# Patient Record
Sex: Female | Born: 1935 | Race: White | Hispanic: No | Marital: Married | Smoking: Never smoker
Health system: Southern US, Community
[De-identification: ages and names within clinical notes are randomized; demographics above are authoritative.]

## PROBLEM LIST (undated history)

## (undated) DIAGNOSIS — R32 Unspecified urinary incontinence: Secondary | ICD-10-CM

## (undated) DIAGNOSIS — R112 Nausea with vomiting, unspecified: Secondary | ICD-10-CM

## (undated) DIAGNOSIS — I10 Essential (primary) hypertension: Secondary | ICD-10-CM

## (undated) DIAGNOSIS — F419 Anxiety disorder, unspecified: Secondary | ICD-10-CM

## (undated) DIAGNOSIS — E039 Hypothyroidism, unspecified: Secondary | ICD-10-CM

## (undated) DIAGNOSIS — K274 Chronic or unspecified peptic ulcer, site unspecified, with hemorrhage: Secondary | ICD-10-CM

## (undated) DIAGNOSIS — Z9289 Personal history of other medical treatment: Secondary | ICD-10-CM

## (undated) DIAGNOSIS — G709 Myoneural disorder, unspecified: Secondary | ICD-10-CM

## (undated) DIAGNOSIS — M1990 Unspecified osteoarthritis, unspecified site: Secondary | ICD-10-CM

## (undated) DIAGNOSIS — N189 Chronic kidney disease, unspecified: Secondary | ICD-10-CM

## (undated) DIAGNOSIS — F329 Major depressive disorder, single episode, unspecified: Secondary | ICD-10-CM

## (undated) DIAGNOSIS — Z9889 Other specified postprocedural states: Secondary | ICD-10-CM

## (undated) DIAGNOSIS — F32A Depression, unspecified: Secondary | ICD-10-CM

## (undated) HISTORY — PX: NASAL POLYP EXCISION: SHX2068

## (undated) HISTORY — PX: JOINT REPLACEMENT: SHX530

## (undated) HISTORY — PX: SHOULDER SURGERY: SHX246

## (undated) HISTORY — PX: TONSILLECTOMY AND ADENOIDECTOMY: SUR1326

---

## 1998-04-15 ENCOUNTER — Ambulatory Visit (HOSPITAL_COMMUNITY): Admission: RE | Admit: 1998-04-15 | Discharge: 1998-04-15 | Payer: Self-pay | Admitting: *Deleted

## 1999-05-18 ENCOUNTER — Encounter: Payer: Self-pay | Admitting: *Deleted

## 1999-05-18 ENCOUNTER — Ambulatory Visit (HOSPITAL_COMMUNITY): Admission: RE | Admit: 1999-05-18 | Discharge: 1999-05-18 | Payer: Self-pay | Admitting: *Deleted

## 2000-05-21 ENCOUNTER — Encounter: Payer: Self-pay | Admitting: Family Medicine

## 2000-05-21 ENCOUNTER — Ambulatory Visit (HOSPITAL_COMMUNITY): Admission: RE | Admit: 2000-05-21 | Discharge: 2000-05-21 | Payer: Self-pay | Admitting: Family Medicine

## 2001-05-28 ENCOUNTER — Ambulatory Visit (HOSPITAL_COMMUNITY): Admission: RE | Admit: 2001-05-28 | Discharge: 2001-05-28 | Payer: Self-pay | Admitting: Family Medicine

## 2001-05-28 ENCOUNTER — Encounter: Payer: Self-pay | Admitting: Family Medicine

## 2001-09-30 ENCOUNTER — Other Ambulatory Visit: Admission: RE | Admit: 2001-09-30 | Discharge: 2001-09-30 | Payer: Self-pay | Admitting: Family Medicine

## 2003-01-19 LAB — HM COLONOSCOPY

## 2005-08-08 ENCOUNTER — Ambulatory Visit: Payer: Self-pay | Admitting: Family Medicine

## 2006-08-16 ENCOUNTER — Ambulatory Visit: Payer: Self-pay | Admitting: Family Medicine

## 2007-08-22 ENCOUNTER — Ambulatory Visit: Payer: Self-pay | Admitting: Family Medicine

## 2007-10-24 DIAGNOSIS — Z9289 Personal history of other medical treatment: Secondary | ICD-10-CM

## 2007-10-24 HISTORY — DX: Personal history of other medical treatment: Z92.89

## 2008-08-26 ENCOUNTER — Ambulatory Visit: Payer: Self-pay | Admitting: Family Medicine

## 2008-09-27 ENCOUNTER — Emergency Department: Payer: Self-pay | Admitting: Emergency Medicine

## 2008-10-12 ENCOUNTER — Ambulatory Visit: Payer: Self-pay | Admitting: Family Medicine

## 2008-10-13 ENCOUNTER — Inpatient Hospital Stay: Payer: Self-pay | Admitting: *Deleted

## 2008-10-13 DIAGNOSIS — K274 Chronic or unspecified peptic ulcer, site unspecified, with hemorrhage: Secondary | ICD-10-CM

## 2008-10-13 HISTORY — DX: Chronic or unspecified peptic ulcer, site unspecified, with hemorrhage: K27.4

## 2009-07-22 LAB — HM PAP SMEAR: HM Pap smear: NEGATIVE

## 2009-09-02 ENCOUNTER — Ambulatory Visit: Payer: Self-pay | Admitting: Family Medicine

## 2010-09-27 ENCOUNTER — Ambulatory Visit: Payer: Self-pay | Admitting: Family Medicine

## 2011-09-24 ENCOUNTER — Emergency Department: Payer: Self-pay | Admitting: Emergency Medicine

## 2011-10-31 ENCOUNTER — Ambulatory Visit: Payer: Self-pay | Admitting: Family Medicine

## 2012-07-03 ENCOUNTER — Encounter (HOSPITAL_COMMUNITY)
Admission: RE | Admit: 2012-07-03 | Discharge: 2012-07-03 | Disposition: A | Discharge status: Home / Self Care | Payer: Medicare Other | Source: Ambulatory Visit | Attending: Orthopedic Surgery | Admitting: Orthopedic Surgery

## 2012-07-03 ENCOUNTER — Encounter (HOSPITAL_COMMUNITY): Payer: Self-pay

## 2012-07-03 DIAGNOSIS — K449 Diaphragmatic hernia without obstruction or gangrene: Secondary | ICD-10-CM | POA: Insufficient documentation

## 2012-07-03 DIAGNOSIS — Z01818 Encounter for other preprocedural examination: Secondary | ICD-10-CM | POA: Insufficient documentation

## 2012-07-03 DIAGNOSIS — Z01812 Encounter for preprocedural laboratory examination: Secondary | ICD-10-CM | POA: Insufficient documentation

## 2012-07-03 DIAGNOSIS — Z01810 Encounter for preprocedural cardiovascular examination: Secondary | ICD-10-CM | POA: Insufficient documentation

## 2012-07-03 HISTORY — DX: Hypothyroidism, unspecified: E03.9

## 2012-07-03 HISTORY — DX: Myoneural disorder, unspecified: G70.9

## 2012-07-03 HISTORY — DX: Essential (primary) hypertension: I10

## 2012-07-03 HISTORY — DX: Nausea with vomiting, unspecified: R11.2

## 2012-07-03 HISTORY — DX: Other specified postprocedural states: Z98.890

## 2012-07-03 HISTORY — DX: Chronic or unspecified peptic ulcer, site unspecified, with hemorrhage: K27.4

## 2012-07-03 LAB — CBC
Hemoglobin: 15.5 g/dL — ABNORMAL HIGH (ref 12.0–15.0)
MCH: 30.3 pg (ref 26.0–34.0)
RBC: 5.12 MIL/uL — ABNORMAL HIGH (ref 3.87–5.11)
WBC: 11.1 10*3/uL — ABNORMAL HIGH (ref 4.0–10.5)

## 2012-07-03 LAB — BASIC METABOLIC PANEL
GFR calc Af Amer: 63 mL/min — ABNORMAL LOW (ref >90)
GFR calc non Af Amer: 55 mL/min — ABNORMAL LOW (ref >90)
Glucose, Bld: 88 mg/dL (ref 70–99)
Potassium: 4.9 mEq/L (ref 3.5–5.1)
Sodium: 139 mEq/L (ref 135–145)

## 2012-07-03 NOTE — Pre-Procedure Instructions (Signed)
20 [name]  07/03/2012   Your procedure is scheduled on:  Thursday July 11, 2012 at 0730 am  Report to [facility] at 0530 AM.  Call this number if you have problems the morning of surgery: [phone]   Remember:   Do not eat food or drink:After Midnight.Wednesday      Take these medicines the morning of surgery with A SIP OF WATER: Metoprolol   Do not wear jewelry, make-up or nail polish.  Do not wear lotions, powders, or perfumes. You may wear deodorant.  Do not shave 48 hours prior to surgery. Men may shave face and neck.  Do not bring valuables to the hospital.  Contacts, dentures or bridgework may not be worn into surgery.  Leave suitcase in the car. After surgery it may be brought to your room.  For patients admitted to the hospital, checkout time is 11:00 AM the day of discharge.   Patients discharged the day of surgery will not be allowed to drive home.    Special Instructions: Incentive Spirometry - Practice and bring it with you on the day of surgery. and CHG Shower Use Special Wash: 1/2 bottle night before surgery and 1/2 bottle morning of surgery.   Please read over the following fact sheets that you were given: Pain Booklet, Coughing and Deep Breathing, MRSA Information and Surgical Site Infection Prevention

## 2012-07-03 NOTE — Progress Notes (Signed)
Ralene Bathe, PA at Dr Rennis Chris office made aware that pt's daughter committed suicide last night ,she will not be coming by MD's office today as planned. French Ana PA stated would follow-up this PM with pt.

## 2012-07-03 NOTE — Progress Notes (Signed)
[name] Ana Shuford PA made aware of lump under pt's left arm. Stated to have pt  come to Dr. Rennis Chris office after [facility] visit.

## 2012-07-04 ENCOUNTER — Encounter (HOSPITAL_COMMUNITY): Payer: Self-pay | Admitting: Vascular Surgery

## 2012-07-11 ENCOUNTER — Encounter (HOSPITAL_COMMUNITY): Admission: RE | Payer: Self-pay | Source: Ambulatory Visit

## 2012-07-11 ENCOUNTER — Inpatient Hospital Stay (HOSPITAL_COMMUNITY): Admission: RE | Admit: 2012-07-11 | Payer: Medicare Other | Source: Ambulatory Visit | Admitting: Orthopedic Surgery

## 2012-07-11 SURGERY — ARTHROPLASTY, SHOULDER, TOTAL, REVERSE
Anesthesia: General | Site: Shoulder | Laterality: Left

## 2012-07-25 ENCOUNTER — Encounter (HOSPITAL_COMMUNITY): Payer: Self-pay | Admitting: Pharmacy Technician

## 2012-07-30 ENCOUNTER — Encounter (HOSPITAL_COMMUNITY): Payer: Self-pay

## 2012-07-30 ENCOUNTER — Encounter (HOSPITAL_COMMUNITY)
Admission: RE | Admit: 2012-07-30 | Discharge: 2012-07-30 | Disposition: A | Discharge status: Home / Self Care | Payer: Medicare Other | Source: Ambulatory Visit | Attending: Orthopedic Surgery | Admitting: Orthopedic Surgery

## 2012-07-30 HISTORY — DX: Unspecified osteoarthritis, unspecified site: M19.90

## 2012-07-30 NOTE — Progress Notes (Addendum)
Patients surgery rescheduled to 08/15/12 due to Dr. Rennis Chris not being available.  History updated 07/30/12, labs to be done day of surgery. Forwarded chart to anesthesia to review CXR from 07/03/12.

## 2012-07-30 NOTE — Consult Note (Signed)
Anesthesia Chart Review:  Patient is a 78 year old female posted for left reverse shoulder arthroplasty on 08/15/12 by Dr. Rennis Chris.   She was initially scheduled for this procedure, but was rescheduled because her daughter committed suicide.    History includes non-smoker, PUD '09, HTN, DM2 "borderline" not yet requiring medication, PNA, GERD, OA, headaches, hypothyroidism not yet requiring medication.  PCP is Dr. Lorie Phenix.  CXR on 07/03/12 showed: No acute infiltrate or pulmonary edema. Multiple calcified hilar and mediastinal lymph nodes are noted. Degenerative changes bilateral shoulders. Small to moderate sized hiatal hernia. Mild degenerative changes mid thoracic spine.   EKG on 07/03/12 showed NSR.  Her surgery was rescheduled from 08/08/12 to 08/15/12, but labs could not be done at her PAT appointment and still be within 14 days.  We will need to see if she can come in for a lab only visit unless Dr. Rennis Chris is okay with her getting labs on the day of surgery.  I'll ask our schedulers to clarify with Dr. Dub Mikes office tomorrow, since they are now closed.  Shonna Chock, PA-C 07/30/12 1742

## 2012-07-30 NOTE — Pre-Procedure Instructions (Signed)
20 [name]  07/30/2012   Your procedure is scheduled on:  Thursday August 08, 2012  Report to [facility] at 8:30 AM.  Call this number if you have problems the morning of surgery: [phone]   Remember:   Do not eat food or drink After Midnight.      Take these medicines the morning of surgery with A SIP OF WATER: metoprolol   Do not wear jewelry, make-up or nail polish.  Do not wear lotions, powders, or perfumes.   Do not shave 48 hours prior to surgery. Men may shave face and neck.  Do not bring valuables to the hospital.  Contacts, dentures or bridgework may not be worn into surgery.  Leave suitcase in the car. After surgery it may be brought to your room.  For patients admitted to the hospital, checkout time is 11:00 AM the day of discharge.   Patients discharged the day of surgery will not be allowed to drive home.  Name and phone number of your driver: family / friend  Special Instructions: Shower using CHG 2 nights before surgery and the night before surgery.  If you shower the day of surgery use CHG.  Use special wash - you have one bottle of CHG for all showers.  You should use approximately 1/3 of the bottle for each shower.   Please read over the following fact sheets that you were given: Pain Booklet, Coughing and Deep Breathing, Blood Transfusion Information, MRSA Information and Surgical Site Infection Prevention

## 2012-08-14 MED ORDER — DEXTROSE 5 % IV SOLN
3.0000 g | INTRAVENOUS | Status: DC
  Filled 2012-08-14: qty 3000

## 2012-08-14 MED ORDER — CEFAZOLIN SODIUM-DEXTROSE 2-3 GM-% IV SOLR
2.0000 g | INTRAVENOUS | Status: DC
  Filled 2012-08-14 (×2): qty 50

## 2012-08-15 ENCOUNTER — Inpatient Hospital Stay (HOSPITAL_COMMUNITY): Payer: Medicare Other | Admitting: Vascular Surgery

## 2012-08-15 ENCOUNTER — Encounter (HOSPITAL_COMMUNITY): Payer: Self-pay | Admitting: Vascular Surgery

## 2012-08-15 ENCOUNTER — Encounter (HOSPITAL_COMMUNITY): Payer: Self-pay | Admitting: *Deleted

## 2012-08-15 ENCOUNTER — Inpatient Hospital Stay (HOSPITAL_COMMUNITY)
Admission: RE | Admit: 2012-08-15 | Discharge: 2012-08-16 | DRG: 483 | Disposition: A | Discharge status: Home / Self Care | Payer: Medicare Other | Source: Ambulatory Visit | Attending: Orthopedic Surgery | Admitting: Orthopedic Surgery

## 2012-08-15 ENCOUNTER — Inpatient Hospital Stay (HOSPITAL_COMMUNITY): Payer: Medicare Other

## 2012-08-15 ENCOUNTER — Encounter (HOSPITAL_COMMUNITY)
Admission: RE | Disposition: A | Discharge status: Home / Self Care | Payer: Self-pay | Source: Ambulatory Visit | Attending: Orthopedic Surgery

## 2012-08-15 DIAGNOSIS — M67919 Unspecified disorder of synovium and tendon, unspecified shoulder: Secondary | ICD-10-CM | POA: Diagnosis present

## 2012-08-15 DIAGNOSIS — S42309S Unspecified fracture of shaft of humerus, unspecified arm, sequela: Secondary | ICD-10-CM

## 2012-08-15 DIAGNOSIS — M719 Bursopathy, unspecified: Secondary | ICD-10-CM | POA: Diagnosis present

## 2012-08-15 DIAGNOSIS — I10 Essential (primary) hypertension: Secondary | ICD-10-CM | POA: Diagnosis present

## 2012-08-15 DIAGNOSIS — M19019 Primary osteoarthritis, unspecified shoulder: Principal | ICD-10-CM | POA: Diagnosis present

## 2012-08-15 DIAGNOSIS — E119 Type 2 diabetes mellitus without complications: Secondary | ICD-10-CM | POA: Diagnosis present

## 2012-08-15 DIAGNOSIS — Z23 Encounter for immunization: Secondary | ICD-10-CM

## 2012-08-15 DIAGNOSIS — K219 Gastro-esophageal reflux disease without esophagitis: Secondary | ICD-10-CM | POA: Diagnosis present

## 2012-08-15 DIAGNOSIS — IMO0002 Reserved for concepts with insufficient information to code with codable children: Secondary | ICD-10-CM | POA: Diagnosis present

## 2012-08-15 DIAGNOSIS — X58XXXS Exposure to other specified factors, sequela: Secondary | ICD-10-CM

## 2012-08-15 DIAGNOSIS — E039 Hypothyroidism, unspecified: Secondary | ICD-10-CM | POA: Diagnosis present

## 2012-08-15 DIAGNOSIS — Z79899 Other long term (current) drug therapy: Secondary | ICD-10-CM

## 2012-08-15 HISTORY — PX: REVERSE SHOULDER ARTHROPLASTY: SHX5054

## 2012-08-15 HISTORY — DX: Personal history of other medical treatment: Z92.89

## 2012-08-15 LAB — CBC
MCH: 30.3 pg (ref 26.0–34.0)
Platelets: 263 10*3/uL (ref 150–400)
RBC: 5.22 MIL/uL — ABNORMAL HIGH (ref 3.87–5.11)
RDW: 13.3 % (ref 11.5–15.5)
WBC: 8.9 10*3/uL (ref 4.0–10.5)

## 2012-08-15 LAB — BASIC METABOLIC PANEL
Calcium: 9.7 mg/dL (ref 8.4–10.5)
GFR calc non Af Amer: 80 mL/min — ABNORMAL LOW (ref >90)
Sodium: 137 mEq/L (ref 135–145)

## 2012-08-15 LAB — TYPE AND SCREEN: ABO/RH(D): A POS

## 2012-08-15 SURGERY — ARTHROPLASTY, SHOULDER, TOTAL, REVERSE
Anesthesia: Regional | Site: Shoulder | Laterality: Left | Wound class: Clean

## 2012-08-15 MED ORDER — LIDOCAINE HCL (CARDIAC) 20 MG/ML IV SOLN
INTRAVENOUS | Status: DC | PRN
  Administered 2012-08-15: 70 mg via INTRAVENOUS

## 2012-08-15 MED ORDER — ALUM & MAG HYDROXIDE-SIMETH 200-200-20 MG/5ML PO SUSP: 30.0000 mL | ORAL | Status: DC | PRN

## 2012-08-15 MED ORDER — MUPIROCIN 2 % EX OINT
TOPICAL_OINTMENT | CUTANEOUS | Status: AC
  Filled 2012-08-15: qty 22

## 2012-08-15 MED ORDER — ACETAMINOPHEN 650 MG RE SUPP: 650.0000 mg | Freq: Four times a day (QID) | RECTAL | Status: DC | PRN

## 2012-08-15 MED ORDER — LACTATED RINGERS IV SOLN
INTRAVENOUS | Status: DC
  Administered 2012-08-15: 17:00:00 via INTRAVENOUS

## 2012-08-15 MED ORDER — FENTANYL CITRATE 0.05 MG/ML IJ SOLN
50.0000 ug | INTRAMUSCULAR | Status: AC | PRN
  Administered 2012-08-15 (×2): 50 ug via INTRAVENOUS

## 2012-08-15 MED ORDER — ONDANSETRON HCL 4 MG/2ML IJ SOLN: 4.0000 mg | Freq: Four times a day (QID) | INTRAMUSCULAR | Status: DC | PRN

## 2012-08-15 MED ORDER — PHENOL 1.4 % MT LIQD: 1.0000 | OROMUCOSAL | Status: DC | PRN

## 2012-08-15 MED ORDER — HYDROCHLOROTHIAZIDE 25 MG PO TABS
25.0000 mg | ORAL_TABLET | Freq: Every day | ORAL | Status: DC
  Administered 2012-08-15 – 2012-08-16 (×2): 25 mg via ORAL
  Filled 2012-08-15 (×2): qty 1

## 2012-08-15 MED ORDER — HYDROMORPHONE HCL PF 1 MG/ML IJ SOLN: 0.5000 mg | INTRAMUSCULAR | Status: DC | PRN

## 2012-08-15 MED ORDER — MIDAZOLAM HCL 2 MG/2ML IJ SOLN
INTRAMUSCULAR | Status: AC
  Filled 2012-08-15: qty 2

## 2012-08-15 MED ORDER — DOCUSATE SODIUM 100 MG PO CAPS
100.0000 mg | ORAL_CAPSULE | Freq: Two times a day (BID) | ORAL | Status: DC
  Administered 2012-08-15 – 2012-08-16 (×2): 100 mg via ORAL
  Filled 2012-08-15 (×3): qty 1

## 2012-08-15 MED ORDER — SCOPOLAMINE 1 MG/3DAYS TD PT72
MEDICATED_PATCH | TRANSDERMAL | Status: AC
  Filled 2012-08-15: qty 1

## 2012-08-15 MED ORDER — METOCLOPRAMIDE HCL 5 MG/ML IJ SOLN: 5.0000 mg | Freq: Three times a day (TID) | INTRAMUSCULAR | Status: DC | PRN

## 2012-08-15 MED ORDER — 0.9 % SODIUM CHLORIDE (POUR BTL) OPTIME
TOPICAL | Status: DC | PRN
  Administered 2012-08-15: 1000 mL

## 2012-08-15 MED ORDER — EPHEDRINE SULFATE 50 MG/ML IJ SOLN
INTRAMUSCULAR | Status: DC | PRN
  Administered 2012-08-15: 5 mg via INTRAVENOUS
  Administered 2012-08-15: 10 mg via INTRAVENOUS

## 2012-08-15 MED ORDER — ZOLPIDEM TARTRATE 5 MG PO TABS
5.0000 mg | ORAL_TABLET | Freq: Every evening | ORAL | Status: DC | PRN
  Administered 2012-08-15: 5 mg via ORAL
  Filled 2012-08-15: qty 1

## 2012-08-15 MED ORDER — TRAMADOL HCL 50 MG PO TABS: 50.0000 mg | ORAL_TABLET | Freq: Four times a day (QID) | ORAL | Status: DC | PRN

## 2012-08-15 MED ORDER — MUPIROCIN 2 % EX OINT
TOPICAL_OINTMENT | Freq: Two times a day (BID) | CUTANEOUS | Status: DC
  Filled 2012-08-15: qty 22

## 2012-08-15 MED ORDER — MIDAZOLAM HCL 2 MG/2ML IJ SOLN
1.0000 mg | INTRAMUSCULAR | Status: AC | PRN
  Administered 2012-08-15 (×2): 1 mg via INTRAVENOUS

## 2012-08-15 MED ORDER — PROPOFOL 10 MG/ML IV BOLUS
INTRAVENOUS | Status: DC | PRN
  Administered 2012-08-15: 140 mg via INTRAVENOUS

## 2012-08-15 MED ORDER — HYDROMORPHONE HCL PF 1 MG/ML IJ SOLN: 0.2500 mg | INTRAMUSCULAR | Status: DC | PRN

## 2012-08-15 MED ORDER — FENTANYL CITRATE 0.05 MG/ML IJ SOLN
INTRAMUSCULAR | Status: DC | PRN
  Administered 2012-08-15: 100 ug via INTRAVENOUS

## 2012-08-15 MED ORDER — DIPHENHYDRAMINE HCL 12.5 MG/5ML PO ELIX: 12.5000 mg | ORAL_SOLUTION | ORAL | Status: DC | PRN

## 2012-08-15 MED ORDER — INFLUENZA VIRUS VACC SPLIT PF IM SUSP
0.5000 mL | INTRAMUSCULAR | Status: AC
  Administered 2012-08-16: 0.5 mL via INTRAMUSCULAR
  Filled 2012-08-15: qty 0.5

## 2012-08-15 MED ORDER — TEMAZEPAM 15 MG PO CAPS: 15.0000 mg | ORAL_CAPSULE | Freq: Every evening | ORAL | Status: DC | PRN

## 2012-08-15 MED ORDER — METOCLOPRAMIDE HCL 10 MG PO TABS: 5.0000 mg | ORAL_TABLET | Freq: Three times a day (TID) | ORAL | Status: DC | PRN

## 2012-08-15 MED ORDER — DEXAMETHASONE SODIUM PHOSPHATE 4 MG/ML IJ SOLN
INTRAMUSCULAR | Status: DC | PRN
  Administered 2012-08-15: 4 mg via INTRAVENOUS

## 2012-08-15 MED ORDER — ROCURONIUM BROMIDE 100 MG/10ML IV SOLN
INTRAVENOUS | Status: DC | PRN
  Administered 2012-08-15: 50 mg via INTRAVENOUS

## 2012-08-15 MED ORDER — SODIUM CHLORIDE 0.9 % IV SOLN
10.0000 mg | INTRAVENOUS | Status: DC | PRN
  Administered 2012-08-15: 20 ug/min via INTRAVENOUS

## 2012-08-15 MED ORDER — FENTANYL CITRATE 0.05 MG/ML IJ SOLN
INTRAMUSCULAR | Status: AC
  Filled 2012-08-15: qty 2

## 2012-08-15 MED ORDER — LACTATED RINGERS IV SOLN
INTRAVENOUS | Status: DC | PRN
  Administered 2012-08-15 (×2): via INTRAVENOUS

## 2012-08-15 MED ORDER — ONDANSETRON HCL 4 MG/2ML IJ SOLN
INTRAMUSCULAR | Status: DC | PRN
  Administered 2012-08-15: 4 mg via INTRAVENOUS

## 2012-08-15 MED ORDER — SCOPOLAMINE 1 MG/3DAYS TD PT72
1.0000 | MEDICATED_PATCH | TRANSDERMAL | Status: DC
  Administered 2012-08-15: 1.5 mg via TRANSDERMAL

## 2012-08-15 MED ORDER — ONDANSETRON HCL 4 MG/2ML IJ SOLN
INTRAMUSCULAR | Status: AC
  Filled 2012-08-15: qty 2

## 2012-08-15 MED ORDER — LACTATED RINGERS IV SOLN
INTRAVENOUS | Status: DC
  Administered 2012-08-15: 10:00:00 via INTRAVENOUS

## 2012-08-15 MED ORDER — ACETAMINOPHEN 325 MG PO TABS: 650.0000 mg | ORAL_TABLET | Freq: Four times a day (QID) | ORAL | Status: DC | PRN

## 2012-08-15 MED ORDER — METOPROLOL TARTRATE 25 MG PO TABS
25.0000 mg | ORAL_TABLET | Freq: Two times a day (BID) | ORAL | Status: DC
  Administered 2012-08-15 – 2012-08-16 (×2): 25 mg via ORAL
  Filled 2012-08-15 (×3): qty 1

## 2012-08-15 MED ORDER — BUPIVACAINE-EPINEPHRINE PF 0.5-1:200000 % IJ SOLN
INTRAMUSCULAR | Status: DC | PRN
  Administered 2012-08-15: 30 mL

## 2012-08-15 MED ORDER — ONDANSETRON HCL 4 MG/2ML IJ SOLN
4.0000 mg | Freq: Once | INTRAMUSCULAR | Status: AC
  Administered 2012-08-15: 4 mg via INTRAVENOUS

## 2012-08-15 MED ORDER — CEFAZOLIN SODIUM 1-5 GM-% IV SOLN
1.0000 g | Freq: Four times a day (QID) | INTRAVENOUS | Status: AC
  Administered 2012-08-15 – 2012-08-16 (×3): 1 g via INTRAVENOUS
  Filled 2012-08-15 (×3): qty 50

## 2012-08-15 MED ORDER — KETOROLAC TROMETHAMINE 15 MG/ML IJ SOLN
15.0000 mg | Freq: Four times a day (QID) | INTRAMUSCULAR | Status: DC
  Administered 2012-08-15 – 2012-08-16 (×3): 15 mg via INTRAVENOUS
  Filled 2012-08-15 (×8): qty 1

## 2012-08-15 MED ORDER — CHLORHEXIDINE GLUCONATE 4 % EX LIQD: 60.0000 mL | Freq: Once | CUTANEOUS | Status: DC

## 2012-08-15 MED ORDER — HYDROMORPHONE HCL 2 MG PO TABS
2.0000 mg | ORAL_TABLET | ORAL | Status: DC | PRN
  Administered 2012-08-15 – 2012-08-16 (×3): 2 mg via ORAL
  Filled 2012-08-15 (×3): qty 1

## 2012-08-15 MED ORDER — MENTHOL 3 MG MT LOZG: 1.0000 | LOZENGE | OROMUCOSAL | Status: DC | PRN

## 2012-08-15 MED ORDER — ONDANSETRON HCL 4 MG PO TABS: 4.0000 mg | ORAL_TABLET | Freq: Four times a day (QID) | ORAL | Status: DC | PRN

## 2012-08-15 SURGICAL SUPPLY — 70 items
BLADE SAW SGTL 83.5X18.5 (BLADE) ×2 IMPLANT
BRUSH FEMORAL CANAL (MISCELLANEOUS) IMPLANT
CABLE CR CO 1.8MM 36L (Orthopedic Implant) ×4 IMPLANT
CEMENT BONE DEPUY (Cement) ×4 IMPLANT
CEMENT RESTRICTOR DEPUY SZ 1 (Cement) IMPLANT
CEMENT RESTRICTOR DEPUY SZ 3 (Cement) ×2 IMPLANT
CLOTH BEACON ORANGE TIMEOUT ST (SAFETY) ×2 IMPLANT
CLSR STERI-STRIP ANTIMIC 1/2X4 (GAUZE/BANDAGES/DRESSINGS) ×2 IMPLANT
COVER SURGICAL LIGHT HANDLE (MISCELLANEOUS) ×2 IMPLANT
DRAPE INCISE IOBAN 66X45 STRL (DRAPES) ×2 IMPLANT
DRAPE SURG 17X11 SM STRL (DRAPES) ×2 IMPLANT
DRAPE U-SHAPE 47X51 STRL (DRAPES) ×2 IMPLANT
DRILL BIT 7/64X5 (BIT) ×2 IMPLANT
DRSG AQUACEL AG ADV 3.5X10 (GAUZE/BANDAGES/DRESSINGS) ×2 IMPLANT
DRSG MEPILEX BORDER 4X8 (GAUZE/BANDAGES/DRESSINGS) ×2 IMPLANT
DURAPREP 26ML APPLICATOR (WOUND CARE) ×2 IMPLANT
ELECT BLADE 4.0 EZ CLEAN MEGAD (MISCELLANEOUS) ×2
ELECT CAUTERY BLADE 6.4 (BLADE) ×2 IMPLANT
ELECT REM PT RETURN 9FT ADLT (ELECTROSURGICAL) ×2
ELECTRODE BLDE 4.0 EZ CLN MEGD (MISCELLANEOUS) ×1 IMPLANT
ELECTRODE REM PT RTRN 9FT ADLT (ELECTROSURGICAL) ×1 IMPLANT
FACESHIELD LNG OPTICON STERILE (SAFETY) ×6 IMPLANT
GLOVE BIO SURGEON STRL SZ7.5 (GLOVE) ×2 IMPLANT
GLOVE BIO SURGEON STRL SZ8 (GLOVE) ×2 IMPLANT
GLOVE BIO SURGEON STRL SZ8.5 (GLOVE) ×2 IMPLANT
GLOVE BIOGEL PI IND STRL 8.5 (GLOVE) ×1 IMPLANT
GLOVE BIOGEL PI INDICATOR 8.5 (GLOVE) ×1
GLOVE EUDERMIC 7 POWDERFREE (GLOVE) ×2 IMPLANT
GLOVE SS BIOGEL STRL SZ 7.5 (GLOVE) ×1 IMPLANT
GLOVE SUPERSENSE BIOGEL SZ 7.5 (GLOVE) ×1
GOWN PREVENTION PLUS XLARGE (GOWN DISPOSABLE) ×2 IMPLANT
GOWN PREVENTION PLUS XXLARGE (GOWN DISPOSABLE) ×4 IMPLANT
GOWN STRL NON-REIN LRG LVL3 (GOWN DISPOSABLE) IMPLANT
GOWN STRL REIN XL XLG (GOWN DISPOSABLE) ×4 IMPLANT
HANDPIECE INTERPULSE COAX TIP (DISPOSABLE)
KIT BASIN OR (CUSTOM PROCEDURE TRAY) ×2 IMPLANT
KIT ROOM TURNOVER OR (KITS) ×2 IMPLANT
MANIFOLD NEPTUNE II (INSTRUMENTS) ×2 IMPLANT
NDL SUT 6 .5 CRC .975X.05 MAYO (NEEDLE) IMPLANT
NEEDLE HYPO 25GX1X1/2 BEV (NEEDLE) IMPLANT
NEEDLE MAYO TAPER (NEEDLE)
NS IRRIG 1000ML POUR BTL (IV SOLUTION) ×2 IMPLANT
PACK SHOULDER (CUSTOM PROCEDURE TRAY) ×2 IMPLANT
PAD ARMBOARD 7.5X6 YLW CONV (MISCELLANEOUS) ×4 IMPLANT
PASSER SUT SWANSON 36MM LOOP (INSTRUMENTS) IMPLANT
PRESSURIZER FEMORAL UNIV (MISCELLANEOUS) IMPLANT
SET HNDPC FAN SPRY TIP SCT (DISPOSABLE) IMPLANT
SLING ARM FOAM STRAP MED (SOFTGOODS) ×2 IMPLANT
SLING ARM IMMOBILIZER LRG (SOFTGOODS) ×2 IMPLANT
SPONGE LAP 18X18 X RAY DECT (DISPOSABLE) ×4 IMPLANT
SPONGE LAP 4X18 X RAY DECT (DISPOSABLE) IMPLANT
STRIP CLOSURE SKIN 1/2X4 (GAUZE/BANDAGES/DRESSINGS) ×2 IMPLANT
SUCTION FRAZIER TIP 10 FR DISP (SUCTIONS) ×2 IMPLANT
SUT BONE WAX W31G (SUTURE) IMPLANT
SUT FIBERWIRE #2 38 T-5 BLUE (SUTURE)
SUT MNCRL AB 3-0 PS2 18 (SUTURE) ×4 IMPLANT
SUT VIC AB 1 CT1 27 (SUTURE) ×3
SUT VIC AB 1 CT1 27XBRD ANBCTR (SUTURE) ×3 IMPLANT
SUT VIC AB 2-0 CT1 27 (SUTURE) ×2
SUT VIC AB 2-0 CT1 TAPERPNT 27 (SUTURE) ×2 IMPLANT
SUT VIC AB 2-0 SH 27 (SUTURE)
SUT VIC AB 2-0 SH 27X BRD (SUTURE) IMPLANT
SUTURE FIBERWR #2 38 T-5 BLUE (SUTURE) IMPLANT
SYR 30ML SLIP (SYRINGE) ×2 IMPLANT
SYR CONTROL 10ML LL (SYRINGE) IMPLANT
TOWEL OR 17X24 6PK STRL BLUE (TOWEL DISPOSABLE) ×2 IMPLANT
TOWEL OR 17X26 10 PK STRL BLUE (TOWEL DISPOSABLE) ×2 IMPLANT
TOWER CARTRIDGE SMART MIX (DISPOSABLE) IMPLANT
TRAY FOLEY CATH 14FR (SET/KITS/TRAYS/PACK) IMPLANT
WATER STERILE IRR 1000ML POUR (IV SOLUTION) ×2 IMPLANT

## 2012-08-15 NOTE — Anesthesia Preprocedure Evaluation (Addendum)
Anesthesia Evaluation  Patient identified by MRN, date of birth, ID band Patient awake    Reviewed: Allergy & Precautions, H&P , NPO status , Patient's Chart, lab work & pertinent test results  History of Anesthesia Complications (+) PONV  Airway Mallampati: II  Neck ROM: full    Dental   Pulmonary          Cardiovascular hypertension,     Neuro/Psych  Headaches,  Neuromuscular disease    GI/Hepatic PUD, GERD-  ,  Endo/Other  diabetes, Type 2Hypothyroidism   Renal/GU      Musculoskeletal   Abdominal   Peds  Hematology   Anesthesia Other Findings   Reproductive/Obstetrics                           Anesthesia Physical Anesthesia Plan  ASA: III  Anesthesia Plan: General and Regional   Post-op Pain Management: MAC Combined w/ Regional for Post-op pain   Induction: Intravenous  Airway Management Planned: Oral ETT  Additional Equipment:   Intra-op Plan:   Post-operative Plan: Extubation in OR  Informed Consent: I have reviewed the patients History and Physical, chart, labs and discussed the procedure including the risks, benefits and alternatives for the proposed anesthesia with the patient or authorized representative who has indicated his/her understanding and acceptance.     Plan Discussed with: CRNA and Surgeon  Anesthesia Plan Comments:        Anesthesia Quick Evaluation

## 2012-08-15 NOTE — Progress Notes (Signed)
UR COMPLETED  

## 2012-08-15 NOTE — Anesthesia Postprocedure Evaluation (Signed)
Anesthesia Post Note  Patient: [name]  Procedure(s) Performed: Procedure(s) (LRB): REVERSE SHOULDER ARTHROPLASTY (Left)  Anesthesia type: General  Patient location: PACU  Post pain: Pain level controlled and Adequate analgesia  Post assessment: Post-op Vital signs reviewed, Patient's Cardiovascular Status Stable, Respiratory Function Stable, Patent Airway and Pain level controlled  Last Vitals:  Filed Vitals:   08/15/12 1415  BP: 168/80  Pulse: 81  Temp: 37 C  Resp: 21    Post vital signs: Reviewed and stable  Level of consciousness: awake, alert  and oriented  Complications: No apparent anesthesia complications

## 2012-08-15 NOTE — Op Note (Signed)
08/15/2012  1:25 PM  PATIENT:   [name]  78 y.o. female  PRE-OPERATIVE DIAGNOSIS:  Osteoarthritis left shoulder   POST-OPERATIVE DIAGNOSIS:  Same with associated chronic rotator cuff tear and malunited proximal humerus fracture  PROCEDURE:  Left shoulder reverse arthroplasty #10 cemented stem, +6 poly, 38 eccentric glenosphere  SURGEON:  Rocio Roam, Vania Rea M.D.  ASSISTANTS: Shuford pac   ANESTHESIA:   GET + ISB  EBL: 250  SPECIMEN:  none  Drains: none   PATIENT DISPOSITION:  PACU - hemodynamically stable.    PLAN OF CARE: Admit to inpatient   Dictation# [id number]

## 2012-08-15 NOTE — Transfer of Care (Signed)
Immediate Anesthesia Transfer of Care Note  Patient: [name]  Procedure(s) Performed: Procedure(s) (LRB) with comments: REVERSE SHOULDER ARTHROPLASTY (Left)  Patient Location: PACU  Anesthesia Type: General  Level of Consciousness: awake, alert  and oriented  Airway & Oxygen Therapy: Patient Spontanous Breathing and Patient connected to nasal cannula oxygen  Post-op Assessment: Report given to PACU RN, Post -op Vital signs reviewed and stable and Patient moving all extremities  Post vital signs: Reviewed and stable  Complications: No apparent anesthesia complications

## 2012-08-15 NOTE — H&P (Signed)
[name] [phone]    Chief Complaint: OA left Shoulder  HPI: The patient is a 78 y.o. female with end stage left shoulder arthritis and rotator cuff tear arthropathy  Past Medical History  Diagnosis Date  . PONV (postoperative nausea and vomiting)     severe  . Peptic ulcer disease with hemorrhage 10/13/2008    admitted to hospital .had 4 blood transfusions  . Hypertension   . Hypothyroidism     not on meds at present. MD watching  . Neuromuscular disorder     left knee  . Pneumonia     hx of  . Diabetes mellitus without complication     "borderline, not on medication"  . GERD (gastroesophageal reflux disease)     hx of  . Headache     hx of  . Arthritis   . Chronic neck pain     Past Surgical History  Procedure Date  . Bone spur 2004    bone spur removed from left shoulder  . Tonsillectomy     History reviewed. No pertinent family history.  Social History:  reports that she has never smoked. She does not have any smokeless tobacco history on file. She reports that she drinks alcohol. She reports that she does not use illicit drugs.  Allergies:  Allergies  Allergen Reactions  . Morphine And Related Other (See Comments)    Makes me crazy  . Scallops (Shellfish Allergy) Nausea And Vomiting    Able to eat shrimp and other shellfish  . Vicodin (Hydrocodone-Acetaminophen) Nausea Only    Medications Prior to Admission  Medication Sig Dispense Refill  . alendronate (FOSAMAX) 70 MG tablet Take 70 mg by mouth every 7 (seven) days. Wednesdays.  Take with a full glass of water on an empty stomach.      . Calcium Carbonate-Vitamin D (CALCIUM + D PO) Take 1 tablet by mouth daily.      . cholecalciferol (VITAMIN D) 1000 UNITS tablet Take 1,000 Units by mouth daily.      . Cyanocobalamin (VITAMIN B12 PO) Take 1 tablet by mouth daily.      Marland Kitchen FOLIC ACID PO Take by mouth.      . hydrochlorothiazide (HYDRODIURIL) 25 MG tablet Take 25 mg by mouth daily.      Marland Kitchen LYSINE PO Take 1 tablet  by mouth every other day.       . meloxicam (MOBIC) 15 MG tablet Take 15 mg by mouth daily.      . metoprolol tartrate (LOPRESSOR) 25 MG tablet Take 25 mg by mouth 2 (two) times daily.      . Multiple Vitamins-Minerals (MULTIVITAMIN WITH MINERALS) tablet Take 1 tablet by mouth daily.      Marland Kitchen NIACIN PO Take 1 tablet by mouth daily.      . vitamin C (ASCORBIC ACID) 500 MG tablet Take 500 mg by mouth daily.         Physical Exam: left shoulder with painful and restricted motion as documented at 05/13/12 office visit  Vitals  Temp:  [98.1 F (36.7 C)] 98.1 F (36.7 C) (10/24 0856) Pulse Rate:  [77] 77  (10/24 0856) Resp:  [16] 16  (10/24 0856) BP: (175)/(91) 175/91 mmHg (10/24 0856) SpO2:  [95 %] 95 % (10/24 0856)  Assessment/Plan  Impression: OA left Shoulder   Plan of Action: Procedure(s): REVERSE SHOULDER ARTHROPLASTY  Adon Gehlhausen M 08/15/2012, 9:44 AM

## 2012-08-15 NOTE — Anesthesia Procedure Notes (Addendum)
Anesthesia Regional Block:  Interscalene brachial plexus block  Pre-Anesthetic Checklist: ,, timeout performed, Correct Patient, Correct Site, Correct Laterality, Correct Procedure, Correct Position, site marked, Risks and benefits discussed,  Surgical consent,  Pre-op evaluation,  At surgeon's request and post-op pain management  Laterality: Left  Prep: chloraprep       Needles:  Injection technique: Single-shot  Needle Type: Echogenic Stimulator Needle     Needle Length: 5cm 5 cm Needle Gauge: 22 and 22 G    Additional Needles:  Procedures: ultrasound guided and nerve stimulator Interscalene brachial plexus block  Nerve Stimulator or Paresthesia:  Response: biceps flexion, 0.45 mA,   Additional Responses:   Narrative:  Start time: 08/15/2012 10:02 AM End time: 08/15/2012 11:14 AM Injection made incrementally with aspirations every 5 mL.  Performed by: Personally  Anesthesiologist: Dr Chaney Malling  Additional Notes: Functioning IV was confirmed and monitors were applied.  A 50mm 22ga Arrow echogenic stimulator needle was used. Sterile prep and drape,hand hygiene and sterile gloves were used.  Negative aspiration and negative test dose prior to incremental administration of local anesthetic. The patient tolerated the procedure well.  Ultrasound guidance: relevent anatomy identified, needle position confirmed, local anesthetic spread visualized around nerve(s), vascular puncture avoided.  Image printed for medical record.   Interscalene brachial plexus block Procedure Name: Intubation Date/Time: 08/15/2012 10:52 AM Performed by: Rossie Muskrat L Pre-anesthesia Checklist: Patient identified, Timeout performed, Emergency Drugs available, Suction available and Patient being monitored Patient Re-evaluated:Patient Re-evaluated prior to inductionOxygen Delivery Method: Circle system utilized Preoxygenation: Pre-oxygenation with 100% oxygen Intubation Type: IV  induction Ventilation: Mask ventilation without difficulty Laryngoscope Size: Miller and 2 Grade View: Grade I Tube type: Oral Tube size: 8.0 mm Number of attempts: 1 Airway Equipment and Method: Stylet Placement Confirmation: ETT inserted through vocal cords under direct vision,  breath sounds checked- equal and bilateral and positive ETCO2 Secured at: 20 cm Tube secured with: Tape Dental Injury: Teeth and Oropharynx as per pre-operative assessment

## 2012-08-15 NOTE — Progress Notes (Signed)
Initial PCR date 07/03/12. Patient reports due to rescheduling issues patient did not start Mupirocin until October 8 and finished October 13. Per Seabron Spates in infection prevention do not redo PCR.

## 2012-08-15 NOTE — Preoperative (Signed)
Beta Blockers   Reason not to administer Beta Blockers:B blocker taken @0700  08/15/12

## 2012-08-16 MED ORDER — HYDROMORPHONE HCL 2 MG PO TABS: 2.0000 mg | ORAL_TABLET | ORAL | Status: DC | PRN

## 2012-08-16 MED ORDER — TRAMADOL HCL 50 MG PO TABS: 50.0000 mg | ORAL_TABLET | Freq: Four times a day (QID) | ORAL | Status: DC | PRN

## 2012-08-16 MED ORDER — DIAZEPAM 5 MG PO TABS: 5.0000 mg | ORAL_TABLET | Freq: Four times a day (QID) | ORAL | Status: DC | PRN

## 2012-08-16 NOTE — Evaluation (Signed)
Occupational Therapy Evaluation Patient Details Name: [name] MRN: [id number] DOB: 02-26-1934 Today's Date: 08/16/2012 Time: 7829-5621 OT Time Calculation (min): 44 min  OT Assessment / Plan / Recommendation Clinical Impression  Pt. 78 yo female s/p reverse TSA. Pt is independent with shoulder exercises and pendulums. Requires min (A) to don brace but able to verbalize to caregiver how to (A). Pt husband will be available to (A) once home.     OT Assessment  Patient does not need any further OT services    Follow Up Recommendations  No OT follow up    Barriers to Discharge      Equipment Recommendations  None recommended by OT    Recommendations for Other Services    Frequency       Precautions / Restrictions Restrictions Weight Bearing Restrictions: Yes   Pertinent Vitals/Pain No pain     ADL  Eating/Feeding: Performed;Independent Where Assessed - Eating/Feeding: Chair Grooming: Performed;Teeth care;Supervision/safety Where Assessed - Grooming: Unsupported standing Upper Body Bathing: Performed;Minimal assistance Where Assessed - Upper Body Bathing: Unsupported sitting Lower Body Dressing: Performed;Minimal assistance Where Assessed - Lower Body Dressing: Unsupported sit to stand Toilet Transfer: Simulated;Supervision/safety Toilet Transfer Method: Sit to Barista: Raised toilet seat without arms Equipment Used: Gait belt Transfers/Ambulation Related to ADLs: Supervision for safety, states she has unsteady knees ADL Comments: Educated on sling wear and how to don/doff. Requires min (A) to don brace, but able to verbalize to caregiver how to (A). Pt is independent to complete shoulder exercises and pendulums. Educated on how to dress UB and shower under LUE. Completed grooming tasks at sink level with supervision. Husband is retired and available everyday to (A)     OT Diagnosis:    OT Problem List:   OT Treatment Interventions:     OT  Goals    Visit Information  Last OT Received On: 08/16/12 Assistance Needed: +1    Subjective Data  Subjective: I am ready to be able to use this arm again  Patient Stated Goal: To go home today    Prior Functioning     Home Living Lives With: Spouse Available Help at Discharge: Family Type of Home: House Home Access: Stairs to enter Secretary/administrator of Steps: 4 Entrance Stairs-Rails: Can reach both Home Layout: One level Bathroom Shower/Tub: Health visitor: Handicapped height Prior Function Level of Independence: Independent Able to Take Stairs?: Yes Driving: Yes Vocation: Agricultural consultant work Musician: No difficulties Dominant Hand: Right         Vision/Perception     Cognition  Overall Cognitive Status: Appears within functional limits for tasks assessed/performed Arousal/Alertness: Awake/alert Orientation Level: Appears intact for tasks assessed Behavior During Session: [facility] for tasks performed    Extremity/Trunk Assessment Trunk Assessment Trunk Assessment: Normal     Mobility Bed Mobility Bed Mobility: Supine to Sit;Sitting - Scoot to Edge of Bed Supine to Sit: 5: Supervision Sitting - Scoot to Edge of Bed: 5: Supervision Details for Bed Mobility Assistance: vc's for sequencing. Pt. states she plans to sleep in recliner for first few days at home.  Transfers Transfers: Sit to Stand;Stand to Sit Sit to Stand: 5: Supervision;From bed;With upper extremity assist Stand to Sit: 5: Supervision;To chair/3-in-1;With armrests;With upper extremity assist     Shoulder Instructions Donning/doffing shirt without moving shoulder: Patient able to independently direct caregiver;Supervision/safety Method for sponge bathing under operated UE: Minimal assistance;Patient able to independently direct caregiver Donning/doffing sling/immobilizer: Minimal assistance;Patient able to independently direct caregiver Correct  positioning of  sling/immobilizer: Independent Pendulum exercises (written home exercise program): Independent ROM for elbow, wrist and digits of operated UE: Independent Sling wearing schedule (on at all times/off for ADL's): Independent Proper positioning of operated UE when showering: Independent Positioning of UE while sleeping: Independent   Exercise Shoulder Exercises Pendulum Exercise: AAROM;Left;10 reps;Standing Shoulder Flexion: AAROM;Left;10 reps;Supine Shoulder ABduction: AAROM;Left;10 reps;Supine Shoulder External Rotation: AAROM;Left;10 reps;Supine Elbow Flexion: AROM;Left;Seated Elbow Extension: AROM;Left;Seated Wrist Flexion: AROM;Left;Seated Wrist Extension: AROM;Left;Seated Digit Composite Flexion: AROM;Left;Seated Composite Extension: AROM;Left;Seated Neck Flexion: AROM Neck Extension: AROM Neck Lateral Flexion - Right: AROM Neck Lateral Flexion - Left: AROM   Balance     End of Session OT - End of Session Equipment Utilized During Treatment: Gait belt Activity Tolerance: Patient tolerated treatment well Patient left: in chair;with call bell/phone within reach Nurse Communication: Mobility status  GO     Cleora Fleet 08/16/2012, 9:07 AM

## 2012-08-16 NOTE — Progress Notes (Signed)
Occupational Therapy Discharge Patient Details Name: [name] MRN: [id number] DOB: Mar 16, 1934 Today's Date: 08/16/2012 Time: 4098-1191 OT Time Calculation (min): 44 min  Patient discharged from OT services secondary to Pt is independent with shoulder exercises and pendulums. Educated on sling wear and how to don/doff. Requires min (A) to don sling but able to verbalize to caregiver how to (A). Pt husband will be available to (A) at home. No further acute OT needs .  Please see latest therapy progress note for current level of functioning and progress toward goals.    Progress and discharge plan discussed with patient and/or caregiver: Patient/Caregiver agrees with plan  GO     Cleora Fleet 08/16/2012, 9:07 AM

## 2012-08-16 NOTE — Evaluation (Signed)
I agree with the following treatment note after reviewing documentation.   Johnston, Janoah Menna Brynn   OTR/L Pager: [phone] Office: [phone] .   

## 2012-08-16 NOTE — Progress Notes (Signed)
I agree with the following treatment note after reviewing documentation.   Johnston, Emilina Smarr Brynn   OTR/L Pager: [phone] Office: [phone] .   

## 2012-08-16 NOTE — Op Note (Signed)
NAMEAHLANA, [name] NO.:  [id number]  MEDICAL RECORD NO.:  [id number]  LOCATION:  5N02C                        FACILITY:  [facility]  PHYSICIAN:  Vania Rea. Haelee Bolen, M.D.  DATE OF BIRTH:  Aug 01, 1934  DATE OF PROCEDURE:  08/15/2012 DATE OF DISCHARGE:                              OPERATIVE REPORT   PREOPERATIVE DIAGNOSIS:  End-stage left shoulder osteoarthrosis with underlying chronic rotator cuff tear and a malunited proximal humerus fracture.  POSTOPERATIVE DIAGNOSIS:  End-stage left shoulder osteoarthrosis with underlying chronic rotator cuff tear and a malunited proximal humerus fracture.  PROCEDURE:  Left shoulder reverse arthroplasty utilizing a cemented size 10 DePuy stem, +6 polyethylene insert, and a 38 eccentric glenosphere.  SURGEON:  Vania Rea. Hyun Reali, M.D.  Threasa HeadsFrench Ana A. Shuford, PA-C  ANESTHESIA:  General endotracheal.  ESTIMATED BLOOD LOSS:  150 mL.  DRAINS:  None.  HISTORY:  [name] is a 78 year old female who has had long-standing difficulties with left shoulder pain as well as restricted mobility. She had a previous open rotator cuff repair and also sustained a proximal humerus fracture with persistent malunion.  I suspect also one onto an element of avascular necrosis and now presents with a severely painful and markedly stiff shoulder with radiographs showing severe deformity of the glenohumeral joint collapsing the humeral head, peripheral osteophyte formation.  Due to her ongoing pain and functional limitations, she is brought to the operating room at this time for planned left shoulder reverse arthroplasty.  Preoperatively, counseled [name] on treatment options as well as risks versus benefits thereof.  Possible surgical complications were reviewed including the potential for bleeding, infection, neurovascular injury, persistent pain, loss of motion, anesthetic complication, failure of the implant, possible need for  additional surgery.  She understands and accepts and agrees with our planned procedure.  PROCEDURE IN DETAIL:  After undergoing routine preop evaluation, the patient received prophylactic antibiotics, and an interscalene block was established in the holding area by the Anesthesia Department.  Placed supine on the operating table, underwent smooth induction of a general endotracheal anesthesia.  Placed in beach-chair position and appropriately padded and protected.  The right shoulder girdle region was then sterilely prepped and draped in standard fashion.  Time-out was called.  An anterior deltopectoral approach was made through an incision approximately 15 cm in length.  Skin flaps were elevated. Electrocautery used to obtain hemostasis.  Deltopectoral interval was identified and developed, proximal to distal cephalic vein was retracted laterally with the deltoid.  The biceps tendon was tenotomized.  The conjoined tendon was mobilized and retracted medially.  Adhesions throughout the subacromial/subdeltoid bursal region were all divided and excised.  We then divided the remnants of the subscapularis away from the lesser tuberosity and found a very large osteophytes anteriorly and inferiorly, which completely included the axillary space.  We very carefully dissected this osteophyte free protecting the context of the axillary pouch, and then removed the osteophyte inferiorly with an osteotome.  With this completed, we then removed the remnant of the rotator cuff posteriorly to allow complete delivery of the humeral head to the wound, and again there was severe deformity of the head with markedly flattened articular surface  and eburnated bone.  With the proximal humerus exposed then gained access to the humeral medullary canal, and due to the previous malunion of the proximal humerus fracture, there was a slight varus deformity, and our access to the canal was very close to the lateral  aspect of the greater tuberosity. We then reamed up to size 12 and then with the extramedullary guide, made our initial humeral head resection at 0 degrees retroversion.  At this point, we then placed a metal cap across the cut surface of the proximal humerus and then performed our glenoid exposure with combination of pitchfork, Fukuda, and snake tongue retractors.  We performed circumferential labral resection and capsular release to gain complete exposure of the glenoid.  This was again markedly deformed again with eburnated bone.  A guide pin was then placed in the center of the glenoid, and this was then reamed to a smooth subchondral bony bed. A central drill hole was made , and then our base plate was impacted into position.  We then placed the inferior, superior, and posterior locking screws obtaining good bony purchase.  A 38 eccentric glenosphere was then placed and tightened to our satisfaction.  We then returned our attention to the proximal humerus and completed the preparation with the 12 stem and at this point, we found that the 12 stem actually violated the cortex of the proximal lateral humerus and given this fracture line, we went ahead and placed a cerclage wire at the metaphyseal diaphyseal junction, the proximal humerus to protect the humerus and prevent propagation of this fracture line distally and once this cerclage wire was placed, we then completed the preparation of the proximal humerus. We did indeed need to resect additional bone to get the humeral implant down to the appropriate level.  This was all completed and finally, we elected to use the size 10 humeral stem, and with the trial reductions, we found appropriate soft tissue balance.  At this point, we then placed a distal cement plug into the humerus, irrigated the canal, prepared cement, then introduced cement into the canal in retrograde fashion and decompressed this and then placed our final size 10 stem  at approximately 0 degrees of retroversion and removed all extra cement meticulously.  Once the cement had hardened, we then performed final trial reductions and the +6 poly cup had the best soft tissue balance. We performed final tightening up of cerclage wire and terminally clamped the wire and removed the excess wire and then performed our final reduction with the +6 poly and again overall, the soft tissue balance was much to our satisfaction with excellent shoulder motion and stability.  The wound was then irrigated.  Hemostasis was obtained.  We did find that the cephalic vein appeared to be markedly attenuated and was not in continuity proximally and it was electrocauterized.  The deltopectoral interval was then reapproximated with 0 Vicryl figure-of- eight sutures. 2-0 Vicryl with the subcu layer, and intracuticular 3-0 Monocryl for the skin followed by Steri-Strips.  Dry dressings were then applied, and the left arm was placed in a sling.  Kennith Center, PA-C was used as an Geophysicist/field seismologist throughout this case, essential for help with positioning of the extremity, retraction, exposure of bone manipulation, implant manipulation, wound closure, and intraoperative decision making.  At this point, the patient was awakened, extubated, and taken to the recovery room in stable condition.     Vania Rea. Analina Filla, M.D.     KMS/MEDQ  D:  08/15/2012  T:  08/16/2012  Job:  [id number]

## 2012-08-16 NOTE — Discharge Summary (Signed)
PATIENT ID:      [name]  MRN:     [id number] DOB/AGE:    02/20/1934 / 78 y.o.     DISCHARGE SUMMARY  ADMISSION DATE:    08/15/2012 DISCHARGE DATE: 08/16/2012   ADMISSION DIAGNOSIS: Osteoarthritis left shoulder and RTC tear Past Medical History  Diagnosis Date  . PONV (postoperative nausea and vomiting)     severe  . Peptic ulcer disease with hemorrhage 10/13/2008    admitted to hospital .had 4 blood transfusions  . Hypertension   . Hypothyroidism     not on meds at present. MD watching  . Neuromuscular disorder     left knee  . GERD (gastroesophageal reflux disease)     hx of  . Chronic neck pain   . History of blood transfusion 2009  . Pneumonia ~ 1989  . Diabetes mellitus without complication     "borderline, not on medication"  . H/O hiatal hernia   . Headache     hx of "when going thru menopause" (08/15/2012)  . Arthritis     "shoulders, knees, right thumb" (08/15/2012)    DISCHARGE DIAGNOSIS:   Active Problems:  * No active hospital problems. *    PROCEDURE: Procedure(s): Left shoulder REVERSE SHOULDER ARTHROPLASTY on 08/15/2012  CONSULTS:   none  HISTORY:  See H&P in chart.  HOSPITAL COURSE:  [name] is a 78 y.o. admitted on 08/15/2012 with a chief complaint of left shoulder pain, and found to have a diagnosis of Osteoarthritis and a RTC tear arthropathy left shoulder .  They were brought to the operating room on 08/15/2012 and underwent Procedure(s): REVERSE SHOULDER ARTHROPLASTY.    They were given perioperative antibiotics: Anti-infectives     Start     Dose/Rate Route Frequency Ordered Stop   08/15/12 1600   ceFAZolin (ANCEF) IVPB 1 g/50 mL premix        1 g 100 mL/hr over 30 Minutes Intravenous Every 6 hours 08/15/12 1451 08/16/12 0525   08/14/12 1450   ceFAZolin (ANCEF) 3 g in dextrose 5 % 50 mL IVPB  Status:  Discontinued        3 g 160 mL/hr over 30 Minutes Intravenous 60 min pre-op 08/14/12 1450 08/15/12 0845   08/14/12 1449    ceFAZolin (ANCEF) IVPB 2 g/50 mL premix  Status:  Discontinued        2 g 100 mL/hr over 30 Minutes Intravenous 60 min pre-op 08/14/12 1449 08/15/12 1438        .  Patient underwent the above named procedure and tolerated it well. The following day they were hemodynamically stable and pain was controlled on oral analgesics. They were neurovascularly intact to the operative extremity. OT was ordered and worked with patient per protocol. They were medically and orthopaedically stable for discharge on POD 1.     DIAGNOSTIC STUDIES:    RECENT RADIOGRAPHIC STUDIES :  Dg Shoulder Left Port  08/15/2012  *RADIOLOGY REPORT*  Clinical Data: Left shoulder replacement.  PORTABLE LEFT SHOULDER - 2+ VIEW  Comparison: None.  Findings: The patient is status post reverse left shoulder arthroplasty.  The device appears located.  No fracture is identified.  There is some left basilar atelectasis.  Calcified mediastinal lymph nodes are noted.  IMPRESSION: Reverse left shoulder arthroplasty without evidence of complication.   Original Report Authenticated By: Bernadene Bell. D'ALESSIO, M.D.     RECENT VITAL SIGNS:  Patient Vitals for the past 24 hrs:  BP Temp Temp src Pulse  Resp SpO2 Height Weight  08/16/12 0600 134/57 mmHg 98.2 F (36.8 C) - 84  16  95 % - -  08/16/12 0200 96/68 mmHg 97.6 F (36.4 C) - 86  18  93 % - -  08/15/12 2200 128/54 mmHg 97.6 F (36.4 C) - 96  18  95 % - -  08/15/12 1450 145/77 mmHg 98.6 F (37 C) Oral 85  18  96 % 5\' 4"  (1.626 m) 72.122 kg (159 lb)  08/15/12 1430 168/80 mmHg - - 82  20  97 % - -  08/15/12 1415 168/80 mmHg 98.6 F (37 C) - 81  21  98 % - -  08/15/12 1400 125/92 mmHg - - 82  22  97 % - -  08/15/12 1345 157/89 mmHg - - 82  28  97 % - -  08/15/12 1330 156/78 mmHg 98.7 F (37.1 C) - 83  18  97 % - -  08/15/12 1023 - - - 87  14  94 % - -  08/15/12 0955 - - - 85  18  97 % - -  08/15/12 0856 175/91 mmHg 98.1 F (36.7 C) Oral 77  16  95 % - -  .  RECENT EKG  RESULTS:    Orders placed during the hospital encounter of 07/03/12  . EKG 12-LEAD  . EKG 12-LEAD    DISCHARGE INSTRUCTIONS:    DISCHARGE MEDICATIONS:     Medication List     As of 08/16/2012  8:32 AM    TAKE these medications         alendronate 70 MG tablet   Commonly known as: FOSAMAX   Take 70 mg by mouth every 7 (seven) days. Wednesdays.  Take with a full glass of water on an empty stomach.      CALCIUM + D PO   Take 1 tablet by mouth daily.      cholecalciferol 1000 UNITS tablet   Commonly known as: VITAMIN D   Take 1,000 Units by mouth daily.      diazepam 5 MG tablet   Commonly known as: VALIUM   Take 1 tablet (5 mg total) by mouth every 6 (six) hours as needed for sleep (or muscle spasm).      FOLIC ACID PO   Take by mouth.      hydrochlorothiazide 25 MG tablet   Commonly known as: HYDRODIURIL   Take 25 mg by mouth daily.      HYDROmorphone 2 MG tablet   Commonly known as: DILAUDID   Take 1 tablet (2 mg total) by mouth every 3 (three) hours as needed for pain (moderate to severe pain).      LYSINE PO   Take 1 tablet by mouth every other day.      meloxicam 15 MG tablet   Commonly known as: MOBIC   Take 15 mg by mouth daily.      metoprolol tartrate 25 MG tablet   Commonly known as: LOPRESSOR   Take 25 mg by mouth 2 (two) times daily.      multivitamin with minerals tablet   Take 1 tablet by mouth daily.      NIACIN PO   Take 1 tablet by mouth daily.      traMADol 50 MG tablet   Commonly known as: ULTRAM   Take 1 tablet (50 mg total) by mouth every 6 (six) hours as needed for pain (mild).      VITAMIN B12 PO  Take 1 tablet by mouth daily.      vitamin C 500 MG tablet   Commonly known as: ASCORBIC ACID   Take 500 mg by mouth daily.        FOLLOW UP VISIT:       Follow-up Information    Follow up with SUPPLE,KEVIN M, MD. (call to be seen in 10-14 days)    Contact information:   [facility] [address],  [address] [place] [address] [phone]           DISPOSITION: Home with FU as outpatient DISCHARGE CONDITION:  {Good  Rj Pedrosa for Dr. Francena Hanly 08/16/2012, 8:32 AM

## 2012-08-19 ENCOUNTER — Encounter (HOSPITAL_COMMUNITY): Payer: Self-pay | Admitting: Orthopedic Surgery

## 2012-08-26 ENCOUNTER — Other Ambulatory Visit: Payer: Self-pay | Admitting: Orthopedic Surgery

## 2012-08-26 DIAGNOSIS — M542 Cervicalgia: Secondary | ICD-10-CM

## 2012-08-27 ENCOUNTER — Ambulatory Visit
Admission: RE | Admit: 2012-08-27 | Discharge: 2012-08-27 | Disposition: A | Discharge status: Home / Self Care | Payer: Medicare Other | Source: Ambulatory Visit | Attending: Orthopedic Surgery | Admitting: Orthopedic Surgery

## 2012-08-27 ENCOUNTER — Other Ambulatory Visit: Payer: Medicare Other

## 2012-08-27 ENCOUNTER — Other Ambulatory Visit: Payer: Self-pay | Admitting: Orthopedic Surgery

## 2012-08-27 DIAGNOSIS — M542 Cervicalgia: Secondary | ICD-10-CM

## 2012-08-27 MED ORDER — TRIAMCINOLONE ACETONIDE 40 MG/ML IJ SUSP (RADIOLOGY)
60.0000 mg | Freq: Once | INTRAMUSCULAR | Status: AC
  Administered 2012-08-27: 60 mg via EPIDURAL

## 2012-08-27 MED ORDER — IOHEXOL 300 MG/ML  SOLN
1.0000 mL | Freq: Once | INTRAMUSCULAR | Status: AC | PRN
  Administered 2012-08-27: 1 mL via INTRATHECAL

## 2012-09-04 ENCOUNTER — Encounter: Payer: Self-pay | Admitting: Orthopedic Surgery

## 2012-09-22 ENCOUNTER — Encounter: Payer: Self-pay | Admitting: Orthopedic Surgery

## 2012-10-23 ENCOUNTER — Encounter: Payer: Self-pay | Admitting: Orthopedic Surgery

## 2012-11-05 ENCOUNTER — Ambulatory Visit: Payer: Self-pay | Admitting: Family Medicine

## 2013-10-20 ENCOUNTER — Other Ambulatory Visit: Payer: Self-pay | Admitting: Orthopedic Surgery

## 2013-10-24 ENCOUNTER — Encounter (HOSPITAL_COMMUNITY): Payer: Self-pay | Admitting: Pharmacy Technician

## 2013-10-27 ENCOUNTER — Ambulatory Visit (HOSPITAL_COMMUNITY)
Admission: RE | Admit: 2013-10-27 | Discharge: 2013-10-27 | Disposition: A | Discharge status: Home / Self Care | Payer: Medicare Other | Source: Ambulatory Visit | Attending: Orthopedic Surgery | Admitting: Orthopedic Surgery

## 2013-10-27 ENCOUNTER — Encounter (HOSPITAL_COMMUNITY)
Admission: RE | Admit: 2013-10-27 | Discharge: 2013-10-27 | Disposition: A | Discharge status: Home / Self Care | Payer: Medicare Other | Source: Ambulatory Visit | Attending: Orthopedic Surgery | Admitting: Orthopedic Surgery

## 2013-10-27 ENCOUNTER — Encounter (HOSPITAL_COMMUNITY): Payer: Self-pay

## 2013-10-27 ENCOUNTER — Other Ambulatory Visit (HOSPITAL_COMMUNITY): Payer: Self-pay | Admitting: Orthopedic Surgery

## 2013-10-27 DIAGNOSIS — I889 Nonspecific lymphadenitis, unspecified: Secondary | ICD-10-CM | POA: Insufficient documentation

## 2013-10-27 DIAGNOSIS — Z01818 Encounter for other preprocedural examination: Secondary | ICD-10-CM | POA: Insufficient documentation

## 2013-10-27 DIAGNOSIS — K449 Diaphragmatic hernia without obstruction or gangrene: Secondary | ICD-10-CM | POA: Insufficient documentation

## 2013-10-27 DIAGNOSIS — J8410 Pulmonary fibrosis, unspecified: Secondary | ICD-10-CM | POA: Insufficient documentation

## 2013-10-27 DIAGNOSIS — M1710 Unilateral primary osteoarthritis, unspecified knee: Secondary | ICD-10-CM | POA: Insufficient documentation

## 2013-10-27 DIAGNOSIS — Z01812 Encounter for preprocedural laboratory examination: Secondary | ICD-10-CM | POA: Insufficient documentation

## 2013-10-27 LAB — CBC
HCT: 45.8 % (ref 36.0–46.0)
Hemoglobin: 15.2 g/dL — ABNORMAL HIGH (ref 12.0–15.0)
MCH: 29.9 pg (ref 26.0–34.0)
MCHC: 33.2 g/dL (ref 30.0–36.0)
MCV: 90.2 fL (ref 78.0–100.0)
PLATELETS: 303 10*3/uL (ref 150–400)
RBC: 5.08 MIL/uL (ref 3.87–5.11)
RDW: 13.6 % (ref 11.5–15.5)
WBC: 7.6 10*3/uL (ref 4.0–10.5)

## 2013-10-27 LAB — COMPREHENSIVE METABOLIC PANEL
ALBUMIN: 3.7 g/dL (ref 3.5–5.2)
ALK PHOS: 100 U/L (ref 39–117)
ALT: 17 U/L (ref 0–35)
AST: 20 U/L (ref 0–37)
BILIRUBIN TOTAL: 0.4 mg/dL (ref 0.3–1.2)
BUN: 20 mg/dL (ref 6–23)
CHLORIDE: 97 meq/L (ref 96–112)
CO2: 29 meq/L (ref 19–32)
CREATININE: 0.97 mg/dL (ref 0.50–1.10)
Calcium: 9.5 mg/dL (ref 8.4–10.5)
GFR calc Af Amer: 64 mL/min — ABNORMAL LOW (ref >90)
GFR, EST NON AFRICAN AMERICAN: 55 mL/min — AB (ref >90)
Glucose, Bld: 121 mg/dL — ABNORMAL HIGH (ref 70–99)
POTASSIUM: 4.6 meq/L (ref 3.7–5.3)
SODIUM: 137 meq/L (ref 137–147)
Total Protein: 7 g/dL (ref 6.0–8.3)

## 2013-10-27 LAB — ABO/RH: ABO/RH(D): A POS

## 2013-10-27 LAB — PROTIME-INR
INR: 1.01 (ref 0.00–1.49)
Prothrombin Time: 13.1 seconds (ref 11.6–15.2)

## 2013-10-27 LAB — SURGICAL PCR SCREEN
MRSA, PCR: NEGATIVE
STAPHYLOCOCCUS AUREUS: POSITIVE — AB

## 2013-10-27 LAB — APTT: aPTT: 34 seconds (ref 24–37)

## 2013-10-27 NOTE — Progress Notes (Signed)
Medical clearanec note and ekg dr Elease Hashimotomaloney 09-25-2013 on chart

## 2013-10-27 NOTE — Progress Notes (Signed)
Spoke with pt by phone, pt aware urine specimen needed day of surgery 10-31-13.

## 2013-10-27 NOTE — Patient Instructions (Addendum)
[name]  10/27/2013   Your procedure is scheduled on: Friday January 9th  [facility]45 [facility]  Call this number if you have problems the morning of surgery [phone]   Remember:   Do not eat food  :After Midnight.   clear liquids midnight until 915 am day of surgery, then nothing by mouth.   Take these medicines the morning of surgery with A SIP OF WATER: metoprolol tartrate                                SEE [facility] PREPARING FOR SURGERY SHEET             You may not have any metal on your body including hair pins and piercings  Do not wear jewelry, make-up.  Do not wear lotions, powders, or perfumes. You may wear deodorant.   Men may shave face and neck.  Do not bring valuables to the hospital. [facility] IS NOT RESPONSIBLE FOR VALUEABLES.  Contacts, dentures or bridgework may not be worn into surgery.  Leave suitcase in the car. After surgery it may be brought to your room.  For patients admitted to the hospital, checkout time is 11:00 AM the day of discharge.   Patients discharged the day of surgery will not be allowed to drive home.  Name and phone number of your driver:  Special Instructions: N/A   Please read over the following fact sheets that you were given: mrsa information, blood sheet, incentive spirometer sheet, clear liquid sheet, [name] preparing for surgery sheet  Call Cain SieveSharon Akiera Allbaugh RN pre op nurse if needed [phone]- [phone]    FAILURE TO FOLLOW THESE INSTRUCTIONS MAY RESULT IN THE CANCELLATION OF YOUR SURGERY.  PATIENT SIGNATURE___________________________________________  NURSE SIGNATURE_____________________________________________

## 2013-10-28 ENCOUNTER — Other Ambulatory Visit: Payer: Self-pay | Admitting: Surgical

## 2013-10-28 NOTE — H&P (Signed)
TOTAL KNEE ADMISSION H&P  Patient is being admitted for right total knee arthroplasty.  Subjective:  Chief Complaint:right knee pain.  HPI: [name], 80 y.o. female, has a history of pain and functional disability in the right knee due to arthritis and has failed non-surgical conservative treatments for greater than 12 weeks to includeNSAID's and/or analgesics, corticosteriod injections, viscosupplementation injections and activity modification.  Onset of symptoms was gradual, starting 2 years ago with gradually worsening course since that time. The patient noted no past surgery on the right knee(s).  Patient currently rates pain in the right knee(s) at 6 out of 10 with activity. Patient has night pain, worsening of pain with activity and weight bearing, pain that interferes with activities of daily living, pain with passive range of motion, crepitus and joint swelling.  Patient has evidence of periarticular osteophytes and joint space narrowing by imaging studies. There is no active infection.    Past Medical History  Diagnosis Date  . PONV (postoperative nausea and vomiting)     severe  . Peptic ulcer disease with hemorrhage 10/13/2008    admitted to hospital .had 4 blood transfusions  . Hypertension   . Hypothyroidism     not on meds at present. MD watching  . Neuromuscular disorder     left knee  . GERD (gastroesophageal reflux disease)     hx of  . Chronic neck pain   . History of blood transfusion 2009  . Diabetes mellitus without complication     "borderline, not on medication"  . H/O hiatal hernia   . Headache(784.0)     hx of "when going thru menopause" (08/15/2012)  . Arthritis     "shoulders, knees, right thumb" (08/15/2012)  . Blood clot in vein 15 yrs ago    pt not sure which leg  . Pneumonia ~ 1989    Past Surgical History  Procedure Laterality Date  . Reverse total shoulder arthroplasty  08/15/2012    left  . Tonsillectomy and adenoidectomy  ~ 1945  .  Nasal polyp excision  ~ 1946  . Shoulder surgery  ~ 2003    "bone spur removed; left side"  . Reverse shoulder arthroplasty  08/15/2012    Procedure: REVERSE SHOULDER ARTHROPLASTY;  Surgeon: Senaida Lange, MD;  Location: [facility] OR;  Service: Orthopedics;  Laterality: Left;     Current outpatient prescriptions: alendronate (FOSAMAX) 70 MG tablet, Take 70 mg by mouth every 7 (seven) days. Wednesdays.  Take with a full glass of water on an empty stomach., Disp: , Rfl: ;   hydrochlorothiazide (HYDRODIURIL) 25 MG tablet, Take 25 mg by mouth daily with breakfast. , Disp: , Rfl: ;   metoprolol tartrate (LOPRESSOR) 25 MG tablet, Take 25 mg by mouth 2 (two) times daily., Disp: , Rfl:  Calcium Carbonate-Vitamin D (CALCIUM + D PO), Take 1 tablet by mouth daily., Disp: , Rfl: ;   cholecalciferol (VITAMIN D) 1000 UNITS tablet, Take 1,000 Units by mouth daily., Disp: , Rfl: ;   Cyanocobalamin (VITAMIN B12 PO), Take 1 tablet by mouth daily., Disp: , Rfl: ;   Multiple Vitamins-Minerals (MULTIVITAMIN WITH MINERALS) tablet, Take 1 tablet by mouth daily., Disp: , Rfl: ;   NIACIN PO, Take 1 tablet by mouth daily., Disp: , Rfl:  vitamin C (ASCORBIC ACID) 500 MG tablet, Take 500 mg by mouth daily., Disp: , Rfl:   Allergies  Allergen Reactions  . Morphine And Related Other (See Comments)    Makes me crazy  .  Scallops [Shellfish Allergy] Nausea And Vomiting    Able to eat shrimp and other shellfish  . Vicodin [Hydrocodone-Acetaminophen] Nausea Only    History  Substance Use Topics  . Smoking status: Never Smoker   . Smokeless tobacco: Never Used  . Alcohol Use: Yes     Comment: wine rarely    Family History Father- alcholism  Review of Systems  Constitutional: Negative.   HENT: Negative.   Eyes: Negative.   Respiratory: Negative.   Cardiovascular: Negative.   Gastrointestinal: Negative.   Genitourinary: Positive for frequency. Negative for dysuria, urgency, hematuria and flank pain.   Musculoskeletal: Positive for back pain, joint pain and myalgias. Negative for falls and neck pain.       Bilateral knee pain  Skin: Negative.   Neurological: Negative.   Endo/Heme/Allergies: Negative.   Psychiatric/Behavioral: Negative.     Objective:  Physical Exam  Constitutional: She is oriented to person, place, and time. She appears well-developed and well-nourished. No distress.  HENT:  Head: Normocephalic and atraumatic.  Right Ear: External ear normal.  Left Ear: External ear normal.  Nose: Nose normal.  Mouth/Throat: Oropharynx is clear and moist.  Eyes: Conjunctivae and EOM are normal.  Neck: Normal range of motion. Neck supple.  Cardiovascular: Normal rate, regular rhythm, normal heart sounds and intact distal pulses.   No murmur heard. Respiratory: Effort normal and breath sounds normal.  GI: Soft. Bowel sounds are normal.  Musculoskeletal:       Right hip: Normal.       Left hip: Normal.       Right knee: She exhibits decreased range of motion and swelling. She exhibits no effusion and no erythema. Tenderness found. Medial joint line and lateral joint line tenderness noted.       Left knee: She exhibits decreased range of motion and swelling. She exhibits no effusion and no erythema. Tenderness found. Medial joint line and lateral joint line tenderness noted.       Right lower leg: She exhibits no tenderness and no swelling.       Left lower leg: She exhibits no tenderness and no swelling.  Both knees showed no effusion. Right knee with slight varus. Range is 5 to 125, marked crepitus on range of motion. Tenderness medial greater than lateral. NO instability. Left knee, no effusion. Range is 5 to 125, moderate crepitus on range of motion, slight tenderness medial greater than lateral with no instability  Neurological: She is alert and oriented to person, place, and time. She has normal strength and normal reflexes. No sensory deficit.  Skin: No rash noted. She is  not diaphoretic. No erythema.  Psychiatric: She has a normal mood and affect. Her behavior is normal.    Vitals Weight: 160 lb Height: 64 in Body Surface Area: 1.81 m Body Mass Index: 27.46 kg/m Pulse: 72 (Regular) BP: 128/84 (Sitting, Left Arm, Standard)  Imaging Review Plain radiographs demonstrate severe degenerative joint disease of the right knee(s). The overall alignment ismild valgus. The bone quality appears to be adequate for age and reported activity level.  Assessment/Plan:  End stage arthritis, right knee   The patient history, physical examination, clinical judgment of the provider and imaging studies are consistent with end stage degenerative joint disease of the right knee(s) and total knee arthroplasty is deemed medically necessary. The treatment options including medical management, injection therapy arthroscopy and arthroplasty were discussed at length. The risks and benefits of total knee arthroplasty were presented and reviewed. The risks due  to aseptic loosening, infection, stiffness, patella tracking problems, thromboembolic complications and other imponderables were discussed. The patient acknowledged the explanation, agreed to proceed with the plan and consent was signed. Patient is being admitted for inpatient treatment for surgery, pain control, PT, OT, prophylactic antibiotics, VTE prophylaxis, progressive ambulation and ADL's and discharge planning. The patient is planning to be discharged home with home health services     MartinAmber Rhone [place], [place] JerseyPA-C

## 2013-10-29 NOTE — Progress Notes (Signed)
muporocin ointment called in to [facility] [address] from dr ConocoPhillipsaluisio, fax placed on chart

## 2013-10-31 ENCOUNTER — Encounter (HOSPITAL_COMMUNITY): Payer: Self-pay | Admitting: *Deleted

## 2013-10-31 ENCOUNTER — Inpatient Hospital Stay (HOSPITAL_COMMUNITY)
Admission: RE | Admit: 2013-10-31 | Discharge: 2013-11-03 | DRG: 470 | Disposition: A | Discharge status: Home Health | Payer: Medicare Other | Source: Ambulatory Visit | Attending: Orthopedic Surgery | Admitting: Orthopedic Surgery

## 2013-10-31 ENCOUNTER — Encounter (HOSPITAL_COMMUNITY)
Admission: RE | Disposition: A | Discharge status: Home Health | Payer: Self-pay | Source: Ambulatory Visit | Attending: Orthopedic Surgery

## 2013-10-31 ENCOUNTER — Encounter (HOSPITAL_COMMUNITY): Payer: Medicare Other | Admitting: Anesthesiology

## 2013-10-31 ENCOUNTER — Inpatient Hospital Stay (HOSPITAL_COMMUNITY): Payer: Medicare Other | Admitting: Anesthesiology

## 2013-10-31 DIAGNOSIS — M542 Cervicalgia: Secondary | ICD-10-CM | POA: Diagnosis present

## 2013-10-31 DIAGNOSIS — K219 Gastro-esophageal reflux disease without esophagitis: Secondary | ICD-10-CM | POA: Diagnosis present

## 2013-10-31 DIAGNOSIS — M179 Osteoarthritis of knee, unspecified: Secondary | ICD-10-CM

## 2013-10-31 DIAGNOSIS — I10 Essential (primary) hypertension: Secondary | ICD-10-CM | POA: Diagnosis present

## 2013-10-31 DIAGNOSIS — D62 Acute posthemorrhagic anemia: Secondary | ICD-10-CM

## 2013-10-31 DIAGNOSIS — E119 Type 2 diabetes mellitus without complications: Secondary | ICD-10-CM | POA: Diagnosis present

## 2013-10-31 DIAGNOSIS — Z6827 Body mass index (BMI) 27.0-27.9, adult: Secondary | ICD-10-CM

## 2013-10-31 DIAGNOSIS — Z96651 Presence of right artificial knee joint: Secondary | ICD-10-CM

## 2013-10-31 DIAGNOSIS — M1710 Unilateral primary osteoarthritis, unspecified knee: Principal | ICD-10-CM | POA: Diagnosis present

## 2013-10-31 DIAGNOSIS — E871 Hypo-osmolality and hyponatremia: Secondary | ICD-10-CM

## 2013-10-31 DIAGNOSIS — K449 Diaphragmatic hernia without obstruction or gangrene: Secondary | ICD-10-CM | POA: Diagnosis present

## 2013-10-31 DIAGNOSIS — E039 Hypothyroidism, unspecified: Secondary | ICD-10-CM | POA: Diagnosis present

## 2013-10-31 HISTORY — PX: TOTAL KNEE ARTHROPLASTY: SHX125

## 2013-10-31 HISTORY — PX: INJECTION KNEE: SHX2446

## 2013-10-31 LAB — URINALYSIS, ROUTINE W REFLEX MICROSCOPIC
Bilirubin Urine: NEGATIVE
Glucose, UA: NEGATIVE mg/dL
Hgb urine dipstick: NEGATIVE
Ketones, ur: NEGATIVE mg/dL
LEUKOCYTES UA: NEGATIVE
Nitrite: POSITIVE — AB
PROTEIN: NEGATIVE mg/dL
Specific Gravity, Urine: 1.01 (ref 1.005–1.030)
UROBILINOGEN UA: 0.2 mg/dL (ref 0.0–1.0)
pH: 6.5 (ref 5.0–8.0)

## 2013-10-31 LAB — TYPE AND SCREEN
ABO/RH(D): A POS
Antibody Screen: NEGATIVE

## 2013-10-31 LAB — URINE MICROSCOPIC-ADD ON

## 2013-10-31 LAB — GLUCOSE, CAPILLARY: Glucose-Capillary: 91 mg/dL (ref 70–99)

## 2013-10-31 SURGERY — ARTHROPLASTY, KNEE, TOTAL
Anesthesia: Spinal | Site: Knee | Laterality: Right

## 2013-10-31 MED ORDER — FENTANYL CITRATE 0.05 MG/ML IJ SOLN
INTRAMUSCULAR | Status: DC | PRN
  Administered 2013-10-31 (×2): 50 ug via INTRAVENOUS

## 2013-10-31 MED ORDER — CEFAZOLIN SODIUM-DEXTROSE 2-3 GM-% IV SOLR
INTRAVENOUS | Status: AC
  Filled 2013-10-31: qty 50

## 2013-10-31 MED ORDER — ONDANSETRON HCL 4 MG/2ML IJ SOLN
INTRAMUSCULAR | Status: DC | PRN
  Administered 2013-10-31: 4 mg via INTRAVENOUS

## 2013-10-31 MED ORDER — ONDANSETRON HCL 4 MG/2ML IJ SOLN
INTRAMUSCULAR | Status: AC
  Filled 2013-10-31: qty 2

## 2013-10-31 MED ORDER — DEXAMETHASONE SODIUM PHOSPHATE 10 MG/ML IJ SOLN
10.0000 mg | Freq: Every day | INTRAMUSCULAR | Status: AC
  Filled 2013-10-31: qty 1

## 2013-10-31 MED ORDER — METHYLPREDNISOLONE SODIUM SUCC 125 MG IJ SOLR
INTRAMUSCULAR | Status: AC
  Filled 2013-10-31: qty 2

## 2013-10-31 MED ORDER — HYDROCHLOROTHIAZIDE 25 MG PO TABS
25.0000 mg | ORAL_TABLET | Freq: Every day | ORAL | Status: DC
  Administered 2013-11-01 – 2013-11-03 (×3): 25 mg via ORAL
  Filled 2013-10-31 (×4): qty 1

## 2013-10-31 MED ORDER — LACTATED RINGERS IV SOLN
INTRAVENOUS | Status: DC
  Administered 2013-10-31: 16:00:00 via INTRAVENOUS
  Administered 2013-10-31: 1000 mL via INTRAVENOUS

## 2013-10-31 MED ORDER — METHOCARBAMOL 500 MG PO TABS
500.0000 mg | ORAL_TABLET | Freq: Four times a day (QID) | ORAL | Status: DC | PRN
Start: 2013-10-31 — End: 2013-11-03
  Administered 2013-11-01 – 2013-11-03 (×8): 500 mg via ORAL
  Filled 2013-10-31 (×8): qty 1

## 2013-10-31 MED ORDER — RIVAROXABAN 10 MG PO TABS
10.0000 mg | ORAL_TABLET | Freq: Every day | ORAL | Status: DC
  Administered 2013-11-01 – 2013-11-03 (×3): 10 mg via ORAL
  Filled 2013-10-31 (×4): qty 1

## 2013-10-31 MED ORDER — DIPHENHYDRAMINE HCL 12.5 MG/5ML PO ELIX
12.5000 mg | ORAL_SOLUTION | ORAL | Status: DC | PRN
  Administered 2013-11-01 – 2013-11-02 (×2): 25 mg via ORAL
  Filled 2013-10-31 (×2): qty 10

## 2013-10-31 MED ORDER — CEFAZOLIN SODIUM 1-5 GM-% IV SOLN
1.0000 g | Freq: Four times a day (QID) | INTRAVENOUS | Status: AC
  Administered 2013-10-31 – 2013-11-01 (×2): 1 g via INTRAVENOUS
  Filled 2013-10-31 (×2): qty 50

## 2013-10-31 MED ORDER — MIDAZOLAM HCL 2 MG/2ML IJ SOLN
INTRAMUSCULAR | Status: AC
  Filled 2013-10-31: qty 2

## 2013-10-31 MED ORDER — PROPOFOL 10 MG/ML IV BOLUS
INTRAVENOUS | Status: AC
  Filled 2013-10-31: qty 20

## 2013-10-31 MED ORDER — FLEET ENEMA 7-19 GM/118ML RE ENEM
1.0000 | ENEMA | Freq: Once | RECTAL | Status: AC | PRN
Start: 2013-10-31 — End: 2013-10-31

## 2013-10-31 MED ORDER — FENTANYL CITRATE 0.05 MG/ML IJ SOLN
INTRAMUSCULAR | Status: AC
  Filled 2013-10-31: qty 2

## 2013-10-31 MED ORDER — BUPIVACAINE HCL (PF) 0.25 % IJ SOLN
INTRAMUSCULAR | Status: AC
  Filled 2013-10-31: qty 30

## 2013-10-31 MED ORDER — ACETAMINOPHEN 500 MG PO TABS
1000.0000 mg | ORAL_TABLET | Freq: Four times a day (QID) | ORAL | Status: AC
  Administered 2013-10-31 – 2013-11-01 (×4): 1000 mg via ORAL
  Filled 2013-10-31 (×6): qty 2

## 2013-10-31 MED ORDER — SODIUM CHLORIDE 0.9 % IJ SOLN
INTRAMUSCULAR | Status: AC
  Filled 2013-10-31: qty 50

## 2013-10-31 MED ORDER — DEXAMETHASONE SODIUM PHOSPHATE 10 MG/ML IJ SOLN
INTRAMUSCULAR | Status: AC
  Filled 2013-10-31: qty 1

## 2013-10-31 MED ORDER — METHYLPREDNISOLONE ACETATE 40 MG/ML IJ SUSP
INTRAMUSCULAR | Status: AC
  Filled 2013-10-31: qty 1

## 2013-10-31 MED ORDER — ACETAMINOPHEN 650 MG RE SUPP: 650.0000 mg | Freq: Four times a day (QID) | RECTAL | Status: DC | PRN

## 2013-10-31 MED ORDER — METHYLPREDNISOLONE ACETATE 40 MG/ML IJ SUSP
INTRAMUSCULAR | Status: DC | PRN
  Administered 2013-10-31: 40 mg via INTRA_ARTICULAR

## 2013-10-31 MED ORDER — HYDROMORPHONE HCL PF 1 MG/ML IJ SOLN
0.5000 mg | INTRAMUSCULAR | Status: DC | PRN
  Administered 2013-10-31: 21:00:00 0.5 mg via INTRAVENOUS
  Filled 2013-10-31: qty 1

## 2013-10-31 MED ORDER — CEFAZOLIN SODIUM-DEXTROSE 2-3 GM-% IV SOLR
2.0000 g | INTRAVENOUS | Status: AC
  Administered 2013-10-31: 2 g via INTRAVENOUS

## 2013-10-31 MED ORDER — POTASSIUM CHLORIDE IN NACL 20-0.9 MEQ/L-% IV SOLN
INTRAVENOUS | Status: DC
  Administered 2013-10-31: 20:00:00 via INTRAVENOUS
  Filled 2013-10-31 (×3): qty 1000

## 2013-10-31 MED ORDER — PROPOFOL INFUSION 10 MG/ML OPTIME
INTRAVENOUS | Status: DC | PRN
  Administered 2013-10-31: 100 ug/kg/min via INTRAVENOUS

## 2013-10-31 MED ORDER — DEXAMETHASONE SODIUM PHOSPHATE 10 MG/ML IJ SOLN
10.0000 mg | Freq: Once | INTRAMUSCULAR | Status: AC
  Administered 2013-10-31: 10 mg via INTRAVENOUS

## 2013-10-31 MED ORDER — METHOCARBAMOL 100 MG/ML IJ SOLN
500.0000 mg | Freq: Four times a day (QID) | INTRAMUSCULAR | Status: DC | PRN
  Administered 2013-10-31: 500 mg via INTRAVENOUS
  Filled 2013-10-31: qty 5

## 2013-10-31 MED ORDER — 0.9 % SODIUM CHLORIDE (POUR BTL) OPTIME
TOPICAL | Status: DC | PRN
  Administered 2013-10-31: 1000 mL

## 2013-10-31 MED ORDER — BISACODYL 10 MG RE SUPP: 10.0000 mg | Freq: Every day | RECTAL | Status: DC | PRN

## 2013-10-31 MED ORDER — TRAMADOL HCL 50 MG PO TABS
50.0000 mg | ORAL_TABLET | Freq: Four times a day (QID) | ORAL | Status: DC | PRN
  Administered 2013-11-01: 09:00:00 100 mg via ORAL
  Administered 2013-11-01: 01:00:00 50 mg via ORAL
  Administered 2013-11-02: 100 mg via ORAL
  Administered 2013-11-02: 04:00:00 50 mg via ORAL
  Administered 2013-11-03: 100 mg via ORAL
  Filled 2013-10-31: qty 2
  Filled 2013-10-31: qty 1
  Filled 2013-10-31 (×2): qty 2
  Filled 2013-10-31: qty 1

## 2013-10-31 MED ORDER — METOCLOPRAMIDE HCL 10 MG PO TABS
5.0000 mg | ORAL_TABLET | Freq: Three times a day (TID) | ORAL | Status: DC | PRN
  Administered 2013-11-01: 10 mg via ORAL
  Filled 2013-10-31: qty 1

## 2013-10-31 MED ORDER — DOCUSATE SODIUM 100 MG PO CAPS
100.0000 mg | ORAL_CAPSULE | Freq: Two times a day (BID) | ORAL | Status: DC
  Administered 2013-10-31 – 2013-11-03 (×6): 100 mg via ORAL

## 2013-10-31 MED ORDER — MIDAZOLAM HCL 5 MG/5ML IJ SOLN
INTRAMUSCULAR | Status: DC | PRN
  Administered 2013-10-31 (×2): 1 mg via INTRAVENOUS

## 2013-10-31 MED ORDER — PHENOL 1.4 % MT LIQD
1.0000 | OROMUCOSAL | Status: DC | PRN
  Filled 2013-10-31: qty 177

## 2013-10-31 MED ORDER — ACETAMINOPHEN 325 MG PO TABS: 650.0000 mg | ORAL_TABLET | Freq: Four times a day (QID) | ORAL | Status: DC | PRN

## 2013-10-31 MED ORDER — BUPIVACAINE LIPOSOME 1.3 % IJ SUSP
20.0000 mL | Freq: Once | INTRAMUSCULAR | Status: DC
  Filled 2013-10-31: qty 20

## 2013-10-31 MED ORDER — EPHEDRINE SULFATE 50 MG/ML IJ SOLN
INTRAMUSCULAR | Status: DC | PRN
  Administered 2013-10-31: 10 mg via INTRAVENOUS
  Administered 2013-10-31: 5 mg via INTRAVENOUS
  Administered 2013-10-31: 10 mg via INTRAVENOUS

## 2013-10-31 MED ORDER — ONDANSETRON HCL 4 MG PO TABS: 4.0000 mg | ORAL_TABLET | Freq: Four times a day (QID) | ORAL | Status: DC | PRN

## 2013-10-31 MED ORDER — ONDANSETRON HCL 4 MG/2ML IJ SOLN: 4.0000 mg | Freq: Four times a day (QID) | INTRAMUSCULAR | Status: DC | PRN

## 2013-10-31 MED ORDER — ACETAMINOPHEN 500 MG PO TABS
1000.0000 mg | ORAL_TABLET | Freq: Once | ORAL | Status: AC
  Administered 2013-10-31: 1000 mg via ORAL
  Filled 2013-10-31: qty 2

## 2013-10-31 MED ORDER — TRANEXAMIC ACID 100 MG/ML IV SOLN
1000.0000 mg | INTRAVENOUS | Status: AC
  Administered 2013-10-31: 1000 mg via INTRAVENOUS
  Filled 2013-10-31: qty 10

## 2013-10-31 MED ORDER — BUPIVACAINE IN DEXTROSE 0.75-8.25 % IT SOLN
INTRATHECAL | Status: DC | PRN
  Administered 2013-10-31: 2 mL via INTRATHECAL

## 2013-10-31 MED ORDER — SODIUM CHLORIDE 0.9 % IJ SOLN
INTRAMUSCULAR | Status: DC | PRN
  Administered 2013-10-31: 30 mL via INTRAVENOUS

## 2013-10-31 MED ORDER — MENTHOL 3 MG MT LOZG
1.0000 | LOZENGE | OROMUCOSAL | Status: DC | PRN
  Filled 2013-10-31: qty 9

## 2013-10-31 MED ORDER — SODIUM CHLORIDE 0.9 % IR SOLN
Status: DC | PRN
  Administered 2013-10-31: 1000 mL

## 2013-10-31 MED ORDER — DEXAMETHASONE 6 MG PO TABS
10.0000 mg | ORAL_TABLET | Freq: Every day | ORAL | Status: AC
  Administered 2013-11-01: 10:00:00 10 mg via ORAL
  Filled 2013-10-31: qty 1

## 2013-10-31 MED ORDER — BUPIVACAINE LIPOSOME 1.3 % IJ SUSP
INTRAMUSCULAR | Status: DC | PRN
Start: 2013-10-31 — End: 2013-10-31
  Administered 2013-10-31: 20 mL

## 2013-10-31 MED ORDER — BUPIVACAINE HCL 0.25 % IJ SOLN
INTRAMUSCULAR | Status: DC | PRN
  Administered 2013-10-31: 20 mL

## 2013-10-31 MED ORDER — METOPROLOL TARTRATE 25 MG PO TABS
25.0000 mg | ORAL_TABLET | Freq: Two times a day (BID) | ORAL | Status: DC
  Administered 2013-10-31 – 2013-11-03 (×6): 25 mg via ORAL
  Filled 2013-10-31 (×7): qty 1

## 2013-10-31 MED ORDER — METOCLOPRAMIDE HCL 5 MG/ML IJ SOLN: 5.0000 mg | Freq: Three times a day (TID) | INTRAMUSCULAR | Status: DC | PRN

## 2013-10-31 MED ORDER — SODIUM CHLORIDE 0.9 % IV SOLN: INTRAVENOUS | Status: DC

## 2013-10-31 MED ORDER — HYDROMORPHONE HCL 2 MG PO TABS
2.0000 mg | ORAL_TABLET | ORAL | Status: DC | PRN
  Administered 2013-11-01 (×2): 2 mg via ORAL
  Administered 2013-11-02 (×2): 4 mg via ORAL
  Administered 2013-11-02: 03:00:00 2 mg via ORAL
  Administered 2013-11-02 – 2013-11-03 (×3): 4 mg via ORAL
  Filled 2013-10-31: qty 1
  Filled 2013-10-31: qty 2
  Filled 2013-10-31: qty 1
  Filled 2013-10-31 (×2): qty 2
  Filled 2013-10-31: qty 1
  Filled 2013-10-31 (×3): qty 2

## 2013-10-31 MED ORDER — POLYETHYLENE GLYCOL 3350 17 G PO PACK
17.0000 g | PACK | Freq: Every day | ORAL | Status: DC | PRN
Start: 2013-10-31 — End: 2013-11-03
  Administered 2013-11-01 – 2013-11-02 (×2): 17 g via ORAL

## 2013-10-31 SURGICAL SUPPLY — 58 items
BAG ZIPLOCK 12X15 (MISCELLANEOUS) ×4 IMPLANT
BANDAGE ELASTIC 6 VELCRO ST LF (GAUZE/BANDAGES/DRESSINGS) ×4 IMPLANT
BANDAGE ESMARK 6X9 LF (GAUZE/BANDAGES/DRESSINGS) ×2 IMPLANT
BLADE SAG 18X100X1.27 (BLADE) ×4 IMPLANT
BLADE SAW SGTL 11.0X1.19X90.0M (BLADE) ×4 IMPLANT
BNDG ESMARK 6X9 LF (GAUZE/BANDAGES/DRESSINGS) ×4
BOWL SMART MIX CTS (DISPOSABLE) ×4 IMPLANT
CAPT RP KNEE ×4 IMPLANT
CEMENT HV SMART SET (Cement) ×8 IMPLANT
CLOSURE WOUND 1/2 X4 (GAUZE/BANDAGES/DRESSINGS) ×1
CUFF TOURN SGL QUICK 34 (TOURNIQUET CUFF) ×2
CUFF TRNQT CYL 34X4X40X1 (TOURNIQUET CUFF) ×2 IMPLANT
DECANTER SPIKE VIAL GLASS SM (MISCELLANEOUS) ×4 IMPLANT
DRAPE EXTREMITY T 121X128X90 (DRAPE) ×4 IMPLANT
DRAPE POUCH INSTRU U-SHP 10X18 (DRAPES) ×4 IMPLANT
DRAPE U-SHAPE 47X51 STRL (DRAPES) ×4 IMPLANT
DRSG ADAPTIC 3X8 NADH LF (GAUZE/BANDAGES/DRESSINGS) ×4 IMPLANT
DURAPREP 26ML APPLICATOR (WOUND CARE) ×4 IMPLANT
ELECT REM PT RETURN 9FT ADLT (ELECTROSURGICAL) ×4
ELECTRODE REM PT RTRN 9FT ADLT (ELECTROSURGICAL) ×2 IMPLANT
EVACUATOR 1/8 PVC DRAIN (DRAIN) ×4 IMPLANT
FACESHIELD LNG OPTICON STERILE (SAFETY) ×16 IMPLANT
GLOVE BIO SURGEON STRL SZ7.5 (GLOVE) IMPLANT
GLOVE BIO SURGEON STRL SZ8 (GLOVE) ×4 IMPLANT
GLOVE BIOGEL PI IND STRL 7.5 (GLOVE) ×2 IMPLANT
GLOVE BIOGEL PI IND STRL 8 (GLOVE) ×4 IMPLANT
GLOVE BIOGEL PI INDICATOR 7.5 (GLOVE) ×2
GLOVE BIOGEL PI INDICATOR 8 (GLOVE) ×4
GLOVE SURG SS PI 6.5 STRL IVOR (GLOVE) IMPLANT
GLOVE SURG SS PI 7.5 STRL IVOR (GLOVE) ×12 IMPLANT
GOWN STRL REIN 3XL XLG LVL4 (GOWN DISPOSABLE) ×4 IMPLANT
GOWN STRL REUS W/TWL LRG LVL3 (GOWN DISPOSABLE) ×4 IMPLANT
GOWN STRL REUS W/TWL XL LVL3 (GOWN DISPOSABLE) ×8 IMPLANT
HANDPIECE INTERPULSE COAX TIP (DISPOSABLE) ×2
IMMOBILIZER KNEE 20 (SOFTGOODS) ×4 IMPLANT
KIT BASIN OR (CUSTOM PROCEDURE TRAY) ×4 IMPLANT
MANIFOLD NEPTUNE II (INSTRUMENTS) ×4 IMPLANT
NDL SAFETY ECLIPSE 18X1.5 (NEEDLE) ×4 IMPLANT
NEEDLE HYPO 18GX1.5 SHARP (NEEDLE) ×4
NS IRRIG 1000ML POUR BTL (IV SOLUTION) ×4 IMPLANT
PACK TOTAL JOINT (CUSTOM PROCEDURE TRAY) ×4 IMPLANT
PAD ABD 8X10 STRL (GAUZE/BANDAGES/DRESSINGS) ×4 IMPLANT
PADDING CAST COTTON 6X4 STRL (CAST SUPPLIES) ×8 IMPLANT
POSITIONER SURGICAL ARM (MISCELLANEOUS) ×4 IMPLANT
SET HNDPC FAN SPRY TIP SCT (DISPOSABLE) ×2 IMPLANT
SPONGE GAUZE 4X4 12PLY (GAUZE/BANDAGES/DRESSINGS) ×4 IMPLANT
STRIP CLOSURE SKIN 1/2X4 (GAUZE/BANDAGES/DRESSINGS) ×3 IMPLANT
SUCTION FRAZIER 12FR DISP (SUCTIONS) ×4 IMPLANT
SUT MNCRL AB 4-0 PS2 18 (SUTURE) ×4 IMPLANT
SUT VIC AB 2-0 CT1 27 (SUTURE) ×6
SUT VIC AB 2-0 CT1 TAPERPNT 27 (SUTURE) ×6 IMPLANT
SUT VLOC 180 0 24IN GS25 (SUTURE) ×4 IMPLANT
SYR 20CC LL (SYRINGE) ×4 IMPLANT
SYR 50ML LL SCALE MARK (SYRINGE) ×4 IMPLANT
TOWEL OR 17X26 10 PK STRL BLUE (TOWEL DISPOSABLE) ×8 IMPLANT
TRAY FOLEY CATH 14FRSI W/METER (CATHETERS) ×4 IMPLANT
WATER STERILE IRR 1500ML POUR (IV SOLUTION) ×8 IMPLANT
WRAP KNEE MAXI GEL POST OP (GAUZE/BANDAGES/DRESSINGS) ×4 IMPLANT

## 2013-10-31 NOTE — Anesthesia Procedure Notes (Signed)
Spinal  Patient location during procedure: OR Start time: 10/31/2013 3:23 PM End time: 10/31/2013 3:28 PM Staffing CRNA/Resident: Lajuana Carry E Performed by: resident/CRNA  Preanesthetic Checklist Completed: patient identified, site marked, surgical consent, pre-op evaluation, timeout performed, IV checked, risks and benefits discussed and monitors and equipment checked Spinal Block Patient position: sitting Prep: Betadine and site prepped and draped Patient monitoring: heart rate, continuous pulse ox and blood pressure Approach: midline Location: L3-4 Injection technique: single-shot Needle Needle type: Spinocan  Needle gauge: 22 G Needle length: 9 cm Assessment Sensory level: T10 Additional Notes Kit expiration checked,Time out, sitting position, sterile prep and drape. Lido local skin wheal at L3-4, clear CSF, no heme on first attempt. Pt. tol well, return to supine.

## 2013-10-31 NOTE — Op Note (Addendum)
Pre-operative diagnosis- Osteoarthritis  Bilateral knee(s)  Post-operative diagnosis- Osteoarthritis Bilateral knee(s)  Procedure-  Right  Total Knee Arthroplasty   Left knee Cortisone injection  Surgeon- [name] RankinFrank V. Arelene Moroni, MD  Assistant- Avel Peacerew Perkins, PA-C   Anesthesia-  Spinal  EBL-* No blood loss amount entered *   Drains Hemovac  Tourniquet time-  Total Tourniquet Time Documented: Thigh (Right) - 29 minutes Total: Thigh (Right) - 29 minutes    Complications- None  Condition-PACU - hemodynamically stable.   Brief Clinical Note  [name] is a 80 y.o. year old female with end stage OA of her right knee with progressively worsening pain and dysfunction. She has constant pain, with activity and at rest and significant functional deficits with difficulties even with ADLs. She has had extensive non-op management including analgesics, injections of cortisone and viscosupplements, and home exercise program, but remains in significant pain with significant dysfunction.Radiographs show bone on bone arthritis lateral and patellofemoral. She presents now for right Total Knee Arthroplasty and requests cortisone injection left knee for her symptomatic arthritis on that side.    Procedure in detail---   The patient is brought into the operating room and positioned supine on the operating table. After successful administration of  Spinal,   a tourniquet is placed high on the  Right thigh(s) and the lower extremity is prepped and draped in the usual sterile fashion. Time out is performed by the operating team and then the  Right lower extremity is wrapped in Esmarch, knee flexed and the tourniquet inflated to 300 mmHg.       A midline incision is made with a ten blade through the subcutaneous tissue to the level of the extensor mechanism. A fresh blade is used to make a medial parapatellar arthrotomy. Soft tissue over the proximal medial tibia is subperiosteally elevated to the joint line with a  knife and into the semimembranosus bursa with a Cobb elevator. Soft tissue over the proximal lateral tibia is elevated with attention being paid to avoiding the patellar tendon on the tibial tubercle. The patella is everted, knee flexed 90 degrees and the ACL and PCL are removed. Findings are bone on bone lateral and patellofemoral with lateral osteophytes.        The drill is used to create a starting hole in the distal femur and the canal is thoroughly irrigated with sterile saline to remove the fatty contents. The 5 degree Right  valgus alignment guide is placed into the femoral canal and the distal femoral cutting block is pinned to remove 10 mm off the distal femur. Resection is made with an oscillating saw.      The tibia is subluxed forward and the menisci are removed. The extramedullary alignment guide is placed referencing proximally at the medial aspect of the tibial tubercle and distally along the second metatarsal axis and tibial crest. The block is pinned to remove 2mm off the more deficient lateral  side. Resection is made with an oscillating saw. Size 2.5is the most appropriate size for the tibia and the proximal tibia is prepared with the modular drill and keel punch for that size.      The femoral sizing guide is placed and size 2.5 is most appropriate. Rotation is marked off the epicondylar axis and confirmed by creating a rectangular flexion gap at 90 degrees. The size 2.5 cutting block is pinned in this rotation and the anterior, posterior and chamfer cuts are made with the oscillating saw. The intercondylar block is then placed  and that cut is made.      Trial size 2.5 tibial component, trial size 2.5 posterior stabilized femur and a 12.5  mm posterior stabilized rotating platform insert trial is placed. Full extension is achieved with excellent varus/valgus and anterior/posterior balance throughout full range of motion. The patella is everted and thickness measured to be 21  mm. Free hand  resection is taken to 12 mm, a 35 template is placed, lug holes are drilled, trial patella is placed, and it tracks normally. Osteophytes are removed off the posterior femur with the trial in place. All trials are removed and the cut bone surfaces prepared with pulsatile lavage. Cement is mixed and once ready for implantation, the size 2.5 tibial implant, size  2.5 posterior stabilized femoral component, and the size 35 patella are cemented in place and the patella is held with the clamp. The trial insert is placed and the knee held in full extension. The Exparel (20 ml mixed with 30 ml saline) and .25% Bupivicaine, are injected into the extensor mechanism, posterior capsule, medial and lateral gutters and subcutaneous tissues.  All extruded cement is removed and once the cement is hard the permanent 12.5 mm posterior stabilized rotating platform insert is placed into the tibial tray.      The wound is copiously irrigated with saline solution and the extensor mechanism closed over a hemovac drain with #1 PDS suture. The tourniquet is released for a total tourniquet time of 29  minutes. Flexion against gravity is 140 degrees and the patella tracks normally. Subcutaneous tissue is closed with 2.0 vicryl and subcuticular with running 4.0 Monocryl. The incision is cleaned and dried and steri-strips and a bulky sterile dressing are applied. The limb is placed into a knee immobilizer.      I then prepped the left knee with betadine under sterile conditions and injected the left knee with 80 mg Depomedrol with no problems. The patient is awakened and transported to recovery in stable condition.      Please note that a surgical assistant was a medical necessity for this procedure in order to perform it in a safe and expeditious manner. Surgical assistant was necessary to retract the ligaments and vital neurovascular structures to prevent injury to them and also necessary for proper positioning of the limb to allow for  anatomic placement of the prosthesis.   [name] Rankin Aspen Deterding, MD    10/31/2013, 4:27 PM

## 2013-10-31 NOTE — Plan of Care (Signed)
Problem: Consults Goal: Diagnosis- Total Joint Replacement Primary Total Knee     

## 2013-10-31 NOTE — Interval H&P Note (Signed)
History and Physical Interval Note:  10/31/2013 2:36 PM  [name]  has presented today for surgery, with the diagnosis of OA RIGHT KNEE   The various methods of treatment have been discussed with the patient and family. After consideration of risks, benefits and other options for treatment, the patient has consented to  Procedure(s): RIGHT TOTAL KNEE ARTHROPLASTY (Right) CORTIZONE INJECTION LEFT KNEE  (Left) as a surgical intervention .  The patient's history has been reviewed, patient examined, no change in status, stable for surgery.  I have reviewed the patient's chart and labs.  Questions were answered to the patient's satisfaction.     Loanne DrillingALUISIO,Ellisa Devivo V

## 2013-10-31 NOTE — Anesthesia Preprocedure Evaluation (Addendum)
Anesthesia Evaluation  Patient identified by MRN, date of birth, ID band Patient awake  General Assessment Comment:  PONV (postoperative nausea and vomiting)         severe   .  Peptic ulcer disease with hemorrhage  10/13/2008       admitted to hospital .had 4 blood transfusions   .  Hypertension     .  Hypothyroidism         not on meds at present. MD watching   .  Neuromuscular disorder         left knee   .  GERD (gastroesophageal reflux disease)         hx of   .  Chronic neck pain     .  History of blood transfusion  2009   .  Diabetes mellitus without complication         "borderline, not on medication"   .  H/O hiatal hernia     .  Headache(784.0)         hx of "when going thru menopause" (08/15/2012)   .  Arthritis         "shoulders, knees, right thumb" (08/15/2012)   .  Blood clot in vein  15 yrs ago       pt not sure which leg   .  Pneumonia  ~ 1989     Reviewed: Allergy & Precautions, H&P , NPO status , Patient's Chart, lab work & pertinent test results  History of Anesthesia Complications (+) PONV and history of anesthetic complications  Airway Mallampati: II TM Distance: >3 FB Neck ROM: Full    Dental no notable dental hx.    Pulmonary pneumonia -, resolved,  breath sounds clear to auscultation  Pulmonary exam normal       Cardiovascular Exercise Tolerance: Good hypertension, Pt. on medications and Pt. on home beta blockers negative cardio ROS  Rhythm:Regular Rate:Normal     Neuro/Psych  Headaches,  Neuromuscular disease negative psych ROS   GI/Hepatic negative GI ROS, Neg liver ROS, hiatal hernia, PUD, GERD-  Medicated,  Endo/Other  diabetes, Type 2Hypothyroidism Borderline DM, no meds according to chart.  She denies borderline DM  Renal/GU negative Renal ROS  negative genitourinary   Musculoskeletal negative musculoskeletal ROS (+)   Abdominal   Peds negative pediatric ROS (+)   Hematology negative hematology ROS (+)   Anesthesia Other Findings   Reproductive/Obstetrics negative OB ROS                          Anesthesia Physical Anesthesia Plan  ASA: II  Anesthesia Plan: Spinal   Post-op Pain Management:    Induction: Intravenous  Airway Management Planned:   Additional Equipment:   Intra-op Plan:   Post-operative Plan: Extubation in OR  Informed Consent: I have reviewed the patients History and Physical, chart, labs and discussed the procedure including the risks, benefits and alternatives for the proposed anesthesia with the patient or authorized representative who has indicated his/her understanding and acceptance.   Dental advisory given  Plan Discussed with: CRNA  Anesthesia Plan Comments: (Discussed r/b general versus spinal. Discussed risks/benefits of spinal including headache, backache, failure, bleeding, infection, and nerve damage. Patient consents to spinal. Questions answered. Coagulation studies and platelet count acceptable.)       Anesthesia Quick Evaluation

## 2013-10-31 NOTE — Anesthesia Postprocedure Evaluation (Signed)
  Anesthesia Post-op Note  Patient: [name] JuniperDarlene Dodson  Procedure(s) Performed: Procedure(s) (LRB): RIGHT TOTAL KNEE ARTHROPLASTY (Right) CORTIZONE INJECTION LEFT KNEE  (Left)  Patient Location: PACU  Anesthesia Type: Spinal  Level of Consciousness: awake and alert   Airway and Oxygen Therapy: Patient Spontanous Breathing  Post-op Pain: mild  Post-op Assessment: Post-op Vital signs reviewed, Patient's Cardiovascular Status Stable, Respiratory Function Stable, Patent Airway and No signs of Nausea or vomiting  Last Vitals:  165/83, 82, 100% [place]  Post-op Vital Signs: stable   Complications: No apparent anesthesia complications

## 2013-10-31 NOTE — Preoperative (Signed)
Beta Blockers   Reason not to administer Beta Blockers:Not Applicable 

## 2013-10-31 NOTE — Transfer of Care (Signed)
Immediate Anesthesia Transfer of Care Note  Patient: Ronnette JuniperDarlene Ambrosia  Procedure(s) Performed: Procedure(s) (LRB): RIGHT TOTAL KNEE ARTHROPLASTY (Right) CORTIZONE INJECTION LEFT KNEE  (Left)  Patient Location: PACU  Anesthesia Type: Spinal  Level of Consciousness: sedated, patient cooperative and responds to stimulation  Airway & Oxygen Therapy: Patient Spontanous Breathing and Patient connected to face mask oxgen  Post-op Assessment: Report given to PACU RN and Post -op Vital signs reviewed and stable  Post vital signs: Reviewed and stable  Complications: No apparent anesthesia complications

## 2013-11-01 LAB — CBC
HCT: 43.2 % (ref 36.0–46.0)
Hemoglobin: 14.2 g/dL (ref 12.0–15.0)
MCH: 29.5 pg (ref 26.0–34.0)
MCHC: 32.9 g/dL (ref 30.0–36.0)
MCV: 89.8 fL (ref 78.0–100.0)
Platelets: 305 10*3/uL (ref 150–400)
RBC: 4.81 MIL/uL (ref 3.87–5.11)
RDW: 13.2 % (ref 11.5–15.5)
WBC: 14.7 10*3/uL — ABNORMAL HIGH (ref 4.0–10.5)

## 2013-11-01 LAB — BASIC METABOLIC PANEL
BUN: 16 mg/dL (ref 6–23)
CO2: 23 meq/L (ref 19–32)
Calcium: 8.8 mg/dL (ref 8.4–10.5)
Chloride: 101 mEq/L (ref 96–112)
Creatinine, Ser: 0.76 mg/dL (ref 0.50–1.10)
GFR calc Af Amer: >90 mL/min (ref >90)
GFR, EST NON AFRICAN AMERICAN: 79 mL/min — AB (ref >90)
Glucose, Bld: 149 mg/dL — ABNORMAL HIGH (ref 70–99)
Potassium: 5.4 mEq/L — ABNORMAL HIGH (ref 3.7–5.3)
Sodium: 138 mEq/L (ref 137–147)

## 2013-11-01 MED ORDER — HYDROMORPHONE HCL 2 MG PO TABS: 2.0000 mg | ORAL_TABLET | ORAL | Status: DC | PRN

## 2013-11-01 MED ORDER — SODIUM CHLORIDE 0.9 % IJ SOLN: 3.0000 mL | INTRAMUSCULAR | Status: DC | PRN

## 2013-11-01 MED ORDER — RIVAROXABAN 10 MG PO TABS: 10.0000 mg | ORAL_TABLET | Freq: Every day | ORAL | Status: DC

## 2013-11-01 MED ORDER — TRAMADOL HCL 50 MG PO TABS
50.0000 mg | ORAL_TABLET | Freq: Four times a day (QID) | ORAL | Status: DC | PRN
Start: 2013-11-01 — End: 2013-11-03

## 2013-11-01 MED ORDER — SODIUM CHLORIDE 0.9 % IJ SOLN
3.0000 mL | Freq: Two times a day (BID) | INTRAMUSCULAR | Status: DC
  Administered 2013-11-02: 21:00:00 via INTRAVENOUS

## 2013-11-01 MED ORDER — METHOCARBAMOL 500 MG PO TABS: 500.0000 mg | ORAL_TABLET | Freq: Four times a day (QID) | ORAL | Status: DC | PRN

## 2013-11-01 NOTE — Care Management Note (Addendum)
    Page 1 of 2   11/03/2013     11:12:50 AM   CARE MANAGEMENT NOTE 11/03/2013  Patient:  [name] JuniperSAUVE,[name]   Account Number:  [id number]  Date Initiated:  11/01/2013  Documentation initiated by:  Lanier ClamMAHABIR,KATHY  Subjective/Objective Assessment:   80 Y/O F ADMITTED W/OA BILAT KNEES.     Action/Plan:   FROM HOME.[name] FOLLOWING.   Anticipated DC Date:  11/03/2013   Anticipated DC Plan:  HOME W HOME HEALTH SERVICES      DC Planning Services  CM consult      Choice offered to / List presented to:  C-1 Patient        HH arranged  HH-2 PT      [name]   Status of service:  Completed, signed off Medicare Important Message given?  NA - LOS <3 / Initial given by admissions (If response is "NO", the following Medicare IM given date fields will be blank) Date Medicare IM given:   Date Additional Medicare IM given:    Discharge Disposition:  HOME W HOME HEALTH SERVICES  Per UR Regulation:    If discussed at Long Length of Stay Meetings, dates discussed:    Comments:  11/03/2013 [name] CanLinda Leontae Dodson BSN RN CCM [phone] [name]. Pt states husband and friend will be caregivers. She already has DME/ HHpt will start day aftet pt is discharged.   11/01/13 KATHY MAHABIR RN,BSN NCM [phone] POD#1 R TKA,L KNEE CORTISONE INJECTION.HHPT ORDERED.[name] DONN AREP ALREADY FOLLOWING,ALREADY HAS ORDERS.JUST CALL [name] [name] REP WHEN D/C ORDER RECEIVED,TEL#[phone].

## 2013-11-01 NOTE — Progress Notes (Signed)
   Subjective: 1 Day Post-Op Procedure(s) (LRB): RIGHT TOTAL KNEE ARTHROPLASTY (Right) CORTIZONE INJECTION LEFT KNEE  (Left) Patient reports pain as mild.   We will start therapy today.  Plan is to go Home after hospital stay.  Objective: Vital signs in last 24 hours: Temp:  [97.3 F (36.3 C)-98.3 F (36.8 C)] 97.5 F (36.4 C) (01/10 0500) Pulse Rate:  [79-91] 82 (01/10 0500) Resp:  [11-20] 14 (01/10 0500) BP: (125-184)/(51-127) 160/81 mmHg (01/10 0500) SpO2:  [95 %-100 %] 96 % (01/10 0500) Weight:  [161 lb (73.029 kg)] 161 lb (73.029 kg) (01/09 1939)  Intake/Output from previous day:  Intake/Output Summary (Last 24 hours) at 11/01/13 0756 Last data filed at 11/01/13 0715  Gross per 24 hour  Intake   1300 ml  Output   2250 ml  Net   -950 ml    Intake/Output this shift: Total I/O In: -  Out: 150 [Urine:150]  Labs:  Recent Labs  11/01/13 0531  HGB 14.2    Recent Labs  11/01/13 0531  WBC 14.7*  RBC 4.81  HCT 43.2  PLT 305    Recent Labs  11/01/13 0531  NA 138  K 5.4*  CL 101  CO2 23  BUN 16  CREATININE 0.76  GLUCOSE 149*  CALCIUM 8.8   No results found for this basename: LABPT, INR,  in the last 72 hours  EXAM General - Patient is Alert, Appropriate and Oriented Extremity - Neurologically intact Neurovascular intact No cellulitis present Compartment soft Dressing - dressing C/D/I Motor Function - intact, moving foot and toes well on exam.  Hemovac pulled without difficulty.  Past Medical History  Diagnosis Date  . PONV (postoperative nausea and vomiting)     severe  . Peptic ulcer disease with hemorrhage 10/13/2008    admitted to hospital .had 4 blood transfusions  . Hypertension   . Hypothyroidism     not on meds at present. MD watching  . Neuromuscular disorder     left knee  . GERD (gastroesophageal reflux disease)     hx of  . Chronic neck pain   . History of blood transfusion 2009  . Diabetes mellitus without complication       "borderline, not on medication"  . H/O hiatal hernia   . Headache(784.0)     hx of "when going thru menopause" (08/15/2012)  . Arthritis     "shoulders, knees, right thumb" (08/15/2012)  . Blood clot in vein 15 yrs ago    pt not sure which leg  . Pneumonia ~ 1989    Assessment/Plan: 1 Day Post-Op Procedure(s) (LRB): RIGHT TOTAL KNEE ARTHROPLASTY (Right) CORTIZONE INJECTION LEFT KNEE  (Left) Principal Problem:   OA (osteoarthritis) of knee   Advance diet Up with therapy D/C IV fluids Plan for discharge tomorrow Dressing change tomorrow  DVT Prophylaxis - Xarelto Weight-Bearing as tolerated to right leg D/C O2 and Pulse OX and try on Room Air  Anaijah Augsburger V 11/01/2013, 7:56 AM

## 2013-11-01 NOTE — Evaluation (Signed)
Physical Therapy Evaluation Patient Details Name: [name] JuniperDarlene Dodson MRN: [id number] DOB: 03-Mar-1934 Today's Date: 11/01/2013 Time: [id number] PT Time Calculation (min): 31 min  PT Assessment / Plan / Recommendation History of Present Illness     Clinical Impression  Pt s/p R TKR presents with decreased R LE strength/ROM and post op pain limiting functional mobility.  Pt should progress to d/c home with family assist and HHPT follow up.    PT Assessment  Patient needs continued PT services    Follow Up Recommendations  Home health PT    Does the patient have the potential to tolerate intense rehabilitation      Barriers to Discharge        Equipment Recommendations  None recommended by PT    Recommendations for Other Services OT consult   Frequency 7X/week    Precautions / Restrictions Precautions Precautions: Knee;Fall Restrictions Weight Bearing Restrictions: No RLE Weight Bearing: Weight bearing as tolerated   Pertinent Vitals/Pain 5/10; premed, cold packs provided      Mobility  Bed Mobility Overal bed mobility: Needs Assistance Bed Mobility: Supine to Sit Supine to sit: Min assist;Mod assist General bed mobility comments: cues for sequence and use of L LE to self assist Transfers Overall transfer level: Needs assistance Equipment used: Rolling walker (2 wheeled) Transfers: Sit to/from Stand Sit to Stand: Mod assist General transfer comment: cues for LE management and use of UEs to self assist Ambulation/Gait Ambulation/Gait assistance: Min assist;Mod assist Ambulation Distance (Feet): 37 Feet Assistive device: Rolling walker (2 wheeled) Gait Pattern/deviations: Step-to pattern;Decreased step length - right;Decreased step length - left;Shuffle;Trunk flexed General Gait Details: cues for sequence, posture, position from RW and stride length    Exercises Total Joint Exercises Ankle Circles/Pumps: AROM;Both;15 reps;Supine Quad Sets: AROM;Both;10  reps;Supine Heel Slides: AAROM;Right;15 reps;Supine Straight Leg Raises: AAROM;Right;10 reps;Supine   PT Diagnosis: Difficulty walking  PT Problem List: Decreased strength;Decreased range of motion;Decreased activity tolerance;Decreased mobility;Decreased knowledge of use of DME;Pain PT Treatment Interventions: DME instruction;Gait training;Stair training;Functional mobility training;Therapeutic activities;Therapeutic exercise;Patient/family education     PT Goals(Current goals can be found in the care plan section) Acute Rehab PT Goals Patient Stated Goal: Resume previous lifestyle with decreased pain PT Goal Formulation: With patient Time For Goal Achievement: 11/07/13 Potential to Achieve Goals: Good  Visit Information  Last PT Received On: 11/01/13 Assistance Needed: +1       Prior Functioning  Home Living Family/patient expects to be discharged to:: Private residence Living Arrangements: Spouse/significant other Available Help at Discharge: Family Type of Home: House Home Access: Stairs to enter Secretary/administratorntrance Stairs-Number of Steps: 4 Entrance Stairs-Rails: None;Right (one entry with rail, most used entry without) Home Layout: One level Home Equipment: Environmental consultantWalker - 2 wheels Prior Function Level of Independence: Independent Communication Communication: No difficulties Dominant Hand: Right    Cognition  Cognition Arousal/Alertness: Awake/alert Behavior During Therapy: WFL for tasks assessed/performed Overall Cognitive Status: Within Functional Limits for tasks assessed    Extremity/Trunk Assessment Upper Extremity Assessment Upper Extremity Assessment: Overall WFL for tasks assessed Lower Extremity Assessment Lower Extremity Assessment: RLE deficits/detail RLE Deficits / Details: 2+/5 quads with AAROM at R knee -10 - 50   Balance    End of Session PT - End of Session Equipment Utilized During Treatment: Gait belt Activity Tolerance: Patient tolerated treatment  well Patient left: in chair;with call bell/phone within reach Nurse Communication: Mobility status CPM Right Knee CPM Right Knee: Off  GP     Filbert Craze 11/01/2013, 12:22 PM

## 2013-11-02 LAB — URINE CULTURE: Colony Count: >=100000

## 2013-11-02 LAB — BASIC METABOLIC PANEL
BUN: 21 mg/dL (ref 6–23)
CALCIUM: 8.6 mg/dL (ref 8.4–10.5)
CHLORIDE: 98 meq/L (ref 96–112)
CO2: 28 mEq/L (ref 19–32)
CREATININE: 0.9 mg/dL (ref 0.50–1.10)
GFR calc non Af Amer: 60 mL/min — ABNORMAL LOW (ref >90)
GFR, EST AFRICAN AMERICAN: 70 mL/min — AB (ref >90)
Glucose, Bld: 129 mg/dL — ABNORMAL HIGH (ref 70–99)
Potassium: 4.9 mEq/L (ref 3.7–5.3)
Sodium: 136 mEq/L — ABNORMAL LOW (ref 137–147)

## 2013-11-02 LAB — CBC
HCT: 39.1 % (ref 36.0–46.0)
Hemoglobin: 13 g/dL (ref 12.0–15.0)
MCH: 29.7 pg (ref 26.0–34.0)
MCHC: 33.2 g/dL (ref 30.0–36.0)
MCV: 89.3 fL (ref 78.0–100.0)
PLATELETS: 320 10*3/uL (ref 150–400)
RBC: 4.38 MIL/uL (ref 3.87–5.11)
RDW: 13.3 % (ref 11.5–15.5)
WBC: 15.6 10*3/uL — AB (ref 4.0–10.5)

## 2013-11-02 MED ORDER — ACETAMINOPHEN 500 MG PO TABS
1000.0000 mg | ORAL_TABLET | Freq: Two times a day (BID) | ORAL | Status: DC
  Administered 2013-11-02 – 2013-11-03 (×3): 1000 mg via ORAL
  Filled 2013-11-02 (×6): qty 2

## 2013-11-02 NOTE — Progress Notes (Signed)
Subjective: 2 Days Post-Op Procedure(s) (LRB): RIGHT TOTAL KNEE ARTHROPLASTY (Right) CORTIZONE INJECTION LEFT KNEE  (Left) Patient reports pain as 7 on 0-10 scale.  Only c/o pain.  Objective: Vital signs in last 24 hours: Temp:  [97.5 F (36.4 C)-98.7 F (37.1 C)] 97.9 F (36.6 C) (01/11 0535) Pulse Rate:  [74-90] 74 (01/11 0535) Resp:  [14-16] 16 (01/11 0535) BP: (128-177)/(84-95) 177/95 mmHg (01/11 0535) SpO2:  [90 %-96 %] 93 % (01/11 0535)  Intake/Output from previous day: 01/10 0701 - 01/11 0700 In: 1471.3 [P.O.:480; I.V.:936.3; IV Piggyback:55] Out: 550 [Urine:550] Intake/Output this shift: Total I/O In: -  Out: 200 [Urine:200]   Recent Labs  11/01/13 0531 11/02/13 0527  HGB 14.2 13.0    Recent Labs  11/01/13 0531 11/02/13 0527  WBC 14.7* 15.6*  RBC 4.81 4.38  HCT 43.2 39.1  PLT 305 320    Recent Labs  11/01/13 0531 11/02/13 0527  NA 138 136*  K 5.4* 4.9  CL 101 98  CO2 23 28  BUN 16 21  CREATININE 0.76 0.90  GLUCOSE 149* 129*  CALCIUM 8.8 8.6   No results found for this basename: LABPT, INR,  in the last 72 hours  Incision: no drainage looks good calf neg redressed  Assessment/Plan: 2 Days Post-Op Procedure(s) (LRB): RIGHT TOTAL KNEE ARTHROPLASTY (Right) CORTIZONE INJECTION LEFT KNEE  (Left) Up with therapy Continue as is not independent at this time  D/C in am  [name] 11/02/2013, 9:21 AM

## 2013-11-02 NOTE — Plan of Care (Signed)
Problem: Phase III Progression Outcomes Goal: Anticoagulant follow-up in place Outcome: Not Applicable Date Met:  82/60/88 [email or web address]

## 2013-11-02 NOTE — Evaluation (Signed)
Occupational Therapy Evaluation Patient Details Name: [name] JuniperDarlene Boies MRN: [id number] DOB: 1933/12/06 Today's Date: 11/02/2013 Time: [id number] OT Time Calculation (min): 14 min  OT Assessment / Plan / Recommendation History of present illness R TKA   Clinical Impression   Pt presents to OT with decreased I with ADL activity s/ TKR. Pt will benefit from skilled OT due to problems listed below to increase I with ADL activity and return to PLOF    OT Assessment  Patient needs continued OT Services    Follow Up Recommendations  Home health OT       Equipment Recommendations  None recommended by OT       Frequency  Min 2X/week    Precautions / Restrictions Precautions Precautions: Knee;Fall Required Braces or Orthoses: Knee Immobilizer - Right Knee Immobilizer - Right: Discontinue once straight leg raise with < 10 degree lag Restrictions RLE Weight Bearing: Weight bearing as tolerated       ADL  Grooming: Set up Where Assessed - Grooming: Unsupported sitting Upper Body Bathing: Set up Where Assessed - Upper Body Bathing: Unsupported sitting Lower Body Bathing: Moderate assistance Where Assessed - Lower Body Bathing: Supported sit to stand Upper Body Dressing: Set up Where Assessed - Upper Body Dressing: Unsupported sitting Lower Body Dressing: Moderate assistance Where Assessed - Lower Body Dressing: Supported sit to stand Toilet Transfer: Minimal assistance Toilet Transfer Method: Sit to [place] Toilet Transfer Equipment: Comfort height toilet Toileting - Clothing Manipulation and Hygiene: Minimal assistance Where Assessed - Toileting Clothing Manipulation and Hygiene: Standing ADL Comments: Md working on managing pts pain. Pt will go home next day    OT Diagnosis: Generalized weakness;Acute pain  OT Problem List: Decreased strength;Pain OT Treatment Interventions: Self-care/ADL training;Patient/family education;DME and/or AE instruction   OT Goals(Current goals can be  found in the care plan section) Acute Rehab OT Goals Patient Stated Goal: Resume previous lifestyle with decreased pain OT Goal Formulation: With patient Time For Goal Achievement: 11/16/13 Potential to Achieve Goals: Good ADL Goals Pt Will Perform Grooming: with supervision;standing Pt Will Perform Lower Body Dressing: with supervision;sit to/from stand Pt Will Transfer to Toilet: with supervision;ambulating Pt Will Perform Toileting - Clothing Manipulation and hygiene: with supervision;sit to/from stand  Visit Information  Last OT Received On: 11/02/13 Assistance Needed: +1 History of Present Illness: R TKA       Prior Functioning     Home Living Family/patient expects to be discharged to:: Private residence Living Arrangements: Spouse/significant other Available Help at Discharge: Family Type of Home: House Home Access: Stairs to enter Secretary/administratorntrance Stairs-Number of Steps: 4 Entrance Stairs-Rails: None;Right (one entry with rail, most used entry without) Home Layout: One level Home Equipment: Environmental consultantWalker - 2 wheels Prior Function Level of Independence: Independent Communication Communication: No difficulties Dominant Hand: Right         Vision/Perception Vision - History Baseline Vision: Wears glasses all the time   Cognition  Cognition Arousal/Alertness: Awake/alert Behavior During Therapy: WFL for tasks assessed/performed Overall Cognitive Status: Within Functional Limits for tasks assessed    Extremity/Trunk Assessment Lower Extremity Assessment RLE Deficits / Details: 2+/5 quads with AAROM at R knee -10 - 50     Mobility Bed Mobility Overal bed mobility: Needs Assistance Bed Mobility: Supine to Sit Supine to sit: Min assist;HOB elevated General bed mobility comments: pt uses rail; min with RLE and verbal cues for technique Transfers Overall transfer level: Needs assistance Equipment used: Rolling walker (2 wheeled) Transfers: Sit to/from Stand Sit to  Stand:  Min assist General transfer comment: multi-modal cues for hand placement and RLE position           End of Session OT - End of Session Activity Tolerance: Patient tolerated treatment well Patient left: in chair with call bell Nurse Communication: Mobility status CPM Right Knee CPM Right Knee: Off  GO     Alba Cory 11/02/2013, 10:20 AM

## 2013-11-02 NOTE — Progress Notes (Signed)
Physical Therapy Treatment Patient Details Name: [name] MRN: [id number] DOB: [birth year] Today's Date: 11/02/2013 Time: [id number] PT Time Calculation (min): 16 min  PT Assessment / Plan / Recommendation  History of Present Illness R TKA   PT Comments   Progressing , somewhat slowly, pain still and issue; MD working on meds  Follow Up Recommendations  Home health PT     Does the patient have the potential to tolerate intense rehabilitation     Barriers to Discharge        Equipment Recommendations  None recommended by PT    Recommendations for Other Services    Frequency 7X/week   Progress towards PT Goals Progress towards PT goals: Progressing toward goals  Plan Current plan remains appropriate    Precautions / Restrictions Precautions Precautions: Knee;Fall Required Braces or Orthoses: Knee Immobilizer - Right Knee Immobilizer - Right: Discontinue once straight leg raise with < 10 degree lag Restrictions RLE Weight Bearing: Weight bearing as tolerated   Pertinent Vitals/Pain 9/10,premedicated    Mobility  Bed Mobility Overal bed mobility: Needs Assistance Bed Mobility: Supine to Sit Supine to sit: Min assist;HOB elevated General bed mobility comments: pt uses rail; min with RLE and verbal cues for technique Transfers Overall transfer level: Needs assistance Equipment used: Rolling walker (2 wheeled) Transfers: Sit to/from Stand Sit to Stand: Min assist General transfer comment: multi-modal cues for hand placement and RLE position Ambulation/Gait Ambulation/Gait assistance: Min assist Ambulation Distance (Feet): 25 Feet Assistive device: Rolling walker (2 wheeled) Gait Pattern/deviations: Step-to pattern;Antalgic General Gait Details: cues for sequence, posture, position from RW and stride length    Exercises     PT Diagnosis:    PT Problem List:   PT Treatment Interventions:     PT Goals (current goals can now be found in the care plan  section) Acute Rehab PT Goals Patient Stated Goal: Resume previous lifestyle with decreased pain Time For Goal Achievement: 11/07/13 Potential to Achieve Goals: Good  Visit Information  Last PT Received On: 11/02/13 Assistance Needed: +1 History of Present Illness: R TKA    Subjective Data  Subjective: i want to get up Patient Stated Goal: Resume previous lifestyle with decreased pain   Cognition  Cognition Arousal/Alertness: Awake/alert Behavior During Therapy: WFL for tasks assessed/performed Overall Cognitive Status: Within Functional Limits for tasks assessed    Balance     End of Session PT - End of Session Equipment Utilized During Treatment: Right knee immobilizer;Gait belt Activity Tolerance: Patient tolerated treatment well Patient left: in chair;with call bell/phone within reach Nurse Communication: Mobility status   GP     [facility]ILL[facility],Caileb Rhue 11/02/2013, 9:24 AM

## 2013-11-02 NOTE — Progress Notes (Signed)
11/02/13 1300  PT Visit Information  Last PT Received On 11/02/13  Assistance Needed +1  History of Present Illness R TKA  PT Time Calculation  PT Start Time 1155  PT Stop Time 1209  PT Time Calculation (min) 14 min  Subjective Data  Subjective i want to get up  Patient Stated Goal Resume previous lifestyle with decreased pain  Precautions  Precautions Knee;Fall  Required Braces or Orthoses Knee Immobilizer - Right  Knee Immobilizer - Right Discontinue once straight leg raise with < 10 degree lag  Restrictions  RLE Weight Bearing WBAT  Cognition  Arousal/Alertness Awake/alert  Behavior During Therapy WFL for tasks assessed/performed  Overall Cognitive Status Within Functional Limits for tasks assessed  Total Joint Exercises  Ankle Circles/Pumps AROM;Both;15 reps;Supine  Quad Sets AROM;Both;10 reps;Supine  Heel Slides AAROM;Right;15 reps;Supine  Straight Leg Raises AAROM;Right;10 reps  Hip ABduction/ADduction AROM;10 reps;Right  Towel Squeeze AROM;Both;10 reps  PT - End of Session  Equipment Utilized During Treatment Right knee immobilizer;Gait belt  Activity Tolerance Patient tolerated treatment well  Patient left in chair;with call bell/phone within reach (husband on his way)  Nurse Communication Mobility status  PT - Assessment/Plan  PT Plan Current plan remains appropriate  PT Frequency 7X/week  Follow Up Recommendations Home health PT  PT equipment None recommended by PT  PT Goal Progression  Progress towards PT goals Progressing toward goals  Acute Rehab PT Goals  PT Goal Formulation With patient  Time For Goal Achievement 11/07/13  Potential to Achieve Goals Good  PT General Charges  $$ ACUTE PT VISIT 1 Procedure  PT Treatments  $Therapeutic Exercise 8-22 mins

## 2013-11-03 ENCOUNTER — Encounter (HOSPITAL_COMMUNITY): Payer: Self-pay | Admitting: Orthopedic Surgery

## 2013-11-03 LAB — CBC
HCT: 38.7 % (ref 36.0–46.0)
Hemoglobin: 12.9 g/dL (ref 12.0–15.0)
MCH: 29.9 pg (ref 26.0–34.0)
MCHC: 33.3 g/dL (ref 30.0–36.0)
MCV: 89.6 fL (ref 78.0–100.0)
PLATELETS: 295 10*3/uL (ref 150–400)
RBC: 4.32 MIL/uL (ref 3.87–5.11)
RDW: 13.6 % (ref 11.5–15.5)
WBC: 11.3 10*3/uL — ABNORMAL HIGH (ref 4.0–10.5)

## 2013-11-03 MED ORDER — HYDROMORPHONE HCL 2 MG PO TABS: 2.0000 mg | ORAL_TABLET | ORAL | Status: DC | PRN

## 2013-11-03 MED ORDER — RIVAROXABAN 10 MG PO TABS: 10.0000 mg | ORAL_TABLET | Freq: Every day | ORAL | Status: DC

## 2013-11-03 MED ORDER — TRAMADOL HCL 50 MG PO TABS: 50.0000 mg | ORAL_TABLET | Freq: Four times a day (QID) | ORAL | Status: DC | PRN

## 2013-11-03 MED ORDER — METHOCARBAMOL 500 MG PO TABS: 500.0000 mg | ORAL_TABLET | Freq: Four times a day (QID) | ORAL | Status: DC | PRN

## 2013-11-03 NOTE — Progress Notes (Signed)
   Subjective: 3 Days Post-Op Procedure(s) (LRB): RIGHT TOTAL KNEE ARTHROPLASTY (Right) CORTIZONE INJECTION LEFT KNEE  (Left) Patient reports pain as mild.   Patient seen in rounds with Dr. Lequita HaltAluisio. Patient is well, and has had no acute complaints or problems Patient is ready to go home  Objective: Vital signs in last 24 hours: Temp:  [97.4 F (36.3 C)-97.8 F (36.6 C)] 97.4 F (36.3 C) (01/12 0645) Pulse Rate:  [70-82] 82 (01/12 0645) Resp:  [16] 16 (01/12 0645) BP: (145-154)/(80-82) 154/80 mmHg (01/12 0645) SpO2:  [93 %-97 %] 97 % (01/12 0645)  Intake/Output from previous day:  Intake/Output Summary (Last 24 hours) at 11/03/13 0725 Last data filed at 11/02/13 2121  Gross per 24 hour  Intake    730 ml  Output      0 ml  Net    730 ml    Intake/Output this shift:    Labs:  Recent Labs  11/01/13 0531 11/02/13 0527  HGB 14.2 13.0    Recent Labs  11/01/13 0531 11/02/13 0527  WBC 14.7* 15.6*  RBC 4.81 4.38  HCT 43.2 39.1  PLT 305 320    Recent Labs  11/01/13 0531 11/02/13 0527  NA 138 136*  K 5.4* 4.9  CL 101 98  CO2 23 28  BUN 16 21  CREATININE 0.76 0.90  GLUCOSE 149* 129*  CALCIUM 8.8 8.6   No results found for this basename: LABPT, INR,  in the last 72 hours  EXAM: General - Patient is Alert and Appropriate Extremity - Neurovascular intact Sensation intact distally Incision - clean, dry, no drainage, healing Motor Function - intact, moving foot and toes well on exam.   Assessment/Plan: 3 Days Post-Op Procedure(s) (LRB): RIGHT TOTAL KNEE ARTHROPLASTY (Right) CORTIZONE INJECTION LEFT KNEE  (Left) Procedure(s) (LRB): RIGHT TOTAL KNEE ARTHROPLASTY (Right) CORTIZONE INJECTION LEFT KNEE  (Left) Past Medical History  Diagnosis Date  . PONV (postoperative nausea and vomiting)     severe  . Peptic ulcer disease with hemorrhage 10/13/2008    admitted to hospital .had 4 blood transfusions  . Hypertension   . Hypothyroidism     not on meds  at present. MD watching  . Neuromuscular disorder     left knee  . GERD (gastroesophageal reflux disease)     hx of  . Chronic neck pain   . History of blood transfusion 2009  . Diabetes mellitus without complication     "borderline, not on medication"  . H/O hiatal hernia   . Headache(784.0)     hx of "when going thru menopause" (08/15/2012)  . Arthritis     "shoulders, knees, right thumb" (08/15/2012)  . Blood clot in vein 15 yrs ago    pt not sure which leg  . Pneumonia ~ 1989   Principal Problem:   OA (osteoarthritis) of knee  Estimated body mass index is 27.62 kg/(m^2) as calculated from the following:   Height as of this encounter: 5\' 4"  (1.626 m).   Weight as of this encounter: 73.029 kg (161 lb). Up with therapy Discharge home with home health Diet - Cardiac diet and Diabetic diet Follow up - in 2 weeks Activity - WBAT Disposition - Home Condition Upon Discharge - Good D/C Meds - See DC Summary DVT Prophylaxis - Xarelto  PERKINS, ALEXZANDREW 11/03/2013, 7:25 AM

## 2013-11-03 NOTE — Progress Notes (Signed)
Physical Therapy Treatment Patient Details Name: [name] JuniperDarlene Carline MRN: [id number] DOB: 02-15-1934 Today's Date: 11/03/2013 Time: [id number] PT Time Calculation (min): 19 min  PT Assessment / Plan / Recommendation  History of Present Illness R TKA   PT Comments   Pt doing well this am but c/o being sleepy;   Right knee flexion grossly 10-65 degrees AAROM  Follow Up Recommendations  Home health PT     Does the patient have the potential to tolerate intense rehabilitation     Barriers to Discharge        Equipment Recommendations  None recommended by PT    Recommendations for Other Services    Frequency 7X/week   Progress towards PT Goals Progress towards PT goals: Progressing toward goals  Plan Current plan remains appropriate    Precautions / Restrictions Precautions Precautions: Knee;Fall Required Braces or Orthoses: Knee Immobilizer - Right Knee Immobilizer - Right: Discontinue once straight leg raise with < 10 degree lag Restrictions RLE Weight Bearing: Weight bearing as tolerated   Pertinent Vitals/Pain No c/opain    Mobility  Bed Mobility Bed Mobility: Supine to Sit Supine to sit: Min assist Transfers Overall transfer level: Needs assistance Equipment used: Rolling walker (2 wheeled) Transfers: Sit to/from Stand Sit to Stand: Supervision General transfer comment: incr time Ambulation/Gait Ambulation/Gait assistance: Supervision Ambulation Distance (Feet): 60 Feet Assistive device: Rolling walker (2 wheeled) Gait Pattern/deviations: Step-to pattern General Gait Details: cues for sequence, posture, position from RW and stride length Stairs: Yes Stairs assistance: Min guard Stair Management: One rail Right;Forwards;With crutches Number of Stairs: 3 General stair comments: cues for sequence    Exercises Total Joint Exercises Quad Sets: AROM;Both;10 reps Heel Slides: AROM;AAROM;Right;10 reps   PT Diagnosis:    PT Problem List:   PT Treatment Interventions:      PT Goals (current goals can now be found in the care plan section) Acute Rehab PT Goals Patient Stated Goal: Resume previous lifestyle with decreased pain Time For Goal Achievement: 11/07/13 Potential to Achieve Goals: Good  Visit Information  Last PT Received On: 11/03/13 Assistance Needed: +1 History of Present Illness: R TKA    Subjective Data  Subjective: so sleepy Patient Stated Goal: Resume previous lifestyle with decreased pain   Cognition  Cognition Arousal/Alertness: Awake/alert Behavior During Therapy: WFL for tasks assessed/performed Overall Cognitive Status: Within Functional Limits for tasks assessed    Balance     End of Session PT - End of Session Equipment Utilized During Treatment: Right knee immobilizer;Gait belt Activity Tolerance: Patient tolerated treatment well Patient left: in chair;with call bell/phone within reach Nurse Communication: Mobility status   GP     [facility] LLCWILLIAMS,Kaedan Richert 11/03/2013, 10:17 AM

## 2013-11-03 NOTE — Discharge Instructions (Signed)
° °Dr. Frank Aluisio °Total Joint Specialist °[facility] °[address] °[place], [place] [address] °[phone] ° °TOTAL KNEE REPLACEMENT POSTOPERATIVE DIRECTIONS ° ° ° °Knee Rehabilitation, Guidelines Following Surgery  °Results after knee surgery are often greatly improved when you follow the exercise, range of motion and muscle strengthening exercises prescribed by your doctor. Safety measures are also important to protect the knee from further injury. Any time any of these exercises cause you to have increased pain or swelling in your knee joint, decrease the amount until you are comfortable again and slowly increase them. If you have problems or questions, call your caregiver or physical therapist for advice.  ° °HOME CARE INSTRUCTIONS  °Remove items at home which could result in a fall. This includes throw rugs or furniture in walking pathways.  °Continue medications as instructed at time of discharge. °You may have some home medications which will be placed on hold until you complete the course of blood thinner medication.  °You may start showering once you are discharged home but do not submerge the incision under water. Just pat the incision dry and apply a dry gauze dressing on daily. °Walk with walker as instructed.  °You may resume a sexual relationship in one month or when given the OK by  your doctor.  °· Use walker as long as suggested by your caregivers. °· Avoid periods of inactivity such as sitting longer than an hour when not asleep. This helps prevent blood clots.  °You may put full weight on your legs and walk as much as is comfortable.  °You may return to work once you are cleared by your doctor.  °Do not drive a car for 6 weeks or until released by you surgeon.  °· Do not drive while taking narcotics.  °Wear the elastic stockings for three weeks following surgery during the day but you may remove then at night. °Make sure you keep all of your appointments after your  operation with all of your doctors and caregivers. You should call the office at the above phone number and make an appointment for approximately two weeks after the date of your surgery. °Change the dressing daily and reapply a dry dressing each time. °Please pick up a stool softener and laxative for home use as long as you are requiring pain medications. °· Continue to use ice on the knee for pain and swelling from surgery. You may notice swelling that will progress down to the foot and ankle.  This is normal after surgery.  Elevate the leg when you are not up walking on it.   °It is important for you to complete the blood thinner medication as prescribed by your doctor. °· Continue to use the breathing machine which will help keep your temperature down.  It is common for your temperature to cycle up and down following surgery, especially at night when you are not up moving around and exerting yourself.  The breathing machine keeps your lungs expanded and your temperature down. ° °RANGE OF MOTION AND STRENGTHENING EXERCISES  °Rehabilitation of the knee is important following a knee injury or an operation. After just a few days of immobilization, the muscles of the thigh which control the knee become weakened and shrink (atrophy). Knee exercises are designed to build up the tone and strength of the thigh muscles and to improve knee motion. Often times heat used for twenty to thirty minutes before working out will loosen up your tissues and help with improving the   range of motion but do not use heat for the first two weeks following surgery. These exercises can be done on a training (exercise) mat, on the floor, on a table or on a bed. Use what ever works the best and is most comfortable for you Knee exercises include:  °Leg Lifts - While your knee is still immobilized in a splint or cast, you can do straight leg raises. Lift the leg to 60 degrees, hold for 3 sec, and slowly lower the leg. Repeat 10-20 times 2-3  times daily. Perform this exercise against resistance later as your knee gets better.  °Quad and Hamstring Sets - Tighten up the muscle on the front of the thigh (Quad) and hold for 5-10 sec. Repeat this 10-20 times hourly. Hamstring sets are done by pushing the foot backward against an object and holding for 5-10 sec. Repeat as with quad sets.  °A rehabilitation program following serious knee injuries can speed recovery and prevent re-injury in the future due to weakened muscles. Contact your doctor or a physical therapist for more information on knee rehabilitation.  ° °SKILLED REHAB INSTRUCTIONS: °If the patient is transferred to a skilled rehab facility following release from the hospital, a list of the current medications will be sent to the facility for the patient to continue.  When discharged from the skilled rehab facility, please have the facility set up the patient's Home Health Physical Therapy prior to being released. Also, the skilled facility will be responsible for providing the patient with their medications at time of release from the facility to include their pain medication, the muscle relaxants, and their blood thinner medication. If the patient is still at the rehab facility at time of the two week follow up appointment, the skilled rehab facility will also need to assist the patient in arranging follow up appointment in our office and any transportation needs. ° °MAKE SURE YOU:  °Understand these instructions.  °Will watch your condition.  °Will get help right away if you are not doing well or get worse.  ° ° °Pick up stool softner and laxative for home. °Do not submerge incision under water. °May shower. °Continue to use ice for pain and swelling from surgery. ° °Take Xarelto for two and a half more weeks, then discontinue Xarelto. °Once the patient has completed the blood thinner regimen, then take a Baby 81 mg Aspirin daily for four more weeks. ° ° ° ° ° ° ° ° ° °

## 2013-11-03 NOTE — Progress Notes (Signed)
Occupational Therapy Treatment Patient Details Name: [name] MRN: [id number] DOB: [birth year] Today's Date: 11/03/2013 Time: [id number] OT Time Calculation (min): 24 min  OT Assessment / Plan / Recommendation     OT comments  Ready to go home from OT standpoint  Follow Up Recommendations  Home health OT    Barriers to Discharge       Equipment Recommendations  None recommended by OT          Progress towards OT Goals Progress towards OT goals: Progressing toward goals  Plan Discharge plan remains appropriate    Precautions / Restrictions Restrictions RLE Weight Bearing: Weight bearing as tolerated       ADL  Upper Body Dressing: Set up Where Assessed - Upper Body Dressing: Unsupported sitting Lower Body Dressing: Minimal assistance Where Assessed - Lower Body Dressing: Unsupported sit to stand Toilet Transfer: Min guard Toilet Transfer Method: Sit to Barist[place]and Toilet Transfer Equipment: Comfort height toilet Toileting - Clothing Manipulation and Hygiene: Supervision/safety Where Assessed - Toileting Clothing Manipulation and Hygiene: Standing ADL Comments: Husband will A as needed      OT Goals(current goals can now be found in the care plan section)    Visit Information  Last OT Received On: 11/03/13 Assistance Needed: +1          Cognition  Cognition Arousal/Alertness: Awake/alert Behavior During Therapy: WFL for tasks assessed/performed Overall Cognitive Status: Within Functional Limits for tasks assessed    Mobility  Bed Mobility Bed Mobility: Supine to Sit Supine to sit: Min assist Transfers Overall transfer level: Needs assistance Equipment used: Rolling walker (2 wheeled) Transfers: Sit to/from Stand Sit to Stand: Min assist          End of Session OT - End of Session Activity Tolerance: Patient tolerated treatment well Patient left: in chair;with call bell/phone within reach  GO     Ian Castagna D 11/03/2013, 9:04 AM

## 2013-11-03 NOTE — Discharge Summary (Signed)
Physician Discharge Summary   Patient ID: [name] MRN: [id number] DOB/AGE: 78-Aug-1935 78 y.o.  Admit date: 10/31/2013 Discharge date: 11/03/2013  Primary Diagnosis:  Osteoarthritis Bilateral knee(s)  Admission Diagnoses:  Past Medical History  Diagnosis Date  . PONV (postoperative nausea and vomiting)     severe  . Peptic ulcer disease with hemorrhage 10/13/2008    admitted to hospital .had 4 blood transfusions  . Hypertension   . Hypothyroidism     not on meds at present. MD watching  . Neuromuscular disorder     left knee  . GERD (gastroesophageal reflux disease)     hx of  . Chronic neck pain   . History of blood transfusion 2009  . Diabetes mellitus without complication     "borderline, not on medication"  . H/O hiatal hernia   . Headache(784.0)     hx of "when going thru menopause" (08/15/2012)  . Arthritis     "shoulders, knees, right thumb" (08/15/2012)  . Blood clot in vein 15 yrs ago    pt not sure which leg  . Pneumonia ~ 1989   Discharge Diagnoses:   Principal Problem:   OA (osteoarthritis) of knee Active Problems:   Hyponatremia   Postoperative anemia due to acute blood loss  Estimated body mass index is 27.62 kg/(m^2) as calculated from the following:   Height as of this encounter: _0  (1.626 m).   Weight as of this encounter: 73.029 kg (161 lb).  Procedure:  Procedure(s) (LRB): RIGHT TOTAL KNEE ARTHROPLASTY (Right) CORTIZONE INJECTION LEFT KNEE  (Left)   Consults: None  HPI: [name] is a 80 y.o. year old female with end stage OA of her right knee with progressively worsening pain and dysfunction. She has constant pain, with activity and at rest and significant functional deficits with difficulties even with ADLs. She has had extensive non-op management including analgesics, injections of cortisone and viscosupplements, and home exercise program, but remains in significant pain with significant dysfunction.Radiographs show bone on bone  arthritis lateral and patellofemoral. She presents now for right Total Knee Arthroplasty and requests cortisone injection left knee for her symptomatic arthritis on that side.   Laboratory Data: Admission on 10/31/2013, Discharged on 11/03/2013  Component Date Value Range Status  . Color, Urine 10/31/2013 YELLOW  YELLOW Final  . APPearance 10/31/2013 CLEAR  CLEAR Final  . Specific Gravity, Urine 10/31/2013 1.010  1.005 - 1.030 Final  . pH 10/31/2013 6.5  5.0 - 8.0 Final  . Glucose, UA 10/31/2013 NEGATIVE  NEGATIVE mg/dL Final  . Hgb urine dipstick 10/31/2013 NEGATIVE  NEGATIVE Final  . Bilirubin Urine 10/31/2013 NEGATIVE  NEGATIVE Final  . Ketones, ur 10/31/2013 NEGATIVE  NEGATIVE mg/dL Final  . Protein, ur 10/31/2013 NEGATIVE  NEGATIVE mg/dL Final  . Urobilinogen, UA 10/31/2013 0.2  0.0 - 1.0 mg/dL Final  . Nitrite 10/31/2013 POSITIVE* NEGATIVE Final  . Leukocytes, UA 10/31/2013 NEGATIVE  NEGATIVE Final  . Squamous Epithelial / LPF 10/31/2013 RARE  RARE Final  . WBC, UA 10/31/2013 3-6  <3 WBC/hpf Final  . Bacteria, UA 10/31/2013 MANY* RARE Final  . Specimen Description 10/31/2013 URINE, CLEAN CATCH   Final  . Special Requests 10/31/2013 NONE   Final  . Culture  Setup Time 10/31/2013    Final                   Value:10/31/2013 22:26  Performed at [facility]  . Colony Count 10/31/2013    Final                   Value:>=100,000 COLONIES/ML                         Performed at [facility]  . Culture 10/31/2013    Final                   Value:Multiple bacterial morphotypes present, none predominant. Suggest appropriate recollection if clinically indicated.                         Performed at [facility]  . Report Status 10/31/2013 11/02/2013 FINAL   Final  . WBC 11/01/2013 14.7* 4.0 - 10.5 K/uL Final  . RBC 11/01/2013 4.81  3.87 - 5.11 MIL/uL Final  . Hemoglobin 11/01/2013 14.2  12.0 - 15.0 g/dL Final  . HCT 11/01/2013 43.2  36.0 -  46.0 % Final  . MCV 11/01/2013 89.8  78.0 - 100.0 fL Final  . MCH 11/01/2013 29.5  26.0 - 34.0 pg Final  . MCHC 11/01/2013 32.9  30.0 - 36.0 g/dL Final  . RDW 11/01/2013 13.2  11.5 - 15.5 % Final  . Platelets 11/01/2013 305  150 - 400 K/uL Final  . Sodium 11/01/2013 138  137 - 147 mEq/L Final  . Potassium 11/01/2013 5.4* 3.7 - 5.3 mEq/L Final  . Chloride 11/01/2013 101  96 - 112 mEq/L Final  . CO2 11/01/2013 23  19 - 32 mEq/L Final  . Glucose, Bld 11/01/2013 149* 70 - 99 mg/dL Final  . BUN 11/01/2013 16  6 - 23 mg/dL Final  . Creatinine, Ser 11/01/2013 0.76  0.50 - 1.10 mg/dL Final  . Calcium 11/01/2013 8.8  8.4 - 10.5 mg/dL Final  . GFR calc non Af Amer 11/01/2013 79* >90 mL/min Final  . GFR calc Af Amer 11/01/2013 >90  >90 mL/min Final   Comment: (NOTE)                          The eGFR has been calculated using the CKD EPI equation.                          This calculation has not been validated in all clinical situations.                          eGFR's persistently <90 mL/min signify possible Chronic Kidney                          Disease.  . Glucose-Capillary 10/31/2013 91  70 - 99 mg/dL Final  . WBC 11/02/2013 15.6* 4.0 - 10.5 K/uL Final  . RBC 11/02/2013 4.38  3.87 - 5.11 MIL/uL Final  . Hemoglobin 11/02/2013 13.0  12.0 - 15.0 g/dL Final  . HCT 11/02/2013 39.1  36.0 - 46.0 % Final  . MCV 11/02/2013 89.3  78.0 - 100.0 fL Final  . MCH 11/02/2013 29.7  26.0 - 34.0 pg Final  . MCHC 11/02/2013 33.2  30.0 - 36.0 g/dL Final  . RDW 11/02/2013 13.3  11.5 - 15.5 % Final  . Platelets 11/02/2013 320  150 - 400 K/uL Final  . Sodium 11/02/2013 136* 137 -  147 mEq/L Final  . Potassium 11/02/2013 4.9  3.7 - 5.3 mEq/L Final  . Chloride 11/02/2013 98  96 - 112 mEq/L Final  . CO2 11/02/2013 28  19 - 32 mEq/L Final  . Glucose, Bld 11/02/2013 129* 70 - 99 mg/dL Final  . BUN 11/02/2013 21  6 - 23 mg/dL Final  . Creatinine, Ser 11/02/2013 0.90  0.50 - 1.10 mg/dL Final  . Calcium 11/02/2013  8.6  8.4 - 10.5 mg/dL Final  . GFR calc non Af Amer 11/02/2013 60* >90 mL/min Final  . GFR calc Af Amer 11/02/2013 70* >90 mL/min Final   Comment: (NOTE)                          The eGFR has been calculated using the CKD EPI equation.                          This calculation has not been validated in all clinical situations.                          eGFR's persistently <90 mL/min signify possible Chronic Kidney                          Disease.  . WBC 11/03/2013 11.3* 4.0 - 10.5 K/uL Final  . RBC 11/03/2013 4.32  3.87 - 5.11 MIL/uL Final  . Hemoglobin 11/03/2013 12.9  12.0 - 15.0 g/dL Final   RESULTS VERIFIED VIA RECOLLECT  . HCT 11/03/2013 38.7  36.0 - 46.0 % Final  . MCV 11/03/2013 89.6  78.0 - 100.0 fL Final  . MCH 11/03/2013 29.9  26.0 - 34.0 pg Final  . MCHC 11/03/2013 33.3  30.0 - 36.0 g/dL Final  . RDW 11/03/2013 13.6  11.5 - 15.5 % Final  . Platelets 11/03/2013 295  150 - 400 K/uL Final  Hospital Outpatient Visit on 10/27/2013  Component Date Value Range Status  . aPTT 10/27/2013 34  24 - 37 seconds Final  . WBC 10/27/2013 7.6  4.0 - 10.5 K/uL Final  . RBC 10/27/2013 5.08  3.87 - 5.11 MIL/uL Final  . Hemoglobin 10/27/2013 15.2* 12.0 - 15.0 g/dL Final  . HCT 10/27/2013 45.8  36.0 - 46.0 % Final  . MCV 10/27/2013 90.2  78.0 - 100.0 fL Final  . MCH 10/27/2013 29.9  26.0 - 34.0 pg Final  . MCHC 10/27/2013 33.2  30.0 - 36.0 g/dL Final  . RDW 10/27/2013 13.6  11.5 - 15.5 % Final  . Platelets 10/27/2013 303  150 - 400 K/uL Final  . Sodium 10/27/2013 137  137 - 147 mEq/L Final  . Potassium 10/27/2013 4.6  3.7 - 5.3 mEq/L Final  . Chloride 10/27/2013 97  96 - 112 mEq/L Final  . CO2 10/27/2013 29  19 - 32 mEq/L Final  . Glucose, Bld 10/27/2013 121* 70 - 99 mg/dL Final  . BUN 10/27/2013 20  6 - 23 mg/dL Final  . Creatinine, Ser 10/27/2013 0.97  0.50 - 1.10 mg/dL Final  . Calcium 10/27/2013 9.5  8.4 - 10.5 mg/dL Final  . Total Protein 10/27/2013 7.0  6.0 - 8.3 g/dL Final  .  Albumin 10/27/2013 3.7  3.5 - 5.2 g/dL Final  . AST 10/27/2013 20  0 - 37 U/L Final  . ALT 10/27/2013 17  0 - 35 U/L Final  .  Alkaline Phosphatase 10/27/2013 100  39 - 117 U/L Final  . Total Bilirubin 10/27/2013 0.4  0.3 - 1.2 mg/dL Final  . GFR calc non Af Amer 10/27/2013 55* >90 mL/min Final  . GFR calc Af Amer 10/27/2013 64* >90 mL/min Final   Comment: (NOTE)                          The eGFR has been calculated using the CKD EPI equation.                          This calculation has not been validated in all clinical situations.                          eGFR's persistently <90 mL/min signify possible Chronic Kidney                          Disease.  Marland Kitchen Prothrombin Time 10/27/2013 13.1  11.6 - 15.2 seconds Final  . INR 10/27/2013 1.01  0.00 - 1.49 Final  . ABO/RH(D) 10/27/2013 A POS   Final  . Antibody Screen 10/27/2013 NEG   Final  . Sample Expiration 10/27/2013 11/03/2013   Final  . MRSA, PCR 10/27/2013 NEGATIVE  NEGATIVE Final  . Staphylococcus aureus 10/27/2013 POSITIVE* NEGATIVE Final   Comment:                                 The Xpert SA Assay (FDA                          approved for NASAL specimens                          in patients over 8 years of age),                          is one component of                          a comprehensive surveillance                          program.  Test performance has                          been validated by [employer] for patients greater                          than or equal to 18 year old.                          It is not intended                          to diagnose infection nor to  guide or monitor treatment.  . ABO/RH(D) 10/27/2013 A POS   Final     X-Rays:Dg Chest 2 View  10/27/2013   CLINICAL DATA:  Preop right knee arthroplasty  EXAM: CHEST  2 VIEW  COMPARISON:  07/03/2012  FINDINGS: Normal heart size. Numerous calcified mediastinal lymph nodes are identified. No  pleural effusion or edema. No airspace consolidation identified. Chronic interstitial coarsening appears similar to previous exam. There is a moderate size hiatal hernia. Previous left shoulder arthroplasty. Degenerative changes are noted involving the right glenohumeral joint.  IMPRESSION: 1. Prior granulomatous disease. 2. Chronic interstitial coarsening 3. Hiatal hernia.   Electronically Signed   By: Kerby Moors M.D.   On: 10/27/2013 14:18    EKG: Orders placed during the hospital encounter of 08/15/12  . EKG     Hospital Course: [name] is a 80 y.o. who was admitted to [facility]. They were brought to the operating room on 10/31/2013 and underwent Procedure(s): RIGHT TOTAL KNEE ARTHROPLASTY CORTIZONE INJECTION LEFT KNEE .  Patient tolerated the procedure well and was later transferred to the recovery room and then to the orthopaedic floor for postoperative care.  They were given PO and IV analgesics for pain control following their surgery.  They were given 24 hours of postoperative antibiotics of  Anti-infectives   Start     Dose/Rate Route Frequency Ordered Stop   11/01/13 0600  ceFAZolin (ANCEF) IVPB 2 g/50 mL premix     2 g 100 mL/hr over 30 Minutes Intravenous On call to O.R. 10/31/13 1245 10/31/13 1520   10/31/13 2100  ceFAZolin (ANCEF) IVPB 1 g/50 mL premix     1 g 100 mL/hr over 30 Minutes Intravenous Every 6 hours 10/31/13 1808 11/01/13 0310     and started on DVT prophylaxis in the form of Xarelto.   PT and OT were ordered for total joint protocol.  Discharge planning consulted to help with postop disposition and equipment needs.  Patient had a decent night on the evening of surgery with only mild pain.  They started to get up OOB with therapy on day one. Hemovac drain was pulled without difficulty.  Continued to work with therapy into day two.  Dressing was changed on day two and the incision was healing well.  By day three, the patient had progressed with therapy  and meeting their goals.  Incision was healing well.  Patient was seen in rounds and was ready to go home.   Discharge Medications: Prior to Admission medications   Medication Sig Start Date End Date Taking? Authorizing Provider  alendronate (FOSAMAX) 70 MG tablet Take 70 mg by mouth every 7 (seven) days. Wednesdays.  Take with a full glass of water on an empty stomach.   Yes Historical Provider, MD  hydrochlorothiazide (HYDRODIURIL) 25 MG tablet Take 25 mg by mouth daily with breakfast.    Yes Historical Provider, MD  metoprolol tartrate (LOPRESSOR) 25 MG tablet Take 25 mg by mouth 2 (two) times daily.   Yes Historical Provider, MD  Calcium Carbonate-Vitamin D (CALCIUM + D PO) Take 1 tablet by mouth daily.    Historical Provider, MD  cholecalciferol (VITAMIN D) 1000 UNITS tablet Take 1,000 Units by mouth daily.    Historical Provider, MD  Cyanocobalamin (VITAMIN B12 PO) Take 1 tablet by mouth daily.    Historical Provider, MD  HYDROmorphone (DILAUDID) 2 MG tablet Take 1-2 tablets (2-4 mg total) by mouth every 4 (four) hours as needed for severe pain. 11/03/13  Gale Hulse Dara Lords, PA-C  methocarbamol (ROBAXIN) 500 MG tablet Take 1 tablet (500 mg total) by mouth every 6 (six) hours as needed for muscle spasms. 11/03/13   Kenetra Hildenbrand Dara Lords, PA-C  Multiple Vitamins-Minerals (MULTIVITAMIN WITH MINERALS) tablet Take 1 tablet by mouth daily.    Historical Provider, MD  NIACIN PO Take 1 tablet by mouth daily.    Historical Provider, MD  rivaroxaban (XARELTO) 10 MG TABS tablet Take 1 tablet (10 mg total) by mouth daily with breakfast. Take Xarelto for two and a half more weeks, then discontinue Xarelto. Once the patient has completed the blood thinner regimen, then take a Baby 81 mg Aspirin daily for four more weeks. 11/03/13   Kashana Breach, PA-C  traMADol (ULTRAM) 50 MG tablet Take 1-2 tablets (50-100 mg total) by mouth every 6 (six) hours as needed for moderate pain. 11/03/13   Xzaria Teo, PA-C  vitamin C (ASCORBIC ACID) 500 MG tablet Take 500 mg by mouth daily.    Historical Provider, MD   Discharge home with home health  Diet - Cardiac diet and Diabetic diet  Follow up - in 2 weeks  Activity - WBAT  Disposition - Home  Condition Upon Discharge - Good  D/C Meds - See DC Summary  DVT Prophylaxis - Xarelto       Discharge Orders   Future Orders Complete By Expires   Call MD / Call 911  As directed    Comments:     If you experience chest pain or shortness of breath, CALL 911 and be transported to the hospital emergency room.  If you develope a fever above 101 F, pus (white drainage) or increased drainage or redness at the wound, or calf pain, call your surgeon's office.   Change dressing  As directed    Comments:     Change dressing daily with sterile 4 x 4 inch gauze dressing and apply TED hose. Do not submerge the incision under water.   Constipation Prevention  As directed    Comments:     Drink plenty of fluids.  Prune juice may be helpful.  You may use a stool softener, such as Colace (over the counter) 100 mg twice a day.  Use MiraLax (over the counter) for constipation as needed.   Diet - low sodium heart healthy  As directed    Diet Carb Modified  As directed    Discharge instructions  As directed    Comments:     Pick up stool softner and laxative for home. Do not submerge incision under water. May shower. Continue to use ice for pain and swelling from surgery. Take Xarelto for two and a half more weeks, then discontinue Xarelto. Once the patient has completed the blood thinner regimen, then take a Baby 81 mg Aspirin daily for four more weeks.   Do not put a pillow under the knee. Place it under the heel.  As directed    Do not sit on low chairs, stoools or toilet seats, as it may be difficult to get up from low surfaces  As directed    Driving restrictions  As directed    Comments:     No driving until released by the physician.   Increase  activity slowly as tolerated  As directed    Lifting restrictions  As directed    Comments:     No lifting until released by the physician.   Patient may shower  As directed    Comments:  You may shower without a dressing once there is no drainage.  Do not wash over the wound.  If drainage remains, do not shower until drainage stops.   TED hose  As directed    Comments:     Use stockings (TED hose) for 3 weeks on both leg(s).  You may remove them at night for sleeping.   Weight bearing as tolerated  As directed    Questions:     Laterality:     Extremity:         Medication List    STOP taking these medications       alendronate 70 MG tablet  Commonly known as:  FOSAMAX      TAKE these medications       CALCIUM + D PO  Take 1 tablet by mouth daily.     cholecalciferol 1000 UNITS tablet  Commonly known as:  VITAMIN D  Take 1,000 Units by mouth daily.     hydrochlorothiazide 25 MG tablet  Commonly known as:  HYDRODIURIL  Take 25 mg by mouth daily with breakfast.     HYDROmorphone 2 MG tablet  Commonly known as:  DILAUDID  Take 1-2 tablets (2-4 mg total) by mouth every 4 (four) hours as needed for severe pain.     methocarbamol 500 MG tablet  Commonly known as:  ROBAXIN  Take 1 tablet (500 mg total) by mouth every 6 (six) hours as needed for muscle spasms.     metoprolol tartrate 25 MG tablet  Commonly known as:  LOPRESSOR  Take 25 mg by mouth 2 (two) times daily.     multivitamin with minerals tablet  Take 1 tablet by mouth daily.     NIACIN PO  Take 1 tablet by mouth daily.     rivaroxaban 10 MG Tabs tablet  Commonly known as:  XARELTO  - Take 1 tablet (10 mg total) by mouth daily with breakfast. Take Xarelto for two and a half more weeks, then discontinue Xarelto.  - Once the patient has completed the blood thinner regimen, then take a Baby 81 mg Aspirin daily for four more weeks.     traMADol 50 MG tablet  Commonly known as:  ULTRAM  Take 1-2  tablets (50-100 mg total) by mouth every 6 (six) hours as needed for moderate pain.     VITAMIN B12 PO  Take 1 tablet by mouth daily.     vitamin C 500 MG tablet  Commonly known as:  ASCORBIC ACID  Take 500 mg by mouth daily.       Follow-up Information   Follow up with Gearlean Alf, MD. Schedule an appointment as soon as possible for a visit on 11/13/2013. (Call [phone] tomorrow to make the appointment)    Specialty:  Orthopedic Surgery   Contact information:   [address] [place] [address] [phone]       Signed: Mickel Crow 11/06/2013, 11:18 AM

## 2013-11-06 DIAGNOSIS — E871 Hypo-osmolality and hyponatremia: Secondary | ICD-10-CM

## 2013-11-06 DIAGNOSIS — D62 Acute posthemorrhagic anemia: Secondary | ICD-10-CM

## 2013-11-24 ENCOUNTER — Encounter: Payer: Self-pay | Admitting: Orthopedic Surgery

## 2014-02-03 LAB — HM DEXA SCAN

## 2014-02-06 ENCOUNTER — Ambulatory Visit: Payer: Self-pay | Admitting: Family Medicine

## 2014-02-06 LAB — HM MAMMOGRAPHY

## 2014-07-14 ENCOUNTER — Other Ambulatory Visit: Payer: Self-pay | Admitting: Orthopedic Surgery

## 2014-07-14 NOTE — Progress Notes (Signed)
Preoperative surgical orders have been place into the Epic hospital system for [name] on 07/14/2014, 1:24 PM  by Patrica Duel for surgery on 08-12-2014.  Preop Total Hip - Anterior Approach orders including Experel Injecion, IV Tylenol, and IV Decadron as long as there are no contraindications to the above medications. Avel Peace, PA-C

## 2014-08-04 ENCOUNTER — Encounter (HOSPITAL_COMMUNITY): Payer: Self-pay | Admitting: Pharmacy Technician

## 2014-08-04 ENCOUNTER — Other Ambulatory Visit: Payer: Self-pay | Admitting: Surgical

## 2014-08-04 MED ORDER — TRANEXAMIC ACID 100 MG/ML IV SOLN: 2000.0000 mg | Freq: Once | INTRAVENOUS | Status: AC

## 2014-08-04 NOTE — H&P (Signed)
TOTAL HIP ADMISSION H&P  Patient is admitted for left total hip arthroplasty.  Subjective:  Chief Complaint: left hip pain  HPI: [name], 80 y.o. female, has a history of pain and functional disability in the left hip(s) due to arthritis and patient has failed non-surgical conservative treatments for greater than 12 weeks to include NSAID's and/or analgesics, corticosteriod injections, use of assistive devices and activity modification.  Onset of symptoms was gradual starting 2 years ago with gradually worsening course since that time.The patient noted no past surgery on the left hip(s).  Patient currently rates pain in the left hip at 7 out of 10 with activity. Patient has night pain, worsening of pain with activity and weight bearing, pain that interfers with activities of daily living, pain with passive range of motion and crepitus. Patient has evidence of periarticular osteophytes and joint space narrowing by imaging studies. This condition presents safety issues increasing the risk of falls. There is no current active infection.  Patient Active Problem List   Diagnosis Date Noted  . Hyponatremia 11/06/2013  . Postoperative anemia due to acute blood loss 11/06/2013  . OA (osteoarthritis) of knee 10/31/2013   Past Medical History  Diagnosis Date  . PONV (postoperative nausea and vomiting)     severe  . Peptic ulcer disease with hemorrhage 10/13/2008    admitted to hospital .had 4 blood transfusions  . Hypertension   . Hypothyroidism     not on meds at present. MD watching  . Neuromuscular disorder     left knee  . GERD (gastroesophageal reflux disease)     hx of  . Chronic neck pain   . History of blood transfusion 2009  . Diabetes mellitus without complication     "borderline, not on medication"  . H/O hiatal hernia   . Headache(784.0)     hx of "when going thru menopause" (08/15/2012)  . Arthritis     "shoulders, knees, right thumb" (08/15/2012)  . Blood clot in  vein 15 yrs ago    pt not sure which leg  . Pneumonia ~ 1989    Past Surgical History  Procedure Laterality Date  . Reverse total shoulder arthroplasty  08/15/2012    left  . Tonsillectomy and adenoidectomy  ~ 1945  . Nasal polyp excision  ~ 1946  . Shoulder surgery  ~ 2003    "bone spur removed; left side"  . Reverse shoulder arthroplasty  08/15/2012    Procedure: REVERSE SHOULDER ARTHROPLASTY;  Surgeon: Senaida LangeKevin M Supple, MD;  Location: [facility] OR;  Service: Orthopedics;  Laterality: Left;  . Total knee arthroplasty Right 10/31/2013    Procedure: RIGHT TOTAL KNEE ARTHROPLASTY;  Surgeon: Loanne DrillingFrank V Aluisio, MD;  Location: [facility];  Service: Orthopedics;  Laterality: Right;  . Injection knee Left 10/31/2013    Procedure: CORTIZONE INJECTION LEFT KNEE ;  Surgeon: Loanne DrillingFrank V Aluisio, MD;  Location: [facility];  Service: Orthopedics;  Laterality: Left;  injected at 1624 under anesthesia     Current outpatient prescriptions: aspirin 325 MG tablet, Take 325 mg by mouth once as needed for moderate pain., Disp: , Rfl: ;   Calcium Carbonate-Vitamin D (CALCIUM + D PO), Take 1 tablet by mouth daily., Disp: , Rfl: ;   cholecalciferol (VITAMIN D) 1000 UNITS tablet, Take 1,000 Units by mouth daily., Disp: , Rfl: ;   Cyanocobalamin (VITAMIN B12 PO), Take 1 tablet by mouth daily., Disp: , Rfl:  hydrochlorothiazide (MICROZIDE) 12.5 MG capsule, Take 25 mg by mouth  every morning., Disp: , Rfl: ;   HYDROcodone-acetaminophen (NORCO) 10-325 MG per tablet, Take 1 tablet by mouth 4 (four) times daily as needed for moderate pain., Disp: , Rfl: ;   latanoprost (XALATAN) 0.005 % ophthalmic solution, Place 1 drop into both eyes at bedtime., Disp: , Rfl:  metoprolol tartrate (LOPRESSOR) 25 MG tablet, Take 25 mg by mouth 2 (two) times daily., Disp: , Rfl: ;   Multiple Vitamins-Minerals (MULTIVITAMIN WITH MINERALS) tablet, Take 1 tablet by mouth every morning. , Disp: , Rfl: ;   NIACIN PO, Take 1 tablet by mouth every morning. , Disp: ,  Rfl: ;   traMADol (ULTRAM) 50 MG tablet, Take 50-100 mg by mouth every 6 (six) hours as needed for moderate pain., Disp: , Rfl:  vitamin C (ASCORBIC ACID) 500 MG tablet, Take 500 mg by mouth daily at 12 noon. , Disp: , Rfl:   Allergies  Allergen Reactions  . Morphine And Related Other (See Comments)    Makes me crazy  . Scallops [Shellfish Allergy] Nausea And Vomiting    Able to eat shrimp and other shellfish  . Vicodin [Hydrocodone-Acetaminophen] Nausea Only    History  Substance Use Topics  . Smoking status: Never Smoker   . Smokeless tobacco: Never Used  . Alcohol Use: Yes     Comment: wine rarely      Review of Systems  Constitutional: Negative.   HENT: Negative.   Eyes: Negative.   Respiratory: Negative.   Cardiovascular: Negative.   Gastrointestinal: Positive for constipation. Negative for heartburn, nausea, vomiting, abdominal pain, diarrhea, blood in stool and melena.  Genitourinary: Positive for frequency. Negative for dysuria, urgency, hematuria and flank pain.       Positive for incontinence  Musculoskeletal: Positive for joint pain. Negative for back pain, falls, myalgias and neck pain.       Left hip pain  Skin: Negative.   Neurological: Negative.   Endo/Heme/Allergies: Negative.   Psychiatric/Behavioral: Negative.     Objective:  Physical Exam  Constitutional: She is oriented to person, place, and time. She appears well-developed and well-nourished. No distress.  HENT:  Head: Normocephalic and atraumatic.  Right Ear: External ear normal.  Left Ear: External ear normal.  Nose: Nose normal.  Mouth/Throat: Oropharynx is clear and moist.  Eyes: Conjunctivae and EOM are normal.  Neck: Normal range of motion. Neck supple.  Cardiovascular: Normal rate, regular rhythm, normal heart sounds and intact distal pulses.   No murmur heard. Respiratory: Effort normal and breath sounds normal. No respiratory distress. She has no wheezes.  GI: Soft. Bowel sounds are  normal. She exhibits no distension. There is no tenderness.  Musculoskeletal:       Right hip: Normal.       Left hip: She exhibits decreased range of motion, decreased strength and crepitus.       Right knee: Normal.       Left knee: Normal.       Right lower leg: She exhibits no tenderness and no swelling.       Left lower leg: She exhibits no tenderness and no swelling.  Evaluation of her right hip reveals a normal range of motion with no discomfort. The left hip flexion to about 95, minimal internal rotation. There is about 20-30 degrees of external rotation and 30 degrees of abduction. She does not have any popping on range of motion. The right knee looks fantastic. Range of motion is 0-120 with no swelling, tenderness or instability.  Neurological:  She is alert and oriented to person, place, and time. She has normal strength and normal reflexes. No sensory deficit.  Skin: No rash noted. She is not diaphoretic. No erythema.  Psychiatric: She has a normal mood and affect. Her behavior is normal.   Vitals  Weight: 155 lb Height: 64in Body Surface Area: 1.78 m Body Mass Index: 26.61 kg/m Pulse: 68 (Regular)  BP: 126/72 (Sitting, Left Arm, Standard)  Imaging Review Plain radiographs demonstrate severe degenerative joint disease of the left hip(s). The bone quality appears to be good for age and reported activity level.  Assessment/Plan:  End stage arthritis, left hip(s)  The patient history, physical examination, clinical judgement of the provider and imaging studies are consistent with end stage degenerative joint disease of the left hip(s) and total hip arthroplasty is deemed medically necessary. The treatment options including medical management, injection therapy, arthroscopy and arthroplasty were discussed at length. The risks and benefits of total hip arthroplasty were presented and reviewed. The risks due to aseptic loosening, infection, stiffness,  dislocation/subluxation,  thromboembolic complications and other imponderables were discussed.  The patient acknowledged the explanation, agreed to proceed with the plan and consent was signed. Patient is being admitted for inpatient treatment for surgery, pain control, PT, OT, prophylactic antibiotics, VTE prophylaxis, progressive ambulation and ADL's and discharge planning.The patient is planning to be discharged home with home health services   Topical TXA  PCP: Lorie Phenix  Dimitri Ped, PA-C

## 2014-08-05 ENCOUNTER — Encounter (HOSPITAL_COMMUNITY)
Admission: RE | Admit: 2014-08-05 | Discharge: 2014-08-05 | Disposition: A | Discharge status: Home / Self Care | Payer: Medicare Other | Source: Ambulatory Visit | Attending: Orthopedic Surgery | Admitting: Orthopedic Surgery

## 2014-08-05 ENCOUNTER — Encounter (HOSPITAL_COMMUNITY): Payer: Self-pay

## 2014-08-05 ENCOUNTER — Ambulatory Visit (HOSPITAL_COMMUNITY)
Admission: RE | Admit: 2014-08-05 | Discharge: 2014-08-05 | Disposition: A | Discharge status: Home / Self Care | Payer: Medicare Other | Source: Ambulatory Visit | Attending: Orthopedic Surgery | Admitting: Orthopedic Surgery

## 2014-08-05 DIAGNOSIS — M1612 Unilateral primary osteoarthritis, left hip: Secondary | ICD-10-CM | POA: Diagnosis not present

## 2014-08-05 DIAGNOSIS — Z01818 Encounter for other preprocedural examination: Secondary | ICD-10-CM | POA: Insufficient documentation

## 2014-08-05 LAB — PROTIME-INR
INR: 1.04 (ref 0.00–1.49)
Prothrombin Time: 13.7 seconds (ref 11.6–15.2)

## 2014-08-05 LAB — COMPREHENSIVE METABOLIC PANEL
ALK PHOS: 116 U/L (ref 39–117)
ALT: 14 U/L (ref 0–35)
ANION GAP: 13 (ref 5–15)
AST: 16 U/L (ref 0–37)
Albumin: 3.9 g/dL (ref 3.5–5.2)
BUN: 23 mg/dL (ref 6–23)
CHLORIDE: 98 meq/L (ref 96–112)
CO2: 26 mEq/L (ref 19–32)
Calcium: 9.6 mg/dL (ref 8.4–10.5)
Creatinine, Ser: 1.03 mg/dL (ref 0.50–1.10)
GFR calc non Af Amer: 51 mL/min — ABNORMAL LOW (ref >90)
GFR, EST AFRICAN AMERICAN: 59 mL/min — AB (ref >90)
GLUCOSE: 85 mg/dL (ref 70–99)
POTASSIUM: 5.1 meq/L (ref 3.7–5.3)
Sodium: 137 mEq/L (ref 137–147)
Total Bilirubin: 0.5 mg/dL (ref 0.3–1.2)
Total Protein: 7.5 g/dL (ref 6.0–8.3)

## 2014-08-05 LAB — URINALYSIS, ROUTINE W REFLEX MICROSCOPIC
Bilirubin Urine: NEGATIVE
GLUCOSE, UA: NEGATIVE mg/dL
Hgb urine dipstick: NEGATIVE
Ketones, ur: NEGATIVE mg/dL
Nitrite: POSITIVE — AB
PH: 5.5 (ref 5.0–8.0)
Protein, ur: NEGATIVE mg/dL
Specific Gravity, Urine: 1.02 (ref 1.005–1.030)
Urobilinogen, UA: 0.2 mg/dL (ref 0.0–1.0)

## 2014-08-05 LAB — CBC
HEMATOCRIT: 41.8 % (ref 36.0–46.0)
HEMOGLOBIN: 13.5 g/dL (ref 12.0–15.0)
MCH: 28.9 pg (ref 26.0–34.0)
MCHC: 32.3 g/dL (ref 30.0–36.0)
MCV: 89.5 fL (ref 78.0–100.0)
Platelets: 387 10*3/uL (ref 150–400)
RBC: 4.67 MIL/uL (ref 3.87–5.11)
RDW: 12.9 % (ref 11.5–15.5)
WBC: 8.3 10*3/uL (ref 4.0–10.5)

## 2014-08-05 LAB — SURGICAL PCR SCREEN
MRSA, PCR: NEGATIVE
Staphylococcus aureus: POSITIVE — AB

## 2014-08-05 LAB — APTT: aPTT: 34 seconds (ref 24–37)

## 2014-08-05 LAB — URINE MICROSCOPIC-ADD ON

## 2014-08-05 NOTE — Patient Instructions (Addendum)
[facility]  ON:  Wednesday  10/21  REPORT TO  SHORT STAY CENTER AT:  11:00 AM    DO NOT EAT OR DRINK ANYTHING AFTER MIDNIGHT THE NIGHT BEFORE YOUR SURGERY.  YOU MAY BRUSH YOUR TEETH, RINSE OUT YOUR MOUTH--BUT NO WATER, NO FOOD, NO CHEWING GUM, NO MINTS, NO CANDIES, NO CHEWING TOBACCO.   PLEASE TAKE THE FOLLOWING MEDICATIONS THE AM OF YOUR SURGERY WITH A FEW SIPS OF WATER:   METOPROLOL.   HYDROCODONE / ACETAMINOPHEN FOR PAIN  DO NOT BRING VALUABLES, MONEY, CREDIT CARDS.  DO NOT WEAR JEWELRY, MAKE-UP, NAIL POLISH AND NO METAL PINS OR CLIPS IN YOUR HAIR. CONTACT LENS, DENTURES / PARTIALS, GLASSES SHOULD NOT BE WORN TO SURGERY AND IN MOST CASES-HEARING AIDS WILL NEED TO BE REMOVED.  BRING YOUR GLASSES CASE, ANY EQUIPMENT NEEDED FOR YOUR CONTACT LENS. FOR PATIENTS ADMITTED TO THE HOSPITAL--CHECK OUT TIME THE DAY OF DISCHARGE IS 11:00 AM.  ALL INPATIENT ROOMS ARE PRIVATE - WITH BATHROOM, TELEPHONE, TELEVISION AND WIFI INTERNET.    PLEASE BE AWARE THAT YOU MAY NEED ADDITIONAL BLOOD DRAWN DAY OF YOUR SURGERY  _______________________________________________________________________   [facility] Pre[facility] Before surgery, you can play an important role.  Because skin is not sterile, your skin needs to be as free of germs as possible.  You can reduce the number of germs on your skin by washing with CHG (chlorahexidine gluconate) soap before surgery.  CHG is an antiseptic cleaner which kills germs and bonds with the skin to continue killing germs even after washing. Please DO NOT use if you have an allergy to CHG or antibacterial soaps.  If your skin becomes reddened/irritated stop using the CHG and inform your nurse when you arrive at Short Stay. Do not shave (including legs and underarms) for at least 48 hours prior to the first CHG shower.  You may shave your face/neck. Please follow these instructions carefully:  1.  Shower with CHG  Soap the night before surgery and the  morning of Surgery.  2.  If you choose to wash your hair, wash your hair first as usual with your  normal  shampoo.  3.  After you shampoo, rinse your hair and body thoroughly to remove the  shampoo.                           4.  Use CHG as you would any other liquid soap.  You can apply chg directly  to the skin and wash                       Gently with a scrungie or clean washcloth.  5.  Apply the CHG Soap to your body ONLY FROM THE NECK DOWN.   Do not use on face/ open                           Wound or open sores. Avoid contact with eyes, ears mouth and genitals (private parts).                       Wash face,  Genitals (private parts) with your normal soap.             6.  Wash thoroughly, paying special attention to the area where your surgery  will be performed.  7.  Thoroughly rinse your body with warm water from the neck down.  8.  DO NOT shower/wash with your normal soap after using and rinsing off  the CHG Soap.                9.  Pat yourself dry with a clean towel.            10.  Wear clean pajamas.            11.  Place clean sheets on your bed the night of your first shower and do not  sleep with pets. Day of Surgery : Do not apply any lotions/deodorants the morning of surgery.  Please wear clean clothes to the hospital/surgery center.  FAILURE TO FOLLOW THESE INSTRUCTIONS MAY RESULT IN THE CANCELLATION OF YOUR SURGERY PATIENT SIGNATURE_________________________________  NURSE SIGNATURE__________________________________  ________________________________________________________________________   Rogelia MireIncentive Spirometer  An incentive spirometer is a tool that can help keep your lungs clear and active. This tool measures how well you are filling your lungs with each breath. Taking long deep breaths may help reverse or decrease the chance of developing breathing (pulmonary) problems (especially infection) following:  A long period of time  when you are unable to move or be active. BEFORE THE PROCEDURE   If the spirometer includes an indicator to show your best effort, your nurse or respiratory therapist will set it to a desired goal.  If possible, sit up straight or lean slightly forward. Try not to slouch.  Hold the incentive spirometer in an upright position. INSTRUCTIONS FOR USE  1. Sit on the edge of your bed if possible, or sit up as far as you can in bed or on a chair. 2. Hold the incentive spirometer in an upright position. 3. Breathe out normally. 4. Place the mouthpiece in your mouth and seal your lips tightly around it. 5. Breathe in slowly and as deeply as possible, raising the piston or the ball toward the top of the column. 6. Hold your breath for 3-5 seconds or for as long as possible. Allow the piston or ball to fall to the bottom of the column. 7. Remove the mouthpiece from your mouth and breathe out normally. 8. Rest for a few seconds and repeat Steps 1 through 7 at least 10 times every 1-2 hours when you are awake. Take your time and take a few normal breaths between deep breaths. 9. The spirometer may include an indicator to show your best effort. Use the indicator as a goal to work toward during each repetition. 10. After each set of 10 deep breaths, practice coughing to be sure your lungs are clear. If you have an incision (the cut made at the time of surgery), support your incision when coughing by placing a pillow or rolled up towels firmly against it. Once you are able to get out of bed, walk around indoors and cough well. You may stop using the incentive spirometer when instructed by your caregiver.  RISKS AND COMPLICATIONS  Take your time so you do not get dizzy or light-headed.  If you are in pain, you may need to take or ask for pain medication before doing incentive spirometry. It is harder to take a deep breath if you are having pain. AFTER USE  Rest and breathe slowly and easily.  It can be  helpful to keep track of a log of your progress. Your caregiver can provide you with a simple table to help with this. If you are using the  spirometer at home, follow these instructions: SEEK MEDICAL CARE IF:   You are having difficultly using the spirometer.  You have trouble using the spirometer as often as instructed.  Your pain medication is not giving enough relief while using the spirometer.  You develop fever of 100.5 F (38.1 C) or higher. SEEK IMMEDIATE MEDICAL CARE IF:   You cough up bloody sputum that had not been present before.  You develop fever of 102 F (38.9 C) or greater.  You develop worsening pain at or near the incision site. MAKE SURE YOU:   Understand these instructions.  Will watch your condition.  Will get help right away if you are not doing well or get worse. Document Released: 02/19/2007 Document Revised: 01/01/2012 Document Reviewed: 04/22/2007 [employer] Patient Information 2014 [employer], [place].   ________________________________________________________________________  WHAT IS A BLOOD TRANSFUSION? Blood Transfusion Information  A transfusion is the replacement of blood or some of its parts. Blood is made up of multiple cells which provide different functions.  Red blood cells carry oxygen and are used for blood loss replacement.  White blood cells fight against infection.  Platelets control bleeding.  Plasma helps clot blood.  Other blood products are available for specialized needs, such as hemophilia or other clotting disorders. BEFORE THE TRANSFUSION  Who gives blood for transfusions?   Healthy volunteers who are fully evaluated to make sure their blood is safe. This is blood bank blood. Transfusion therapy is the safest it has ever been in the practice of medicine. Before blood is taken from a donor, a complete history is taken to make sure that person has no history of diseases nor engages in risky social behavior (examples are  intravenous drug use or sexual activity with multiple partners). The donor's travel history is screened to minimize risk of transmitting infections, such as malaria. The donated blood is tested for signs of infectious diseases, such as HIV and hepatitis. The blood is then tested to be sure it is compatible with you in order to minimize the chance of a transfusion reaction. If you or a relative donates blood, this is often done in anticipation of surgery and is not appropriate for emergency situations. It takes many days to process the donated blood. RISKS AND COMPLICATIONS Although transfusion therapy is very safe and saves many lives, the main dangers of transfusion include:   Getting an infectious disease.  Developing a transfusion reaction. This is an allergic reaction to something in the blood you were given. Every precaution is taken to prevent this. The decision to have a blood transfusion has been considered carefully by your caregiver before blood is given. Blood is not given unless the benefits outweigh the risks. AFTER THE TRANSFUSION  Right after receiving a blood transfusion, you will usually feel much better and more energetic. This is especially true if your red blood cells have gotten low (anemic). The transfusion raises the level of the red blood cells which carry oxygen, and this usually causes an energy increase.  The nurse administering the transfusion will monitor you carefully for complications. HOME CARE INSTRUCTIONS  No special instructions are needed after a transfusion. You may find your energy is better. Speak with your caregiver about any limitations on activity for underlying diseases you may have. SEEK MEDICAL CARE IF:   Your condition is not improving after your transfusion.  You develop redness or irritation at the intravenous (IV) site. SEEK IMMEDIATE MEDICAL CARE IF:  Any of the following symptoms occur over the  next 12 hours:  Shaking chills.  You have a  temperature by mouth above 102 F (38.9 C), not controlled by medicine.  Chest, back, or muscle pain.  People around you feel you are not acting correctly or are confused.  Shortness of breath or difficulty breathing.  Dizziness and fainting.  You get a rash or develop hives.  You have a decrease in urine output.  Your urine turns a dark color or changes to pink, red, or brown. Any of the following symptoms occur over the next 10 days:  You have a temperature by mouth above 102 F (38.9 C), not controlled by medicine.  Shortness of breath.  Weakness after normal activity.  The white part of the eye turns yellow (jaundice).  You have a decrease in the amount of urine or are urinating less often.  Your urine turns a dark color or changes to pink, red, or brown. Document Released: 10/06/2000 Document Revised: 01/01/2012 Document Reviewed: 05/25/2008 [facility] Patient Information 2014 [place], [place].  _______________________________________________________________________

## 2014-08-05 NOTE — Pre-Procedure Instructions (Signed)
PT HAS EKG REPORT ON HER CHART FROM HER MEDICAL DOCTOR  - DR. MALONEY - DONE 09/25/13 AND NOTE OF MEDICAL CLEARANCE. CXR REPORT IN EPIC FROM 10-27-13.

## 2014-08-05 NOTE — Pre-Procedure Instructions (Signed)
PREOP ABNORMAL URINALYSIS AND URINE MICROSCOPIC REPORTS FAXED TO DR. ALUISIO'S OFFICE VIA EPIC FAX. PREOP PCR REPORT FAXED TO DR. ALUISIO'S OFFICE VIA EPIC FAX WITH NOTE THAT PT AWARE RX MUPIROCIN CALLED INTO HER DRUG STORE.

## 2014-08-06 NOTE — Pre-Procedure Instructions (Signed)
PT'S MORE RECENT EKG REPORT 07/27/14 RECEIVED FROM DR. MALONEY AND ON PT'S CHART.

## 2014-08-07 NOTE — Pre-Procedure Instructions (Signed)
FAXED NOTE ON CHART FROM DR. ALUISIO'S OFFICE THAT PT WAS CALLED TO TAKE CIPRO FOR UTI.

## 2014-08-11 ENCOUNTER — Ambulatory Visit: Payer: Self-pay | Admitting: Orthopedic Surgery

## 2014-08-12 ENCOUNTER — Encounter (HOSPITAL_COMMUNITY): Payer: Medicare Other | Admitting: Anesthesiology

## 2014-08-12 ENCOUNTER — Inpatient Hospital Stay (HOSPITAL_COMMUNITY): Payer: Medicare Other

## 2014-08-12 ENCOUNTER — Inpatient Hospital Stay (HOSPITAL_COMMUNITY): Payer: Medicare Other | Admitting: Anesthesiology

## 2014-08-12 ENCOUNTER — Encounter (HOSPITAL_COMMUNITY)
Admission: RE | Disposition: A | Discharge status: Home Health | Payer: Self-pay | Source: Ambulatory Visit | Attending: Orthopedic Surgery

## 2014-08-12 ENCOUNTER — Encounter (HOSPITAL_COMMUNITY): Payer: Self-pay

## 2014-08-12 ENCOUNTER — Inpatient Hospital Stay (HOSPITAL_COMMUNITY)
Admission: RE | Admit: 2014-08-12 | Discharge: 2014-08-14 | DRG: 470 | Disposition: A | Discharge status: Home Health | Payer: Medicare Other | Source: Ambulatory Visit | Attending: Orthopedic Surgery | Admitting: Orthopedic Surgery

## 2014-08-12 DIAGNOSIS — Z7982 Long term (current) use of aspirin: Secondary | ICD-10-CM

## 2014-08-12 DIAGNOSIS — Z79899 Other long term (current) drug therapy: Secondary | ICD-10-CM | POA: Diagnosis not present

## 2014-08-12 DIAGNOSIS — E039 Hypothyroidism, unspecified: Secondary | ICD-10-CM | POA: Diagnosis present

## 2014-08-12 DIAGNOSIS — I10 Essential (primary) hypertension: Secondary | ICD-10-CM | POA: Diagnosis present

## 2014-08-12 DIAGNOSIS — G8929 Other chronic pain: Secondary | ICD-10-CM | POA: Diagnosis present

## 2014-08-12 DIAGNOSIS — Z96651 Presence of right artificial knee joint: Secondary | ICD-10-CM | POA: Diagnosis present

## 2014-08-12 DIAGNOSIS — Z8711 Personal history of peptic ulcer disease: Secondary | ICD-10-CM | POA: Diagnosis not present

## 2014-08-12 DIAGNOSIS — M1612 Unilateral primary osteoarthritis, left hip: Principal | ICD-10-CM | POA: Diagnosis present

## 2014-08-12 DIAGNOSIS — M542 Cervicalgia: Secondary | ICD-10-CM | POA: Diagnosis present

## 2014-08-12 DIAGNOSIS — Z6826 Body mass index (BMI) 26.0-26.9, adult: Secondary | ICD-10-CM | POA: Diagnosis not present

## 2014-08-12 DIAGNOSIS — Z96649 Presence of unspecified artificial hip joint: Secondary | ICD-10-CM

## 2014-08-12 DIAGNOSIS — M169 Osteoarthritis of hip, unspecified: Secondary | ICD-10-CM | POA: Diagnosis present

## 2014-08-12 DIAGNOSIS — M25552 Pain in left hip: Secondary | ICD-10-CM | POA: Diagnosis present

## 2014-08-12 HISTORY — PX: TOTAL HIP ARTHROPLASTY: SHX124

## 2014-08-12 LAB — GLUCOSE, CAPILLARY: Glucose-Capillary: 94 mg/dL (ref 70–99)

## 2014-08-12 LAB — TYPE AND SCREEN
ABO/RH(D): A POS
Antibody Screen: NEGATIVE

## 2014-08-12 SURGERY — ARTHROPLASTY, HIP, TOTAL, ANTERIOR APPROACH
Anesthesia: Spinal | Site: Hip | Laterality: Left

## 2014-08-12 MED ORDER — ONDANSETRON HCL 4 MG/2ML IJ SOLN
INTRAMUSCULAR | Status: DC | PRN
  Administered 2014-08-12: 4 mg via INTRAVENOUS

## 2014-08-12 MED ORDER — METHOCARBAMOL 500 MG PO TABS
500.0000 mg | ORAL_TABLET | Freq: Four times a day (QID) | ORAL | Status: DC | PRN
  Administered 2014-08-12 – 2014-08-14 (×3): 500 mg via ORAL
  Filled 2014-08-12 (×3): qty 1

## 2014-08-12 MED ORDER — RIVAROXABAN 10 MG PO TABS
10.0000 mg | ORAL_TABLET | Freq: Every day | ORAL | Status: DC
  Administered 2014-08-13 – 2014-08-14 (×2): 10 mg via ORAL
  Filled 2014-08-12 (×3): qty 1

## 2014-08-12 MED ORDER — ACETAMINOPHEN 650 MG RE SUPP: 650.0000 mg | Freq: Four times a day (QID) | RECTAL | Status: DC | PRN

## 2014-08-12 MED ORDER — BUPIVACAINE LIPOSOME 1.3 % IJ SUSP
20.0000 mL | Freq: Once | INTRAMUSCULAR | Status: DC
  Filled 2014-08-12: qty 20

## 2014-08-12 MED ORDER — HYDROCHLOROTHIAZIDE 12.5 MG PO CAPS
25.0000 mg | ORAL_CAPSULE | Freq: Every day | ORAL | Status: DC
  Administered 2014-08-13 – 2014-08-14 (×2): 25 mg via ORAL
  Filled 2014-08-12 (×2): qty 2

## 2014-08-12 MED ORDER — SODIUM CHLORIDE 0.9 % IV SOLN: INTRAVENOUS | Status: DC

## 2014-08-12 MED ORDER — MIDAZOLAM HCL 5 MG/5ML IJ SOLN
INTRAMUSCULAR | Status: DC | PRN
  Administered 2014-08-12: 2 mg via INTRAVENOUS

## 2014-08-12 MED ORDER — FENTANYL CITRATE 0.05 MG/ML IJ SOLN
INTRAMUSCULAR | Status: AC
  Filled 2014-08-12: qty 2

## 2014-08-12 MED ORDER — FLEET ENEMA 7-19 GM/118ML RE ENEM: 1.0000 | ENEMA | Freq: Once | RECTAL | Status: AC | PRN

## 2014-08-12 MED ORDER — DEXTROSE-NACL 5-0.9 % IV SOLN
INTRAVENOUS | Status: DC
  Administered 2014-08-13: 09:00:00 via INTRAVENOUS

## 2014-08-12 MED ORDER — BUPIVACAINE HCL (PF) 0.25 % IJ SOLN
INTRAMUSCULAR | Status: AC
  Filled 2014-08-12: qty 30

## 2014-08-12 MED ORDER — PHENOL 1.4 % MT LIQD: 1.0000 | OROMUCOSAL | Status: DC | PRN

## 2014-08-12 MED ORDER — CEFAZOLIN SODIUM-DEXTROSE 2-3 GM-% IV SOLR
2.0000 g | INTRAVENOUS | Status: AC
  Administered 2014-08-12: 2 g via INTRAVENOUS

## 2014-08-12 MED ORDER — BUPIVACAINE HCL (PF) 0.5 % IJ SOLN
INTRAMUSCULAR | Status: DC | PRN
  Administered 2014-08-12: 3 mL

## 2014-08-12 MED ORDER — PROPOFOL 10 MG/ML IV BOLUS
INTRAVENOUS | Status: AC
  Filled 2014-08-12: qty 20

## 2014-08-12 MED ORDER — PROPOFOL INFUSION 10 MG/ML OPTIME
INTRAVENOUS | Status: DC | PRN
  Administered 2014-08-12: 25 ug/kg/min via INTRAVENOUS

## 2014-08-12 MED ORDER — ONDANSETRON HCL 4 MG PO TABS
4.0000 mg | ORAL_TABLET | Freq: Four times a day (QID) | ORAL | Status: DC | PRN
Start: 2014-08-12 — End: 2014-08-14

## 2014-08-12 MED ORDER — SODIUM CHLORIDE 0.9 % IJ SOLN
INTRAMUSCULAR | Status: AC
  Filled 2014-08-12: qty 50

## 2014-08-12 MED ORDER — BUPIVACAINE LIPOSOME 1.3 % IJ SUSP
INTRAMUSCULAR | Status: DC | PRN
  Administered 2014-08-12: 20 mL

## 2014-08-12 MED ORDER — LACTATED RINGERS IV SOLN
INTRAVENOUS | Status: DC
  Administered 2014-08-12: 15:00:00 via INTRAVENOUS
  Administered 2014-08-12: 1000 mL via INTRAVENOUS

## 2014-08-12 MED ORDER — TRANEXAMIC ACID 100 MG/ML IV SOLN
2000.0000 mg | Freq: Once | INTRAVENOUS | Status: DC
  Filled 2014-08-12: qty 20

## 2014-08-12 MED ORDER — METOPROLOL TARTRATE 25 MG PO TABS
25.0000 mg | ORAL_TABLET | Freq: Two times a day (BID) | ORAL | Status: DC
  Administered 2014-08-12 – 2014-08-14 (×4): 25 mg via ORAL
  Filled 2014-08-12 (×5): qty 1

## 2014-08-12 MED ORDER — CEFAZOLIN SODIUM-DEXTROSE 2-3 GM-% IV SOLR
2.0000 g | Freq: Four times a day (QID) | INTRAVENOUS | Status: AC
  Administered 2014-08-12 – 2014-08-13 (×2): 2 g via INTRAVENOUS
  Filled 2014-08-12 (×3): qty 50

## 2014-08-12 MED ORDER — ONDANSETRON HCL 4 MG/2ML IJ SOLN: 4.0000 mg | Freq: Four times a day (QID) | INTRAMUSCULAR | Status: DC | PRN

## 2014-08-12 MED ORDER — ACETAMINOPHEN 10 MG/ML IV SOLN
1000.0000 mg | Freq: Once | INTRAVENOUS | Status: AC
  Administered 2014-08-12: 1000 mg via INTRAVENOUS
  Filled 2014-08-12: qty 100

## 2014-08-12 MED ORDER — METOCLOPRAMIDE HCL 5 MG/ML IJ SOLN: 5.0000 mg | Freq: Three times a day (TID) | INTRAMUSCULAR | Status: DC | PRN

## 2014-08-12 MED ORDER — BUPIVACAINE HCL (PF) 0.25 % IJ SOLN
INTRAMUSCULAR | Status: DC | PRN
  Administered 2014-08-12: 20 mL

## 2014-08-12 MED ORDER — METHOCARBAMOL 1000 MG/10ML IJ SOLN
500.0000 mg | Freq: Four times a day (QID) | INTRAVENOUS | Status: DC | PRN
  Filled 2014-08-12: qty 5

## 2014-08-12 MED ORDER — CHLORHEXIDINE GLUCONATE 4 % EX LIQD: 60.0000 mL | Freq: Once | CUTANEOUS | Status: DC

## 2014-08-12 MED ORDER — ACETAMINOPHEN 325 MG PO TABS
650.0000 mg | ORAL_TABLET | Freq: Four times a day (QID) | ORAL | Status: DC | PRN
  Administered 2014-08-13: 650 mg via ORAL
  Filled 2014-08-12: qty 2

## 2014-08-12 MED ORDER — SODIUM CHLORIDE 0.9 % IJ SOLN
INTRAMUSCULAR | Status: DC | PRN
  Administered 2014-08-12: 30 mL

## 2014-08-12 MED ORDER — MEPERIDINE HCL 50 MG/ML IJ SOLN
6.2500 mg | INTRAMUSCULAR | Status: DC | PRN
Start: 2014-08-12 — End: 2014-08-12

## 2014-08-12 MED ORDER — BUPIVACAINE HCL (PF) 0.5 % IJ SOLN
INTRAMUSCULAR | Status: AC
  Filled 2014-08-12: qty 30

## 2014-08-12 MED ORDER — FENTANYL CITRATE 0.05 MG/ML IJ SOLN: 25.0000 ug | INTRAMUSCULAR | Status: DC | PRN

## 2014-08-12 MED ORDER — CEFAZOLIN SODIUM-DEXTROSE 2-3 GM-% IV SOLR
INTRAVENOUS | Status: AC
  Filled 2014-08-12: qty 50

## 2014-08-12 MED ORDER — HYDROMORPHONE HCL 1 MG/ML IJ SOLN
0.5000 mg | INTRAMUSCULAR | Status: DC | PRN
  Administered 2014-08-13: 1 mg via INTRAVENOUS
  Filled 2014-08-12: qty 1

## 2014-08-12 MED ORDER — DEXAMETHASONE SODIUM PHOSPHATE 10 MG/ML IJ SOLN
10.0000 mg | Freq: Once | INTRAMUSCULAR | Status: DC
  Filled 2014-08-12: qty 1

## 2014-08-12 MED ORDER — ACETAMINOPHEN 500 MG PO TABS
1000.0000 mg | ORAL_TABLET | Freq: Four times a day (QID) | ORAL | Status: AC
  Administered 2014-08-12 – 2014-08-13 (×4): 1000 mg via ORAL
  Filled 2014-08-12 (×4): qty 2

## 2014-08-12 MED ORDER — POLYETHYLENE GLYCOL 3350 17 G PO PACK: 17.0000 g | PACK | Freq: Every day | ORAL | Status: DC | PRN

## 2014-08-12 MED ORDER — PROMETHAZINE HCL 25 MG/ML IJ SOLN: 6.2500 mg | INTRAMUSCULAR | Status: DC | PRN

## 2014-08-12 MED ORDER — DEXAMETHASONE SODIUM PHOSPHATE 10 MG/ML IJ SOLN
INTRAMUSCULAR | Status: AC
  Filled 2014-08-12: qty 1

## 2014-08-12 MED ORDER — MIDAZOLAM HCL 2 MG/2ML IJ SOLN
INTRAMUSCULAR | Status: AC
  Filled 2014-08-12: qty 2

## 2014-08-12 MED ORDER — MENTHOL 3 MG MT LOZG: 1.0000 | LOZENGE | OROMUCOSAL | Status: DC | PRN

## 2014-08-12 MED ORDER — LATANOPROST 0.005 % OP SOLN
1.0000 [drp] | Freq: Every day | OPHTHALMIC | Status: DC
  Filled 2014-08-12: qty 2.5

## 2014-08-12 MED ORDER — FENTANYL CITRATE 0.05 MG/ML IJ SOLN
INTRAMUSCULAR | Status: DC | PRN
  Administered 2014-08-12 (×2): 25 ug via INTRAVENOUS

## 2014-08-12 MED ORDER — KETOROLAC TROMETHAMINE 15 MG/ML IJ SOLN
7.5000 mg | Freq: Four times a day (QID) | INTRAMUSCULAR | Status: AC | PRN
  Administered 2014-08-13: 7.5 mg via INTRAVENOUS
  Filled 2014-08-12: qty 1

## 2014-08-12 MED ORDER — METOPROLOL TARTRATE 25 MG PO TABS
25.0000 mg | ORAL_TABLET | Freq: Once | ORAL | Status: AC
  Administered 2014-08-12: 25 mg via ORAL
  Filled 2014-08-12 (×2): qty 1

## 2014-08-12 MED ORDER — DEXAMETHASONE SODIUM PHOSPHATE 10 MG/ML IJ SOLN
10.0000 mg | Freq: Once | INTRAMUSCULAR | Status: AC
  Administered 2014-08-12: 10 mg via INTRAVENOUS

## 2014-08-12 MED ORDER — DIPHENHYDRAMINE HCL 12.5 MG/5ML PO ELIX
12.5000 mg | ORAL_SOLUTION | ORAL | Status: DC | PRN
  Administered 2014-08-12: 25 mg via ORAL
  Administered 2014-08-13: 12.5 mg via ORAL
  Filled 2014-08-12: qty 10
  Filled 2014-08-12 (×2): qty 5

## 2014-08-12 MED ORDER — DOCUSATE SODIUM 100 MG PO CAPS
100.0000 mg | ORAL_CAPSULE | Freq: Two times a day (BID) | ORAL | Status: DC
  Administered 2014-08-12 – 2014-08-13 (×3): 100 mg via ORAL

## 2014-08-12 MED ORDER — ONDANSETRON HCL 4 MG/2ML IJ SOLN
INTRAMUSCULAR | Status: AC
  Filled 2014-08-12: qty 2

## 2014-08-12 MED ORDER — HYDROMORPHONE HCL 2 MG PO TABS
2.0000 mg | ORAL_TABLET | ORAL | Status: DC | PRN
  Administered 2014-08-13 (×3): 2 mg via ORAL
  Administered 2014-08-13: 4 mg via ORAL
  Administered 2014-08-13: 2 mg via ORAL
  Administered 2014-08-14 (×2): 4 mg via ORAL
  Filled 2014-08-12: qty 1
  Filled 2014-08-12 (×2): qty 2
  Filled 2014-08-12: qty 1
  Filled 2014-08-12: qty 2
  Filled 2014-08-12 (×2): qty 1

## 2014-08-12 MED ORDER — METOCLOPRAMIDE HCL 10 MG PO TABS: 5.0000 mg | ORAL_TABLET | Freq: Three times a day (TID) | ORAL | Status: DC | PRN

## 2014-08-12 MED ORDER — BISACODYL 10 MG RE SUPP: 10.0000 mg | Freq: Every day | RECTAL | Status: DC | PRN

## 2014-08-12 SURGICAL SUPPLY — 38 items
BAG ZIPLOCK 12X15 (MISCELLANEOUS) IMPLANT
BLADE EXTENDED COATED 6.5IN (ELECTRODE) ×3 IMPLANT
BLADE SAG 18X100X1.27 (BLADE) ×3 IMPLANT
CAPT HIP PF MOP ×3 IMPLANT
CLOSURE WOUND 1/2 X4 (GAUZE/BANDAGES/DRESSINGS) ×1
COVER PERINEAL POST (MISCELLANEOUS) ×3 IMPLANT
DECANTER SPIKE VIAL GLASS SM (MISCELLANEOUS) ×3 IMPLANT
DRAPE C-ARM 42X120 X-RAY (DRAPES) ×3 IMPLANT
DRAPE STERI IOBAN 125X83 (DRAPES) ×3 IMPLANT
DRAPE U-SHAPE 47X51 STRL (DRAPES) ×9 IMPLANT
DRSG ADAPTIC 3X8 NADH LF (GAUZE/BANDAGES/DRESSINGS) ×3 IMPLANT
DRSG MEPILEX BORDER 4X4 (GAUZE/BANDAGES/DRESSINGS) ×3 IMPLANT
DRSG MEPILEX BORDER 4X8 (GAUZE/BANDAGES/DRESSINGS) ×3 IMPLANT
DURAPREP 26ML APPLICATOR (WOUND CARE) ×3 IMPLANT
ELECT REM PT RETURN 9FT ADLT (ELECTROSURGICAL) ×3
ELECTRODE REM PT RTRN 9FT ADLT (ELECTROSURGICAL) ×1 IMPLANT
EVACUATOR 1/8 PVC DRAIN (DRAIN) ×3 IMPLANT
FACESHIELD WRAPAROUND (MASK) ×12 IMPLANT
GLOVE BIO SURGEON STRL SZ7.5 (GLOVE) ×3 IMPLANT
GLOVE BIO SURGEON STRL SZ8 (GLOVE) ×6 IMPLANT
GLOVE BIOGEL PI IND STRL 8 (GLOVE) ×2 IMPLANT
GLOVE BIOGEL PI INDICATOR 8 (GLOVE) ×4
GOWN STRL REUS W/TWL LRG LVL3 (GOWN DISPOSABLE) ×3 IMPLANT
GOWN STRL REUS W/TWL XL LVL3 (GOWN DISPOSABLE) ×3 IMPLANT
KIT BASIN OR (CUSTOM PROCEDURE TRAY) ×3 IMPLANT
NDL SAFETY ECLIPSE 18X1.5 (NEEDLE) ×2 IMPLANT
NEEDLE HYPO 18GX1.5 SHARP (NEEDLE) ×4
PACK TOTAL JOINT (CUSTOM PROCEDURE TRAY) ×3 IMPLANT
STRIP CLOSURE SKIN 1/2X4 (GAUZE/BANDAGES/DRESSINGS) ×2 IMPLANT
SUT ETHIBOND NAB CT1 #1 30IN (SUTURE) ×3 IMPLANT
SUT MNCRL AB 4-0 PS2 18 (SUTURE) ×3 IMPLANT
SUT VIC AB 2-0 CT1 27 (SUTURE) ×4
SUT VIC AB 2-0 CT1 TAPERPNT 27 (SUTURE) ×2 IMPLANT
SUT VLOC 180 0 24IN GS25 (SUTURE) ×3 IMPLANT
SYR 20CC LL (SYRINGE) ×3 IMPLANT
SYR 50ML LL SCALE MARK (SYRINGE) ×3 IMPLANT
TOWEL OR 17X26 10 PK STRL BLUE (TOWEL DISPOSABLE) ×3 IMPLANT
TRAY FOLEY CATH 14FRSI W/METER (CATHETERS) ×3 IMPLANT

## 2014-08-12 NOTE — Anesthesia Postprocedure Evaluation (Signed)
  Anesthesia Post-op Note  Patient: [name]  Procedure(s) Performed: Procedure(s) (LRB): LEFT TOTAL HIP ARTHROPLASTY ANTERIOR APPROACH (Left)  Patient Location: PACU  Anesthesia Type: Spinal  Level of Consciousness: awake and alert   Airway and Oxygen Therapy: Patient Spontanous Breathing  Post-op Pain: mild  Post-op Assessment: Post-op Vital signs reviewed, Patient's Cardiovascular Status Stable, Respiratory Function Stable, Patent Airway and No signs of Nausea or vomiting  Last Vitals:  Filed Vitals:   08/12/14 1810  BP: 160/65  Pulse: 77  Temp: 36.7 C  Resp: 16    Post-op Vital Signs: stable   Complications: No apparent anesthesia complications

## 2014-08-12 NOTE — Transfer of Care (Signed)
Immediate Anesthesia Transfer of Care Note  Patient: Regis BillDarlene J Mcquillen  Procedure(s) Performed: Procedure(s) (LRB): LEFT TOTAL HIP ARTHROPLASTY ANTERIOR APPROACH (Left)  Patient Location: PACU  Anesthesia Type: Spinal  Level of Consciousness: sedated, patient cooperative and responds to stimulation  Airway & Oxygen Therapy: Patient Spontanous Breathing and Patient connected to face mask oxgen  Post-op Assessment: Report given to PACU RN and Post -op Vital signs reviewed and stable  Post vital signs: Reviewed and stable  Complications: No apparent anesthesia complications. T -10 level on exam released stable condition denied pain on assessment.

## 2014-08-12 NOTE — Op Note (Signed)
OPERATIVE REPORT  PREOPERATIVE DIAGNOSIS: Osteoarthritis of the Left hip.   POSTOPERATIVE DIAGNOSIS: Osteoarthritis of the Left  hip.   PROCEDURE: Left total hip arthroplasty, anterior approach.   SURGEON: Ollen GrossFrank Baby Stairs, MD   ASSISTANT: Avel Peacerew Perkins, PA-C  ANESTHESIA:  Spinal  ESTIMATED BLOOD LOSS:- 300 ml  DRAINS: Hemovac x1.   COMPLICATIONS: None   CONDITION: PACU - hemodynamically stable.   BRIEF CLINICAL NOTE: [name] is a 80 y.o. female who has advanced end-  stage arthritis of her Left  hip with progressively worsening pain and  dysfunction.The patient has failed nonoperative management and presents for  total hip arthroplasty.   PROCEDURE IN DETAIL: After successful administration of spinal  anesthetic, the traction boots [facility] were [facility] on both  feet and the [facility], boots placed into the leg  holders. The Left hip was then isolated from the perineum with plastic  drapes and prepped and draped in the usual sterile fashion. ASIS and  greater trochanter were marked and a oblique incision was made, starting  at about 1 cm lateral and 2 cm distal to the ASIS and coursing towards  the anterior cortex of the femur. The skin was cut with a 10 blade  through subcutaneous tissue to the level of the fascia overlying the  tensor fascia lata muscle. The fascia was then incised in line with the  incision at the junction of the anterior third and posterior 2/3rd. The  muscle was teased off the fascia and then the interval between the TFL  and the rectus was developed. The Hohmann retractor was then placed at  the top of the femoral neck over the capsule. The vessels overlying the  capsule were cauterized and the fat on top of the capsule was removed.  A Hohmann retractor was then placed anterior underneath the rectus  femoris to give exposure to the entire anterior capsule. A T-shaped  capsulotomy was performed. The  edges were tagged and the femoral head  was identified.       Osteophytes are removed off the superior acetabulum.  The femoral neck was then cut in situ with an oscillating saw. Traction  was then applied to the left lower extremity utilizing the [facility]  traction. The femoral head was then removed. Retractors were placed  around the acetabulum and then circumferential removal of the labrum was  performed. Osteophytes were also removed. Reaming starts at 45 mm to  medialize and  Increased in 2 mm increments to 49 mm. We reamed in  approximately 40 degrees of abduction, 20 degrees anteversion. A 50 mm  pinnacle acetabular shell was then impacted in anatomic position under  fluoroscopic guidance with excellent purchase. We did not need to place  any additional dome screws. A 32 mm neutral + 4 marathon liner was then  placed into the acetabular shell.       The femoral lift was then placed along the lateral aspect of the femur  just distal to the vastus ridge. The leg was  externally rotated and capsule  was stripped off the inferior aspect of the femoral neck down to the  level of the lesser trochanter, this was done with electrocautery. The femur was lifted after this was performed. The  leg was then placed and extended in adducted position to essentially delivering the femur. We also removed the capsule superiorly and the  piriformis from the piriformis  fossa to gain excellent exposure of the  proximal femur. Rongeur was used to remove some cancellous bone to get  into the lateral portion of the proximal femur for placement of the  initial starter reamer. The starter broaches was placed  the starter broach  and was shown to go down the center of the canal. Broaching  with the  Corail system was then performed starting at size 8, coursing  Up to size 10. A size 10 had excellent torsional and rotational  and axial stability. The trial standard offset neck was then placed  with a 32 + 1 trial  head. The hip was then reduced. We confirmed that  the stem was in the canal both on AP and lateral x-rays. It also has excellent sizing. The hip was reduced with outstanding stability through full extension, full external rotation,  and then flexion in adduction internal rotation. AP pelvis was taken  and the leg lengths were measured and found to be exactly equal. Hip  was then dislocated again and the femoral head and neck removed. The  femoral broach was removed. Size 10 Corail stem with a standard offset  neck was then impacted into the femur following native anteversion. Has  excellent purchase in the canal. Excellent torsional and rotational and  axial stability. It is confirmed to be in the canal on AP and lateral  fluoroscopic views. The 32 + 1 metal head was placed and the hip  reduced with outstanding stability. Again AP pelvis was taken and it  confirmed that the leg lengths were equal. The wound was then copiously  irrigated with saline solution and the capsule reattached and repaired  with Ethibond suture.  20 mL of Exparel mixed with 50 mL of saline then additional 20 ml of .25% Bupivicaine injected into the capsule and into the edge of the tensor fascia lata as well as subcutaneous tissue. The fascia overlying the tensor fascia lata was  then closed with a running #1 V-Loc. Subcu was closed with interrupted  2-0 Vicryl and subcuticular running 4-0 Monocryl. Incision was cleaned  and dried. Steri-Strips and a bulky sterile dressing applied. Hemovac  drain was hooked to suction and then he was awakened and transported to  recovery in stable condition.        Please note that a surgical assistant was a medical necessity for this procedure to perform it in a safe and expeditious manner. Assistant was necessary to provide appropriate retraction of vital neurovascular structures and to prevent femoral fracture and allow for anatomic placement of the prosthesis.  Ollen GrossFrank Pericles Carmicheal, M.D.

## 2014-08-12 NOTE — Interval H&P Note (Signed)
History and Physical Interval Note:  08/12/2014 12:34 PM  [name]  has presented today for surgery, with the diagnosis of OA LEFT HIP  The various methods of treatment have been discussed with the patient and family. After consideration of risks, benefits and other options for treatment, the patient has consented to  Procedure(s): LEFT TOTAL HIP ARTHROPLASTY ANTERIOR APPROACH (Left) as a surgical intervention .  The patient's history has been reviewed, patient examined, no change in status, stable for surgery.  I have reviewed the patient's chart and labs.  Questions were answered to the patient's satisfaction.     Loanne DrillingALUISIO,Floride Hutmacher V

## 2014-08-12 NOTE — Anesthesia Procedure Notes (Signed)
Spinal  Patient location during procedure: OR Start time: 08/12/2014 1:25 PM End time: 08/12/2014 1:28 PM Staffing CRNA/Resident: Carmelia RollerALDAY, Sanyiah Kanzler R Performed by: resident/CRNA  Preanesthetic Checklist Completed: patient identified, site marked, surgical consent, pre-op evaluation, timeout performed, IV checked, risks and benefits discussed and monitors and equipment checked Spinal Block Patient position: sitting Prep: Betadine Patient monitoring: heart rate Approach: right paramedian Location: L3-4 Injection technique: single-shot Needle Needle type: Spinocan  Needle gauge: 22 G Needle length: 9 cm Needle insertion depth: 7 cm Assessment Sensory level: T4

## 2014-08-12 NOTE — Anesthesia Preprocedure Evaluation (Addendum)
Anesthesia Evaluation  Patient identified by MRN, date of birth, ID band Patient awake    Reviewed: Allergy & Precautions, H&P , NPO status , Patient's Chart, lab work & pertinent test results  History of Anesthesia Complications (+) PONV  Airway Mallampati: II TM Distance: >3 FB Neck ROM: Full    Dental no notable dental hx.    Pulmonary neg pulmonary ROS,  breath sounds clear to auscultation  Pulmonary exam normal       Cardiovascular hypertension, Pt. on medications Rhythm:Regular Rate:Normal     Neuro/Psych negative neurological ROS  negative psych ROS   GI/Hepatic negative GI ROS, Neg liver ROS,   Endo/Other  negative endocrine ROS  Renal/GU negative Renal ROS  negative genitourinary   Musculoskeletal negative musculoskeletal ROS (+)   Abdominal   Peds negative pediatric ROS (+)  Hematology negative hematology ROS (+)   Anesthesia Other Findings   Reproductive/Obstetrics negative OB ROS                          Anesthesia Physical Anesthesia Plan  ASA: II  Anesthesia Plan: Spinal   Post-op Pain Management:    Induction:   Airway Management Planned: Simple Face Mask  Additional Equipment:   Intra-op Plan:   Post-operative Plan:   Informed Consent: I have reviewed the patients History and Physical, chart, labs and discussed the procedure including the risks, benefits and alternatives for the proposed anesthesia with the patient or authorized representative who has indicated his/her understanding and acceptance.   Dental advisory given  Plan Discussed with: CRNA  Anesthesia Plan Comments:         Anesthesia Quick Evaluation

## 2014-08-12 NOTE — Progress Notes (Signed)
Utilization review completed.  

## 2014-08-13 ENCOUNTER — Encounter (HOSPITAL_COMMUNITY): Payer: Self-pay | Admitting: Orthopedic Surgery

## 2014-08-13 LAB — CBC
HEMATOCRIT: 37.6 % (ref 36.0–46.0)
Hemoglobin: 12.3 g/dL (ref 12.0–15.0)
MCH: 28.6 pg (ref 26.0–34.0)
MCHC: 32.7 g/dL (ref 30.0–36.0)
MCV: 87.4 fL (ref 78.0–100.0)
Platelets: 314 10*3/uL (ref 150–400)
RBC: 4.3 MIL/uL (ref 3.87–5.11)
RDW: 12.8 % (ref 11.5–15.5)
WBC: 15.1 10*3/uL — AB (ref 4.0–10.5)

## 2014-08-13 LAB — BASIC METABOLIC PANEL
Anion gap: 11 (ref 5–15)
BUN: 18 mg/dL (ref 6–23)
CHLORIDE: 99 meq/L (ref 96–112)
CO2: 25 meq/L (ref 19–32)
CREATININE: 0.77 mg/dL (ref 0.50–1.10)
Calcium: 9.1 mg/dL (ref 8.4–10.5)
GFR calc Af Amer: >90 mL/min (ref >90)
GFR calc non Af Amer: 78 mL/min — ABNORMAL LOW (ref >90)
GLUCOSE: 138 mg/dL — AB (ref 70–99)
Potassium: 4.8 mEq/L (ref 3.7–5.3)
Sodium: 135 mEq/L — ABNORMAL LOW (ref 137–147)

## 2014-08-13 MED ORDER — HYDROMORPHONE HCL 2 MG PO TABS: 2.0000 mg | ORAL_TABLET | ORAL | Status: DC | PRN

## 2014-08-13 MED ORDER — TRAMADOL HCL 50 MG PO TABS: 50.0000 mg | ORAL_TABLET | Freq: Four times a day (QID) | ORAL | Status: DC | PRN

## 2014-08-13 MED ORDER — METHOCARBAMOL 500 MG PO TABS: 500.0000 mg | ORAL_TABLET | Freq: Four times a day (QID) | ORAL | Status: DC | PRN

## 2014-08-13 MED ORDER — RIVAROXABAN 10 MG PO TABS: 10.0000 mg | ORAL_TABLET | Freq: Every day | ORAL | Status: DC

## 2014-08-13 NOTE — Progress Notes (Signed)
   Subjective: 1 Day Post-Op Procedure(s) (LRB): LEFT TOTAL HIP ARTHROPLASTY ANTERIOR APPROACH (Left) Patient reports pain as mild.   Patient seen in rounds by Dr. Lequita HaltAluisio. Patient is well, but has had some minor complaints of pain in the hip, requiring pain medications We will start therapy today.  Plan is to go Home after hospital stay.  Objective: Vital signs in last 24 hours: Temp:  [97.5 F (36.4 C)-98.5 F (36.9 C)] 97.6 F (36.4 C) (10/22 0602) Pulse Rate:  [69-89] 76 (10/22 0602) Resp:  [12-16] 16 (10/22 0602) BP: (110-174)/(52-87) 174/87 mmHg (10/22 0602) SpO2:  [96 %-100 %] 97 % (10/22 0602) Weight:  [69.4 kg (153 lb)] 69.4 kg (153 lb) (10/21 1630)  Intake/Output from previous day:  Intake/Output Summary (Last 24 hours) at 08/13/14 0725 Last data filed at 08/13/14 0600  Gross per 24 hour  Intake 3478.75 ml  Output   2210 ml  Net 1268.75 ml    Labs:  Recent Labs  08/13/14 0447  HGB 12.3    Recent Labs  08/13/14 0447  WBC 15.1*  RBC 4.30  HCT 37.6  PLT 314    Recent Labs  08/13/14 0447  NA 135*  K 4.8  CL 99  CO2 25  BUN 18  CREATININE 0.77  GLUCOSE 138*  CALCIUM 9.1   No results found for this basename: LABPT, INR,  in the last 72 hours  EXAM General - Patient is Alert, Appropriate and Oriented Extremity - Neurovascular intact Sensation intact distally Dressing - dressing C/D/I Motor Function - intact, moving foot and toes well on exam.  Hemovac pulled without difficulty.  Past Medical History  Diagnosis Date  . PONV (postoperative nausea and vomiting)     severe  . Peptic ulcer disease with hemorrhage 10/13/2008    admitted to hospital .had 4 blood transfusions  . Hypertension   . Neuromuscular disorder     left knee  . Chronic neck pain   . History of blood transfusion 2009  . H/O hiatal hernia   . Headache(784.0)     hx of "when going thru menopause" (08/15/2012)  . Arthritis     "shoulders, knees, right thumb"  (08/15/2012)  . Blood clot in vein 15 yrs ago    pt not sure which leg  . Pneumonia ~ 1989  . Hypothyroidism     not on meds at present. MD watching  . GERD (gastroesophageal reflux disease)     hx of- NOT MUCH OF PROBLEM NOW AND NO MEDS    Assessment/Plan: 1 Day Post-Op Procedure(s) (LRB): LEFT TOTAL HIP ARTHROPLASTY ANTERIOR APPROACH (Left) Principal Problem:   OA (osteoarthritis) of hip  Estimated body mass index is 26.25 kg/(m^2) as calculated from the following:   Height as of this encounter: 5\' 4"  (1.626 m).   Weight as of this encounter: 69.4 kg (153 lb). Advance diet Up with therapy Plan for discharge tomorrow Discharge home with home health  DVT Prophylaxis - Xarelto Weight Bearing As Tolerated left Leg Hemovac Pulled Begin Therapy   Avel Peacerew Refugio Mcconico, PA-C Orthopaedic Surgery 08/13/2014, 7:25 AM

## 2014-08-13 NOTE — Discharge Summary (Signed)
Physician Discharge Summary   Patient ID: [name] MRN: [id number] DOB/AGE: [id number] y.o.  Admit date: 08/12/2014 Discharge date: 08/14/2014   Primary Diagnosis:  Osteoarthritis of the Left hip.   Admission Diagnoses:  Past Medical History  Diagnosis Date  . PONV (postoperative nausea and vomiting)     severe  . Peptic ulcer disease with hemorrhage 10/13/2008    admitted to hospital .had 4 blood transfusions  . Hypertension   . Neuromuscular disorder     left knee  . Chronic neck pain   . History of blood transfusion 2009  . H/O hiatal hernia   . Headache(784.0)     hx of "when going thru menopause" (08/15/2012)  . Arthritis     "shoulders, knees, right thumb" (08/15/2012)  . Blood clot in vein 15 yrs ago    pt not sure which leg  . Pneumonia ~ 1989  . Hypothyroidism     not on meds at present. MD watching  . GERD (gastroesophageal reflux disease)     hx of- NOT MUCH OF PROBLEM NOW AND NO MEDS   Discharge Diagnoses:   Principal Problem:   OA (osteoarthritis) of hip  Estimated body mass index is 26.25 kg/(m^2) as calculated from the following:   Height as of this encounter: 5' 4"  (1.626 m).   Weight as of this encounter: 69.4 kg (153 lb).  Procedure(s) (LRB): LEFT TOTAL HIP ARTHROPLASTY ANTERIOR APPROACH (Left)   Consults: None  HPI: [name] is a 80 y.o. female who has advanced end-  stage arthritis of her Left hip with progressively worsening pain and  dysfunction.The patient has failed nonoperative management and presents for  total hip arthroplasty.   Laboratory Data: Admission on 08/12/2014, Discharged on 08/14/2014  Component Date Value Ref Range Status  . Glucose-Capillary 08/12/2014 94  70 - 99 mg/dL Final  . Comment 1 08/12/2014 Notify RN   Final  . WBC 08/13/2014 15.1* 4.0 - 10.5 K/uL Final  . RBC 08/13/2014 4.30  3.87 - 5.11 MIL/uL Final  . Hemoglobin 08/13/2014 12.3  12.0 - 15.0 g/dL Final  . HCT 08/13/2014 37.6  36.0 - 46.0 %  Final  . MCV 08/13/2014 87.4  78.0 - 100.0 fL Final  . MCH 08/13/2014 28.6  26.0 - 34.0 pg Final  . MCHC 08/13/2014 32.7  30.0 - 36.0 g/dL Final  . RDW 08/13/2014 12.8  11.5 - 15.5 % Final  . Platelets 08/13/2014 314  150 - 400 K/uL Final  . Sodium 08/13/2014 135* 137 - 147 mEq/L Final  . Potassium 08/13/2014 4.8  3.7 - 5.3 mEq/L Final  . Chloride 08/13/2014 99  96 - 112 mEq/L Final  . CO2 08/13/2014 25  19 - 32 mEq/L Final  . Glucose, Bld 08/13/2014 138* 70 - 99 mg/dL Final  . BUN 08/13/2014 18  6 - 23 mg/dL Final  . Creatinine, Ser 08/13/2014 0.77  0.50 - 1.10 mg/dL Final  . Calcium 08/13/2014 9.1  8.4 - 10.5 mg/dL Final  . GFR calc non Af Amer 08/13/2014 78* >90 mL/min Final  . GFR calc Af Amer 08/13/2014 >90  >90 mL/min Final   Comment: (NOTE)                          The eGFR has been calculated using the CKD EPI equation.  This calculation has not been validated in all clinical situations.                          eGFR's persistently <90 mL/min signify possible Chronic Kidney                          Disease.  . Anion gap 08/13/2014 11  5 - 15 Final  . WBC 08/14/2014 15.6* 4.0 - 10.5 K/uL Final  . RBC 08/14/2014 4.32  3.87 - 5.11 MIL/uL Final  . Hemoglobin 08/14/2014 12.4  12.0 - 15.0 g/dL Final  . HCT 08/14/2014 38.6  36.0 - 46.0 % Final  . MCV 08/14/2014 89.4  78.0 - 100.0 fL Final  . MCH 08/14/2014 28.7  26.0 - 34.0 pg Final  . MCHC 08/14/2014 32.1  30.0 - 36.0 g/dL Final  . RDW 08/14/2014 12.9  11.5 - 15.5 % Final  . Platelets 08/14/2014 255  150 - 400 K/uL Final  . Sodium 08/14/2014 137  137 - 147 mEq/L Final  . Potassium 08/14/2014 4.2  3.7 - 5.3 mEq/L Final  . Chloride 08/14/2014 98  96 - 112 mEq/L Final  . CO2 08/14/2014 25  19 - 32 mEq/L Final  . Glucose, Bld 08/14/2014 117* 70 - 99 mg/dL Final  . BUN 08/14/2014 16  6 - 23 mg/dL Final  . Creatinine, Ser 08/14/2014 0.75  0.50 - 1.10 mg/dL Final  . Calcium 08/14/2014 9.1  8.4 - 10.5 mg/dL  Final  . GFR calc non Af Amer 08/14/2014 79* >90 mL/min Final  . GFR calc Af Amer 08/14/2014 >90  >90 mL/min Final   Comment: (NOTE)                          The eGFR has been calculated using the CKD EPI equation.                          This calculation has not been validated in all clinical situations.                          eGFR's persistently <90 mL/min signify possible Chronic Kidney                          Disease.  Georgiann Hahn gap 08/14/2014 14  5 - 15 Final  Hospital Outpatient Visit on 08/05/2014  Component Date Value Ref Range Status  . MRSA, PCR 08/05/2014 NEGATIVE  NEGATIVE Final  . Staphylococcus aureus 08/05/2014 POSITIVE* NEGATIVE Final   Comment:                                 The Xpert SA Assay (FDA                          approved for NASAL specimens                          in patients over 72 years of age),                          is one component of  a comprehensive surveillance                          program.  Test performance has                          been validated by [facility] for patients greater                          than or equal to 33 year old.                          It is not intended                          to diagnose infection nor to                          guide or monitor treatment.  Marland Kitchen aPTT 08/05/2014 34  24 - 37 seconds Final  . WBC 08/05/2014 8.3  4.0 - 10.5 K/uL Final  . RBC 08/05/2014 4.67  3.87 - 5.11 MIL/uL Final  . Hemoglobin 08/05/2014 13.5  12.0 - 15.0 g/dL Final  . HCT 08/05/2014 41.8  36.0 - 46.0 % Final  . MCV 08/05/2014 89.5  78.0 - 100.0 fL Final  . MCH 08/05/2014 28.9  26.0 - 34.0 pg Final  . MCHC 08/05/2014 32.3  30.0 - 36.0 g/dL Final  . RDW 08/05/2014 12.9  11.5 - 15.5 % Final  . Platelets 08/05/2014 387  150 - 400 K/uL Final  . Sodium 08/05/2014 137  137 - 147 mEq/L Final  . Potassium 08/05/2014 5.1  3.7 - 5.3 mEq/L Final  . Chloride 08/05/2014 98  96 - 112  mEq/L Final  . CO2 08/05/2014 26  19 - 32 mEq/L Final  . Glucose, Bld 08/05/2014 85  70 - 99 mg/dL Final  . BUN 08/05/2014 23  6 - 23 mg/dL Final  . Creatinine, Ser 08/05/2014 1.03  0.50 - 1.10 mg/dL Final  . Calcium 08/05/2014 9.6  8.4 - 10.5 mg/dL Final  . Total Protein 08/05/2014 7.5  6.0 - 8.3 g/dL Final  . Albumin 08/05/2014 3.9  3.5 - 5.2 g/dL Final  . AST 08/05/2014 16  0 - 37 U/L Final  . ALT 08/05/2014 14  0 - 35 U/L Final  . Alkaline Phosphatase 08/05/2014 116  39 - 117 U/L Final  . Total Bilirubin 08/05/2014 0.5  0.3 - 1.2 mg/dL Final  . GFR calc non Af Amer 08/05/2014 51* >90 mL/min Final  . GFR calc Af Amer 08/05/2014 59* >90 mL/min Final   Comment: (NOTE)                          The eGFR has been calculated using the CKD EPI equation.                          This calculation has not been validated in all clinical situations.  eGFR's persistently <90 mL/min signify possible Chronic Kidney                          Disease.  . Anion gap 08/05/2014 13  5 - 15 Final  . Prothrombin Time 08/05/2014 13.7  11.6 - 15.2 seconds Final  . INR 08/05/2014 1.04  0.00 - 1.49 Final  . ABO/RH(D) 08/05/2014 A POS   Final  . Antibody Screen 08/05/2014 NEG   Final  . Sample Expiration 08/05/2014 08/15/2014   Final  . Color, Urine 08/05/2014 YELLOW  YELLOW Final  . APPearance 08/05/2014 CLOUDY* CLEAR Final  . Specific Gravity, Urine 08/05/2014 1.020  1.005 - 1.030 Final  . pH 08/05/2014 5.5  5.0 - 8.0 Final  . Glucose, UA 08/05/2014 NEGATIVE  NEGATIVE mg/dL Final  . Hgb urine dipstick 08/05/2014 NEGATIVE  NEGATIVE Final  . Bilirubin Urine 08/05/2014 NEGATIVE  NEGATIVE Final  . Ketones, ur 08/05/2014 NEGATIVE  NEGATIVE mg/dL Final  . Protein, ur 08/05/2014 NEGATIVE  NEGATIVE mg/dL Final  . Urobilinogen, UA 08/05/2014 0.2  0.0 - 1.0 mg/dL Final  . Nitrite 08/05/2014 POSITIVE* NEGATIVE Final  . Leukocytes, UA 08/05/2014 TRACE* NEGATIVE Final  . Squamous Epithelial /  LPF 08/05/2014 RARE  RARE Final  . WBC, UA 08/05/2014 3-6  <3 WBC/hpf Final  . RBC / HPF 08/05/2014 0-2  <3 RBC/hpf Final  . Bacteria, UA 08/05/2014 MANY* RARE Final     X-Rays:Dg Hip Complete Left  08/05/2014   CLINICAL DATA:  Osteoarthritis of the left hip, pre arthroplasty.  EXAM: LEFT HIP - COMPLETE 2+ VIEW  COMPARISON:  None.  FINDINGS: Severe arthropathy of the left hip is noted, with flattening of the femoral head, full-thickness loss of craniocaudad articular space, lateral subluxation of the femoral head within the acetabulum, and woven bone formation lateral to the upper margin of the acetabulum and possibly along the iliofemoral ligament. Abnormal subcortical sclerosis especially in the left femoral head with some subcortical lucency.  Mild osteitis pubis.  IMPRESSION: 1. Severe arthropathy of the left hip. Potential immature/woven bone lateral to the acetabulum and along the iliofemoral ligament. Flattening and mild lateral subluxation of the left femoral head.   Electronically Signed   By: Sherryl Barters M.D.   On: 08/05/2014 14:32   Dg Pelvis Portable  08/12/2014   CLINICAL DATA:  Left hip replacement  EXAM: PORTABLE PELVIS 1-2 VIEWS; DG C-ARM 1-60 MIN - NRPT [facility]  COMPARISON:  None.  FINDINGS: Left hip replacement in satisfactory position alignment. No immediate complication. No fracture identified.  IMPRESSION: Satisfactory left hip replacement.   Electronically Signed   By: Franchot Gallo M.D.   On: 08/12/2014 15:44   Dg C-arm 1-60 Min-no Report  08/12/2014   CLINICAL DATA:  Left hip replacement  EXAM: PORTABLE PELVIS 1-2 VIEWS; DG C-ARM 1-60 MIN - NRPT [facility]  COMPARISON:  None.  FINDINGS: Left hip replacement in satisfactory position alignment. No immediate complication. No fracture identified.  IMPRESSION: Satisfactory left hip replacement.   Electronically Signed   By: Franchot Gallo M.D.   On: 08/12/2014 15:44    EKG: Orders placed or performed during the hospital encounter  of 08/15/12  . EKG     Hospital Course: Patient was admitted to [facility] and taken to the OR and underwent the above state procedure without complications.  Patient tolerated the procedure well and was later transferred to the recovery room and then to the orthopaedic floor  for postoperative care.  They were given PO and IV analgesics for pain control following their surgery.  They were given 24 hours of postoperative antibiotics of  Anti-infectives    Start     Dose/Rate Route Frequency Ordered Stop   08/12/14 2000  ceFAZolin (ANCEF) IVPB 2 g/50 mL premix     2 g100 mL/hr over 30 Minutes Intravenous Every 6 hours 08/12/14 1641 08/13/14 0209   08/12/14 1106  ceFAZolin (ANCEF) IVPB 2 g/50 mL premix     2 g100 mL/hr over 30 Minutes Intravenous On call to O.R. 08/12/14 1106 08/12/14 1335     and started on DVT prophylaxis in the form of Xarelto.   PT and OT were ordered for total hip protocol.  The patient was allowed to be WBAT with therapy. Discharge planning was consulted to help with postop disposition and equipment needs.  Patient had a decent night on the evening of surgery.  They started to get up OOB with therapy on day one.  Hemovac drain was pulled without difficulty.  Continued to work with therapy into day two.  Dressing was changed on day two and the incision was healing well.  Patient was seen in rounds and was ready to go home on day two.  Discharge home with home health Diet - Cardiac diet Follow up - in 2 weeks Activity - WBAT Disposition - Home Condition Upon Discharge - Good D/C Meds - See DC Summary DVT Prophylaxis - Xarelto      Discharge Instructions    Call MD / Call 911    Complete by:  As directed   If you experience chest pain or shortness of breath, CALL 911 and be transported to the hospital emergency room.  If you develope a fever above 101 F, pus (white drainage) or increased drainage or redness at the wound, or calf pain, call your surgeon's office.       Change dressing    Complete by:  As directed   You may change your dressing dressing daily with sterile 4 x 4 inch gauze dressing and paper tape.  Do not submerge the incision under water.     Constipation Prevention    Complete by:  As directed   Drink plenty of fluids.  Prune juice may be helpful.  You may use a stool softener, such as Colace (over the counter) 100 mg twice a day.  Use MiraLax (over the counter) for constipation as needed.     Diet - low sodium heart healthy    Complete by:  As directed      Discharge instructions    Complete by:  As directed   Pick up stool softner and laxative for home. Do not submerge incision under water. May shower. Continue to use ice for pain and swelling from surgery.  Total Hip Protocol.  Take Xarelto for two and a half more weeks, then discontinue Xarelto. Once the patient has completed the Xarelto, they may resume the 325 mg Aspirin.     Do not sit on low chairs, stoools or toilet seats, as it may be difficult to get up from low surfaces    Complete by:  As directed      Driving restrictions    Complete by:  As directed   No driving until released by the physician.     Increase activity slowly as tolerated    Complete by:  As directed      Lifting restrictions    Complete  by:  As directed   No lifting until released by the physician.     Patient may shower    Complete by:  As directed   You may shower without a dressing once there is no drainage.  Do not wash over the wound.  If drainage remains, do not shower until drainage stops.     TED hose    Complete by:  As directed   Use stockings (TED hose) for 3 weeks on both leg(s).  You may remove them at night for sleeping.     Weight bearing as tolerated    Complete by:  As directed             Medication List    STOP taking these medications        aspirin 325 MG tablet     CALCIUM + D PO     cholecalciferol 1000 UNITS tablet  Commonly known as:  VITAMIN D      HYDROcodone-acetaminophen 10-325 MG per tablet  Commonly known as:  NORCO     multivitamin with minerals tablet     NIACIN PO     VITAMIN B12 PO     vitamin C 500 MG tablet  Commonly known as:  ASCORBIC ACID      TAKE these medications        hydrochlorothiazide 12.5 MG capsule  Commonly known as:  MICROZIDE  Take 25 mg by mouth every morning.     HYDROmorphone 2 MG tablet  Commonly known as:  DILAUDID  Take 1-2 tablets (2-4 mg total) by mouth every 4 (four) hours as needed for moderate pain or severe pain.     latanoprost 0.005 % ophthalmic solution  Commonly known as:  XALATAN  Place 1 drop into both eyes at bedtime.     methocarbamol 500 MG tablet  Commonly known as:  ROBAXIN  Take 1 tablet (500 mg total) by mouth every 6 (six) hours as needed for muscle spasms.     metoprolol tartrate 25 MG tablet  Commonly known as:  LOPRESSOR  Take 25 mg by mouth 2 (two) times daily.     rivaroxaban 10 MG Tabs tablet  Commonly known as:  XARELTO  - Take 1 tablet (10 mg total) by mouth daily with breakfast. Take Xarelto for two and a half more weeks, then discontinue Xarelto.  - Once the patient has completed the blood thinner regimen, then take a 325 mg Aspirin daily for three more weeks.     traMADol 50 MG tablet  Commonly known as:  ULTRAM  Take 1-2 tablets (50-100 mg total) by mouth every 6 (six) hours as needed for moderate pain.       Follow-up Information    Follow up with [facility].   Why:  home health physical therapy   Contact information:   [place] [address] [place] [address] [phone]       Follow up with Gearlean Alf, MD. Schedule an appointment as soon as possible for a visit on 08/21/2014.   Specialty:  Orthopedic Surgery   Why:  Call office at [phone] to follow up on the afternoon on 08/21/2014.   Contact information:   [address] [place] [address] [phone]       Signed: Arlee Muslim,  PA-C Orthopaedic Surgery 08/27/2014, 9:12 AM

## 2014-08-13 NOTE — Plan of Care (Signed)
Problem: Consults Goal: Diagnosis- Total Joint Replacement Left anterior hip     

## 2014-08-13 NOTE — Evaluation (Signed)
Occupational Therapy Evaluation Patient Details Name: [name] MRN: [id number] DOB: 02-14-1934 Today's Date: 08/13/2014    History of Present Illness L THR   Clinical Impression   Patient evaluated by Occupational Therapy with no further acute OT needs identified. All education has been completed and the patient has no further questions. All education completed.  Pt has all AE and DME at home, and will have assist from spouse as necessary. See below for any follow-up Occupational Therapy or equipment needs. OT is signing off. Thank you for this referral.      Follow Up Recommendations  No OT follow up;Supervision - Intermittent    Equipment Recommendations  None recommended by OT    Recommendations for Other Services       Precautions / Restrictions Precautions Precautions: Fall Restrictions Weight Bearing Restrictions: No Other Position/Activity Restrictions: WBAT      Mobility Bed Mobility               General bed mobility comments: Pt sitting up in bed  Transfers Overall transfer level: Needs assistance Equipment used: Rolling walker (2 wheeled) Transfers: Sit to/from UGI CorporationStand;Stand Pivot Transfers Sit to Stand: Min assist Stand pivot transfers: Min assist       General transfer comment: verbal cues for hand placement.  Min A to steady     Balance                                            ADL Overall ADL's : Needs assistance/impaired Eating/Feeding: Independent;Sitting   Grooming: Wash/dry hands;Wash/dry face;Brushing hair;Minimal assistance;Standing   Upper Body Bathing: Set up;Sitting   Lower Body Bathing: Moderate assistance;Sit to/from stand   Upper Body Dressing : Set up;Sitting   Lower Body Dressing: Maximal assistance;Sit to/from stand Lower Body Dressing Details (indicate cue type and reason): Pt unable to bend forward to access feet. Encouraged her to attempt daily.  She uses AE for LB ADLs at home due to hip  pain PTA Toilet Transfer: Minimal assistance;Ambulation;Comfort height toilet;RW   Toileting- Clothing Manipulation and Hygiene: Minimal assistance;Sit to/from stand     Tub/Shower Transfer Details (indicate cue type and reason): Pt able to verbalize proper technique.  She uses 3in1 in shower Functional mobility during ADLs: Minimal assistance;Rolling walker General ADL Comments: Pt motivated.  Moving very well.  She is able to verbalize technique for LB ADLs     Vision                     Perception     Praxis      Pertinent Vitals/Pain Pain Assessment: 0-10 Pain Score: 4  Pain Location: Lt hip Pain Descriptors / Indicators: Aching Pain Intervention(s): Monitored during session;Premedicated before session     Hand Dominance Right   Extremity/Trunk Assessment Upper Extremity Assessment Upper Extremity Assessment: RUE deficits/detail RUE Deficits / Details: Pt reports arthritis in Rt. shoulder.  Full AROM, but pain with overhead reach    Lower Extremity Assessment Lower Extremity Assessment: Defer to PT evaluation   Cervical / Trunk Assessment Cervical / Trunk Assessment: Normal   Communication Communication Communication: No difficulties;HOH   Cognition Arousal/Alertness: Awake/alert Behavior During Therapy: WFL for tasks assessed/performed Overall Cognitive Status: Within Functional Limits for tasks assessed                     General Comments  Exercises       Shoulder Instructions      Home Living Family/patient expects to be discharged to:: Private residence Living Arrangements: Spouse/significant other Available Help at Discharge: Family Type of Home: House Home Access: Stairs to enter Secretary/administratorntrance Stairs-Number of Steps: 4 Entrance Stairs-Rails: Right;Left Home Layout: One level     Bathroom Shower/Tub: Producer, television/film/videoWalk-in shower   Bathroom Toilet: Standard Bathroom Accessibility: Yes How Accessible: Accessible via walker Home  Equipment: Walker - 2 wheels;Bedside commode;Toilet riser;Adaptive equipment Adaptive Equipment: Reacher;Sock aid;Long-handled shoe horn        Prior Functioning/Environment Level of Independence: Independent;Independent with assistive device(s)        Comments: Pt reports she has been using AE for LB ADLs    OT Diagnosis: Generalized weakness;Acute pain   OT Problem List:     OT Treatment/Interventions:      OT Goals(Current goals can be found in the care plan section)    OT Frequency:     Barriers to D/C:            Co-evaluation              End of Session Equipment Utilized During Treatment: Rolling walker Nurse Communication: Mobility status  Activity Tolerance: Patient tolerated treatment well Patient left: in chair;with call bell/phone within reach   Time: 1216-1230 OT Time Calculation (min): 14 min Charges:  OT General Charges $OT Visit: 1 Procedure OT Evaluation $Initial OT Evaluation Tier I: 1 Procedure G-Codes:    [name], Ursula AlertWendi M 08/13/2014, 1:42 PM

## 2014-08-13 NOTE — Care Management Note (Signed)
    Page 1 of 2   08/13/2014     12:51:08 PM CARE MANAGEMENT NOTE 08/13/2014  Patient:  SARANNE, CRISLIP   Account Number:  [id number]  Date Initiated:  08/13/2014  Documentation initiated by:  [facility]  Subjective/Objective Assessment:   adm: LEFT TOTAL HIP ARTHROPLASTY ANTERIOR APPROACH (Left)     Action/Plan:   discharge planning   Anticipated DC Date:  08/13/2014   Anticipated DC Plan:  [place]  CM consult      [facility] Choice  HOME HEALTH   Choice offered to / List presented to:  C-1 Patient   DME arranged  NA      DME agency  NA     [place] arranged  HH-2 PT      [place]   Status of service:  Completed, signed off Medicare Important Message given?   (If response is "NO", the following Medicare IM given date fields will be blank) Date Medicare IM given:   Medicare IM given by:   Date Additional Medicare IM given:   Additional Medicare IM given by:    Discharge Disposition:  [place]  Per UR Regulation:    If discussed at Long Length of Stay Meetings, dates discussed:    Comments:  08/13/14 11:00 CM met with pt in room to confirm choice of home health agency to be [place].  Pt confirms [facility] as her choice and is requesting [facility] as her PT.  Address and contact information verified with pt.  Pt states she has 3n1 and rooling walker from previous surgery.  Referral called to [facility] with request of [facility] for PT. No other CM needs were communicated.  Mariane Masters, BSN, CM [phone].

## 2014-08-13 NOTE — Evaluation (Signed)
Physical Therapy Evaluation Patient Details Name: [name] MRN: [id number] DOB: Jun 10, 1934 Today's Date: 08/13/2014   History of Present Illness  L THR  Clinical Impression  Pt s.p L THR presents with decreased L LE strength/ROM and post op pain limiting functional mobility.  Pt should progress to d/c home with family assist and HHPT follow up.    Follow Up Recommendations Home health PT    Equipment Recommendations  None recommended by PT    Recommendations for Other Services OT consult     Precautions / Restrictions Precautions Precautions: Fall Restrictions Weight Bearing Restrictions: No Other Position/Activity Restrictions: WBAT      Mobility  Bed Mobility Overal bed mobility: Needs Assistance Bed Mobility: Supine to Sit     Supine to sit: Min assist;Mod assist     General bed mobility comments: cues for sequence and use of R LE to self assist  Transfers Overall transfer level: Needs assistance Equipment used: Rolling walker (2 wheeled) Transfers: Sit to/from Stand Sit to Stand: Min assist;Mod assist         General transfer comment: cues for LE management and use of UEs to self assist  Ambulation/Gait Ambulation/Gait assistance: Min assist Ambulation Distance (Feet): 48 Feet Assistive device: Rolling walker (2 wheeled) Gait Pattern/deviations: Step-to pattern;Decreased step length - right;Decreased step length - left;Shuffle;Trunk flexed Gait velocity: decr   General Gait Details: cues for posture, position from RW and initial sequence  Stairs            Wheelchair Mobility    Modified Rankin (Stroke Patients Only)       Balance                                             Pertinent Vitals/Pain Pain Assessment: 0-10 Pain Score: 5  Pain Location: L hip Pain Descriptors / Indicators: Aching;Sore Pain Intervention(s): Limited activity within patient's tolerance;Monitored during session;Premedicated before  session;Ice applied    Home Living Family/patient expects to be discharged to:: Private residence Living Arrangements: Spouse/significant other Available Help at Discharge: Family Type of Home: House Home Access: Stairs to enter Entrance Stairs-Rails: Doctor, general practiceight;Left Entrance Stairs-Number of Steps: 4 Home Layout: One level Home Equipment: Walker - 2 wheels      Prior Function Level of Independence: Independent;Independent with assistive device(s)               Hand Dominance   Dominant Hand: Right    Extremity/Trunk Assessment   Upper Extremity Assessment: Overall WFL for tasks assessed           Lower Extremity Assessment: LLE deficits/detail   LLE Deficits / Details: 2+/5 hip strength with AAROM at hip to 80 flex and 15 abd  Cervical / Trunk Assessment: Normal  Communication   Communication: No difficulties;HOH  Cognition Arousal/Alertness: Awake/alert Behavior During Therapy: WFL for tasks assessed/performed Overall Cognitive Status: Within Functional Limits for tasks assessed                      General Comments      Exercises Total Joint Exercises Ankle Circles/Pumps: AROM;Both;15 reps;Supine Quad Sets: AROM;Both;10 reps;Supine Heel Slides: AAROM;Left;15 reps;Supine Hip ABduction/ADduction: AAROM;Left;10 reps;Supine      Assessment/Plan    PT Assessment Patient needs continued PT services  PT Diagnosis Difficulty walking   PT Problem List Decreased strength;Decreased range of motion;Decreased activity tolerance;Decreased mobility;Decreased  knowledge of use of DME;Pain  PT Treatment Interventions DME instruction;Gait training;Stair training;Functional mobility training;Therapeutic activities;Therapeutic exercise;Patient/family education   PT Goals (Current goals can be found in the Care Plan section) Acute Rehab PT Goals Patient Stated Goal: Resume previous lifestyle with decreased pain PT Goal Formulation: With patient Time For Goal  Achievement: 08/20/14 Potential to Achieve Goals: Good    Frequency 7X/week   Barriers to discharge        Co-evaluation               End of Session Equipment Utilized During Treatment: Gait belt Activity Tolerance: Patient tolerated treatment well Patient left: in chair;with call bell/phone within reach Nurse Communication: Mobility status         Time: [id number] PT Time Calculation (min): 38 min   Charges:   PT Evaluation $Initial PT Evaluation Tier I: 1 Procedure PT Treatments $Gait Training: 8-22 mins $Therapeutic Exercise: 8-22 mins   PT G Codes:          Bekim Werntz 08/13/2014, 12:38 PM

## 2014-08-13 NOTE — Progress Notes (Signed)
Physical Therapy Treatment Patient Details Name: Regis BillDarlene J Albritton MRN: [id number] DOB: 19-Feb-1934 Today's Date: 08/13/2014    History of Present Illness L THR    PT Comments      Follow Up Recommendations  Home health PT     Equipment Recommendations  None recommended by PT    Recommendations for Other Services OT consult     Precautions / Restrictions Precautions Precautions: Fall Restrictions Weight Bearing Restrictions: No Other Position/Activity Restrictions: WBAT    Mobility  Bed Mobility Overal bed mobility: Needs Assistance Bed Mobility: Sit to Supine       Sit to supine: Min assist   General bed mobility comments: cues for sequence and use of R LE to self assist  Transfers Overall transfer level: Needs assistance Equipment used: Rolling walker (2 wheeled) Transfers: Sit to/from Stand Sit to Stand: Min assist Stand pivot transfers: Min assist       General transfer comment: verbal cues for hand placement.  Min A to steady   Ambulation/Gait Ambulation/Gait assistance: Min assist;Min guard Ambulation Distance (Feet): 114 Feet Assistive device: Rolling walker (2 wheeled) Gait Pattern/deviations: Step-to pattern;Step-through pattern;Decreased step length - right;Decreased step length - left;Shuffle;Trunk flexed Gait velocity: decr   General Gait Details: cues for posture, position from RW and initial sequence   Stairs            Wheelchair Mobility    Modified Rankin (Stroke Patients Only)       Balance                                    Cognition Arousal/Alertness: Awake/alert Behavior During Therapy: WFL for tasks assessed/performed Overall Cognitive Status: Within Functional Limits for tasks assessed                      Exercises      General Comments        Pertinent Vitals/Pain Pain Assessment: 0-10 Pain Score: 5  Pain Location: L hip Pain Descriptors / Indicators: Aching Pain  Intervention(s): Limited activity within patient's tolerance;Monitored during session;Premedicated before session;Ice applied    Home Living                      Prior Function            PT Goals (current goals can now be found in the care plan section) Acute Rehab PT Goals Patient Stated Goal: Resume previous lifestyle with decreased pain PT Goal Formulation: With patient Time For Goal Achievement: 08/20/14 Potential to Achieve Goals: Good Progress towards PT goals: Progressing toward goals    Frequency  7X/week    PT Plan Current plan remains appropriate    Co-evaluation             End of Session Equipment Utilized During Treatment: Gait belt Activity Tolerance: Patient tolerated treatment well Patient left: in bed;with call bell/phone within reach     Time: [id number] PT Time Calculation (min): 18 min  Charges:  $Gait Training: 8-22 mins                    G Codes:      Darice Vicario 08/13/2014, 5:04 PM

## 2014-08-13 NOTE — Discharge Instructions (Addendum)
°Dr. Frank Aluisio °Total Joint Specialist °[facility] °[address] °[place], [place] [address] °[phone] ° ° ° °ANTERIOR APPROACH TOTAL HIP REPLACEMENT POSTOPERATIVE DIRECTIONS ° ° °Hip Rehabilitation, Guidelines Following Surgery  °The results of a hip operation are greatly improved after range of motion and muscle strengthening exercises. Follow all safety measures which are given to protect your hip. If any of these exercises cause increased pain or swelling in your joint, decrease the amount until you are comfortable again. Then slowly increase the exercises. Call your caregiver if you have problems or questions.  °HOME CARE INSTRUCTIONS  °Most of the following instructions are designed to prevent the dislocation of your new hip.  °Remove items at home which could result in a fall. This includes throw rugs or furniture in walking pathways.  °Continue medications as instructed at time of discharge. °· You may have some home medications which will be placed on hold until you complete the course of blood thinner medication. °· You may start showering once you are discharged home but do not submerge the incision under water. Just pat the incision dry and apply a dry gauze dressing on daily. °Do not put on socks or shoes without following the instructions of your caregivers.  °Sit on high chairs which makes it easier to stand.  °Sit on chairs with arms. Use the chair arms to help push yourself up when arising.  °Keep your leg on the side of the operation out in front of you when standing up.  °Arrange for the use of a toilet seat elevator so you are not sitting low.   °· Walk with walker as instructed.  °You may resume a sexual relationship in one month or when given the OK by your caregiver.  °Use walker as long as suggested by your caregivers.  °You may put full weight on your legs and walk as much as is comfortable. °Avoid periods of inactivity such as sitting longer than an hour  when not asleep. This helps prevent blood clots.  °You may return to work once you are cleared by your surgeon.  °Do not drive a car for 6 weeks or until released by your surgeon.  °Do not drive while taking narcotics.  °Wear elastic stockings for three weeks following surgery during the day but you may remove then at night.  °Make sure you keep all of your appointments after your operation with all of your doctors and caregivers. You should call the office at the above phone number and make an appointment for approximately two weeks after the date of your surgery. °Change the dressing daily and reapply a dry dressing each time. °Please pick up a stool softener and laxative for home use as long as you are requiring pain medications. °· Continue to use ice on the hip for pain and swelling from surgery. You may notice swelling that will progress down to the foot and ankle.  This is normal after  surgery.  Elevate the leg when you are not up walking on it.   °It is important for you to complete the blood thinner medication as prescribed by your doctor. °· Continue to use the breathing machine which will help keep your temperature down.  It is common for your temperature to cycle up and down following surgery, especially at night when you are not up moving around and exerting yourself.  The breathing machine keeps your lungs expanded and your temperature down. ° °RANGE OF MOTION AND STRENGTHENING EXERCISES  °  These exercises are designed to help you keep full movement of your hip joint. Follow your caregiver's or physical therapist's instructions. Perform all exercises about fifteen times, three times per day or as directed. Exercise both hips, even if you have had only one joint replacement. These exercises can be done on a training (exercise) mat, on the floor, on a table or on a bed. Use whatever works the best and is most comfortable for you. Use music or television while you are exercising so that the exercises are  a pleasant break in your day. This will make your life better with the exercises acting as a break in routine you can look forward to.  °Lying on your back, slowly slide your foot toward your buttocks, raising your knee up off the floor. Then slowly slide your foot back down until your leg is straight again.  °Lying on your back spread your legs as far apart as you can without causing discomfort.  °Lying on your side, raise your upper leg and foot straight up from the floor as far as is comfortable. Slowly lower the leg and repeat.  °Lying on your back, tighten up the muscle in the front of your thigh (quadriceps muscles). You can do this by keeping your leg straight and trying to raise your heel off the floor. This helps strengthen the largest muscle supporting your knee.  °Lying on your back, tighten up the muscles of your buttocks both with the legs straight and with the knee bent at a comfortable angle while keeping your heel on the floor.  ° °SKILLED REHAB INSTRUCTIONS: °If the patient is transferred to a skilled rehab facility following release from the hospital, a list of the current medications will be sent to the facility for the patient to continue.  When discharged from the skilled rehab facility, please have the facility set up the patient's Home Health Physical Therapy prior to being released. Also, the skilled facility will be responsible for providing the patient with their medications at time of release from the facility to include their pain medication, the muscle relaxants, and their blood thinner medication. If the patient is still at the rehab facility at time of the two week follow up appointment, the skilled rehab facility will also need to assist the patient in arranging follow up appointment in our office and any transportation needs. ° °MAKE SURE YOU:  °Understand these instructions.  °Will watch your condition.  °Will get help right away if you are not doing well or get worse. ° °Pick up  stool softner and laxative for home. °Do not submerge incision under water. °May shower. °Continue to use ice for pain and swelling from surgery. °Total Hip Protocol. ° °Take Xarelto for two and a half more weeks, then discontinue Xarelto. °Once the patient has completed the Xarelto, they may resume the 325 mg Aspirin. ° ° °Information on my medicine - XARELTO® (Rivaroxaban) ° °This medication education was reviewed with me or my healthcare representative as part of my discharge preparation.  The pharmacist that spoke with me during my hospital stay was:  Jackson, Rachel E, RPH ° °Why was Xarelto® prescribed for you? °Xarelto® was prescribed for you to reduce the risk of blood clots forming after orthopedic surgery. The medical term for these abnormal blood clots is venous thromboembolism (VTE). ° °What do you need to know about xarelto® ? °Take your Xarelto® ONCE DAILY at the same time every day. °You may take it either with or without   food. ° °If you have difficulty swallowing the tablet whole, you may crush it and mix in applesauce just prior to taking your dose. ° °Take Xarelto® exactly as prescribed by your doctor and DO NOT stop taking Xarelto® without talking to the doctor who prescribed the medication.  Stopping without other VTE prevention medication to take the place of Xarelto® may increase your risk of developing a clot. ° °After discharge, you should have regular check-up appointments with your healthcare provider that is prescribing your Xarelto®.   ° °What do you do if you miss a dose? °If you miss a dose, take it as soon as you remember on the same day then continue your regularly scheduled once daily regimen the next day. Do not take two doses of Xarelto® on the same day.  ° °Important Safety Information °A possible side effect of Xarelto® is bleeding. You should call your healthcare provider right away if you experience any of the following: °  Bleeding from an injury or your nose that does not  stop. °  Unusual colored urine (red or dark brown) or unusual colored stools (red or black). °  Unusual bruising for unknown reasons. °  A serious fall or if you hit your head (even if there is no bleeding). ° °Some medicines may interact with Xarelto® and might increase your risk of bleeding while on Xarelto®. To help avoid this, consult your healthcare provider or pharmacist prior to using any new prescription or non-prescription medications, including herbals, vitamins, non-steroidal anti-inflammatory drugs (NSAIDs) and supplements. ° °This website has more information on Xarelto®: www.xarelto.com. ° ° °

## 2014-08-14 LAB — CBC
HEMATOCRIT: 38.6 % (ref 36.0–46.0)
HEMOGLOBIN: 12.4 g/dL (ref 12.0–15.0)
MCH: 28.7 pg (ref 26.0–34.0)
MCHC: 32.1 g/dL (ref 30.0–36.0)
MCV: 89.4 fL (ref 78.0–100.0)
PLATELETS: 255 10*3/uL (ref 150–400)
RBC: 4.32 MIL/uL (ref 3.87–5.11)
RDW: 12.9 % (ref 11.5–15.5)
WBC: 15.6 10*3/uL — AB (ref 4.0–10.5)

## 2014-08-14 LAB — BASIC METABOLIC PANEL
ANION GAP: 14 (ref 5–15)
BUN: 16 mg/dL (ref 6–23)
CHLORIDE: 98 meq/L (ref 96–112)
CO2: 25 meq/L (ref 19–32)
Calcium: 9.1 mg/dL (ref 8.4–10.5)
Creatinine, Ser: 0.75 mg/dL (ref 0.50–1.10)
GFR calc Af Amer: >90 mL/min (ref >90)
GFR calc non Af Amer: 79 mL/min — ABNORMAL LOW (ref >90)
Glucose, Bld: 117 mg/dL — ABNORMAL HIGH (ref 70–99)
Potassium: 4.2 mEq/L (ref 3.7–5.3)
Sodium: 137 mEq/L (ref 137–147)

## 2014-08-14 NOTE — Progress Notes (Signed)
Physical Therapy Treatment Patient Details Name: [name] MRN: [id number] DOB: [birth year] Today's Date: 08/14/2014    History of Present Illness L THR    PT Comments    Pt eager for d.c home but moving slowly this am.  Reviewed stairs and car transfers with pt and spouse.  Follow Up Recommendations  Home health PT     Equipment Recommendations  None recommended by PT    Recommendations for Other Services OT consult     Precautions / Restrictions Precautions Precautions: Fall Restrictions Weight Bearing Restrictions: No Other Position/Activity Restrictions: WBAT    Mobility  Bed Mobility               General bed mobility comments: Pt declined - states she would sleep in recliner  Transfers Overall transfer level: Needs assistance Equipment used: Rolling walker (2 wheeled) Transfers: Sit to/from Stand Sit to Stand: Min assist         General transfer comment: verbal cues for hand placement.  Min A to steady   Ambulation/Gait Ambulation/Gait assistance: Min guard Ambulation Distance (Feet): 58 Feet Assistive device: Rolling walker (2 wheeled) Gait Pattern/deviations: Step-to pattern;Decreased step length - right;Decreased step length - left;Shuffle;Trunk flexed Gait velocity: decr   General Gait Details: cues for posture, position from RW and initial sequence   Stairs Stairs: Yes Stairs assistance: Min assist Stair Management: No rails;One rail Right;Step to pattern;Backwards;Forwards;With walker;With crutches Number of Stairs: 3 General stair comments: 2 steps with crutch and rail fwd and one step bkwd with RW   Wheelchair Mobility    Modified Rankin (Stroke Patients Only)       Balance                                    Cognition Arousal/Alertness: Awake/alert Behavior During Therapy: WFL for tasks assessed/performed Overall Cognitive Status: Within Functional Limits for tasks assessed                       Exercises      General Comments        Pertinent Vitals/Pain Pain Assessment: 0-10 Pain Score: 6  Pain Location: L hip Pain Descriptors / Indicators: Aching;Sore Pain Intervention(s): Limited activity within patient's tolerance;Monitored during session;Premedicated before session;Ice applied    Home Living                      Prior Function            PT Goals (current goals can now be found in the care plan section) Acute Rehab PT Goals Patient Stated Goal: Resume previous lifestyle with decreased pain PT Goal Formulation: With patient Time For Goal Achievement: 08/20/14 Potential to Achieve Goals: Good Progress towards PT goals: Progressing toward goals    Frequency  7X/week    PT Plan Current plan remains appropriate    Co-evaluation             End of Session Equipment Utilized During Treatment: Gait belt Activity Tolerance: Patient tolerated treatment well Patient left: Other (comment) (sitting EOB)     Time: [id number] PT Time Calculation (min): 23 min  Charges:  $Gait Training: 8-22 mins $Therapeutic Activity: 8-22 mins                    G Codes:      Ozan Maclay 08/14/2014, 12:52 PM

## 2014-08-14 NOTE — Progress Notes (Signed)
Discharge summary sent to payer through [facility]  

## 2014-08-14 NOTE — Progress Notes (Signed)
   Subjective: 2 Days Post-Op Procedure(s) (LRB): LEFT TOTAL HIP ARTHROPLASTY ANTERIOR APPROACH (Left) Patient reports pain as mild.   Patient seen in rounds for Dr. Lequita HaltAluisio. Patient is well, and has had no acute complaints or problems Patient is ready to go home today.  Objective: Vital signs in last 24 hours: Temp:  [97.6 F (36.4 C)-98.6 F (37 C)] 98.6 F (37 C) (10/23 0654) Pulse Rate:  [79-82] 79 (10/23 0654) Resp:  [14-16] 16 (10/23 0654) BP: (134-148)/(50-72) 148/72 mmHg (10/23 0654) SpO2:  [97 %-99 %] 98 % (10/23 0654)  Intake/Output from previous day:  Intake/Output Summary (Last 24 hours) at 08/14/14 0803 Last data filed at 08/14/14 0655  Gross per 24 hour  Intake 1417.5 ml  Output   1125 ml  Net  292.5 ml    Labs:  Recent Labs  08/13/14 0447 08/14/14 0440  HGB 12.3 12.4    Recent Labs  08/13/14 0447 08/14/14 0440  WBC 15.1* 15.6*  RBC 4.30 4.32  HCT 37.6 38.6  PLT 314 255    Recent Labs  08/13/14 0447 08/14/14 0440  NA 135* 137  K 4.8 4.2  CL 99 98  CO2 25 25  BUN 18 16  CREATININE 0.77 0.75  GLUCOSE 138* 117*  CALCIUM 9.1 9.1   No results found for this basename: LABPT, INR,  in the last 72 hours  EXAM: General - Patient is Alert, Appropriate and Oriented Extremity - Neurovascular intact Sensation intact distally Incision - clean, dry, no drainage Motor Function - intact, moving foot and toes well on exam.   Assessment/Plan: 2 Days Post-Op Procedure(s) (LRB): LEFT TOTAL HIP ARTHROPLASTY ANTERIOR APPROACH (Left) Procedure(s) (LRB): LEFT TOTAL HIP ARTHROPLASTY ANTERIOR APPROACH (Left) Past Medical History  Diagnosis Date  . PONV (postoperative nausea and vomiting)     severe  . Peptic ulcer disease with hemorrhage 10/13/2008    admitted to hospital .had 4 blood transfusions  . Hypertension   . Neuromuscular disorder     left knee  . Chronic neck pain   . History of blood transfusion 2009  . H/O hiatal hernia   .  Headache(784.0)     hx of "when going thru menopause" (08/15/2012)  . Arthritis     "shoulders, knees, right thumb" (08/15/2012)  . Blood clot in vein 15 yrs ago    pt not sure which leg  . Pneumonia ~ 1989  . Hypothyroidism     not on meds at present. MD watching  . GERD (gastroesophageal reflux disease)     hx of- NOT MUCH OF PROBLEM NOW AND NO MEDS   Principal Problem:   OA (osteoarthritis) of hip  Estimated body mass index is 26.25 kg/(m^2) as calculated from the following:   Height as of this encounter: 5\' 4"  (1.626 m).   Weight as of this encounter: 69.4 kg (153 lb). Up with therapy Discharge home with home health Diet - Cardiac diet Follow up - in 2 weeks Activity - WBAT Disposition - Home Condition Upon Discharge - Good D/C Meds - See DC Summary DVT Prophylaxis - Xarelto  Avel Peacerew Tauri Ethington, PA-C Orthopaedic Surgery 08/14/2014, 8:03 AM

## 2014-10-06 DIAGNOSIS — E2740 Unspecified adrenocortical insufficiency: Secondary | ICD-10-CM | POA: Insufficient documentation

## 2014-12-25 HISTORY — PX: CATARACT EXTRACTION W/ INTRAOCULAR LENS  IMPLANT, BILATERAL: SHX1307

## 2015-01-12 LAB — CBC AND DIFFERENTIAL
HEMATOCRIT: 46 % (ref 36–46)
Hemoglobin: 15 g/dL (ref 12.0–16.0)
Platelets: 325 10*3/uL (ref 150–399)
WBC: 5.2 10*3/mL

## 2015-01-12 LAB — BASIC METABOLIC PANEL
BUN: 21 mg/dL (ref 4–21)
Creatinine: 0.9 mg/dL (ref 0.5–1.1)
GLUCOSE: 92 mg/dL
POTASSIUM: 5 mmol/L (ref 3.4–5.3)
Sodium: 138 mmol/L (ref 137–147)

## 2015-01-12 LAB — HEPATIC FUNCTION PANEL
ALT: 20 U/L (ref 7–35)
AST: 22 U/L (ref 13–35)

## 2015-01-12 LAB — LIPID PANEL
CHOLESTEROL: 193 mg/dL (ref 0–200)
HDL: 67 mg/dL (ref 35–70)
LDL CALC: 78 mg/dL
Triglycerides: 239 mg/dL — AB (ref 40–160)

## 2015-01-12 LAB — TSH: TSH: 5.51 u[IU]/mL (ref 0.41–5.90)

## 2015-02-18 ENCOUNTER — Ambulatory Visit
Admit: 2015-02-18 | Disposition: A | Discharge status: Home / Self Care | Payer: Self-pay | Attending: Family Medicine | Admitting: Family Medicine

## 2015-02-19 DIAGNOSIS — E559 Vitamin D deficiency, unspecified: Secondary | ICD-10-CM | POA: Insufficient documentation

## 2015-02-19 DIAGNOSIS — R6889 Other general symptoms and signs: Secondary | ICD-10-CM | POA: Insufficient documentation

## 2015-02-19 DIAGNOSIS — M810 Age-related osteoporosis without current pathological fracture: Secondary | ICD-10-CM | POA: Insufficient documentation

## 2015-02-19 DIAGNOSIS — Z6828 Body mass index (BMI) 28.0-28.9, adult: Secondary | ICD-10-CM | POA: Insufficient documentation

## 2015-02-19 DIAGNOSIS — K5730 Diverticulosis of large intestine without perforation or abscess without bleeding: Secondary | ICD-10-CM | POA: Insufficient documentation

## 2015-02-19 DIAGNOSIS — K259 Gastric ulcer, unspecified as acute or chronic, without hemorrhage or perforation: Secondary | ICD-10-CM | POA: Insufficient documentation

## 2015-02-19 DIAGNOSIS — F4320 Adjustment disorder, unspecified: Secondary | ICD-10-CM | POA: Insufficient documentation

## 2015-02-19 DIAGNOSIS — I10 Essential (primary) hypertension: Secondary | ICD-10-CM | POA: Insufficient documentation

## 2015-02-19 DIAGNOSIS — E875 Hyperkalemia: Secondary | ICD-10-CM | POA: Insufficient documentation

## 2015-02-19 DIAGNOSIS — M1990 Unspecified osteoarthritis, unspecified site: Secondary | ICD-10-CM | POA: Insufficient documentation

## 2015-02-19 DIAGNOSIS — R0689 Other abnormalities of breathing: Secondary | ICD-10-CM | POA: Insufficient documentation

## 2015-02-19 DIAGNOSIS — E781 Pure hyperglyceridemia: Secondary | ICD-10-CM | POA: Insufficient documentation

## 2015-04-20 ENCOUNTER — Ambulatory Visit (INDEPENDENT_AMBULATORY_CARE_PROVIDER_SITE_OTHER): Payer: Medicare Other | Admitting: Family Medicine

## 2015-04-20 ENCOUNTER — Encounter: Payer: Self-pay | Admitting: Family Medicine

## 2015-04-20 VITALS — BP 128/78 | HR 72 | Temp 97.9°F | Resp 16 | Ht 63.5 in | Wt 153.0 lb

## 2015-04-20 DIAGNOSIS — E875 Hyperkalemia: Secondary | ICD-10-CM | POA: Diagnosis not present

## 2015-04-20 DIAGNOSIS — E559 Vitamin D deficiency, unspecified: Secondary | ICD-10-CM | POA: Diagnosis not present

## 2015-04-20 DIAGNOSIS — E038 Other specified hypothyroidism: Secondary | ICD-10-CM | POA: Insufficient documentation

## 2015-04-20 DIAGNOSIS — Z0000 Encounter for general adult medical examination without abnormal findings: Secondary | ICD-10-CM

## 2015-04-20 DIAGNOSIS — E039 Hypothyroidism, unspecified: Secondary | ICD-10-CM | POA: Insufficient documentation

## 2015-04-20 NOTE — Progress Notes (Signed)
Patient ID: [name], female   DOB: 01-17-1934, 81 y.o.   MRN: [id number]         Patient: [name], Female    DOB: 12-21-1933, 81 y.o.   MRN: [id number] Visit Date: 04/20/2015  Today's Provider: Lorie Phenix, MD   Chief Complaint  Patient presents with  . Annual Exam   Subjective:    Annual wellness visit [name] is a 81 y.o. female who presents today for her Subsequent Annual Wellness Visit. She feels fairly well,  Pt reports "bumping" her left great toe, which formed a hematoma around April.  When she had her last pedicure they were able to drain it some.  Pt is concern that her toe nail isn't growing.   She reports going to the gym three times a week. She reports she is sleeping fairly well.  -----------------------------------------------------------   Review of Systems  Constitutional: Positive for fatigue (Pt reports every declined especially after having 3 joint replacements in two years. ). Negative for fever, chills, diaphoresis, activity change, appetite change and unexpected weight change.  HENT: Negative.   Eyes: Negative.   Respiratory: Negative.   Cardiovascular: Negative.   Gastrointestinal: Negative.   Genitourinary: Negative.   Musculoskeletal: Positive for myalgias, arthralgias and gait problem. Negative for back pain, joint swelling, neck pain and neck stiffness.       Pt has a history of Arthritis, with multiple joint surgeries/replacements.    Skin: Negative.   Allergic/Immunologic: Negative.   Neurological: Negative.   Hematological: Negative.   Psychiatric/Behavioral: Negative.     History   Social History  . Marital Status: Married    Spouse Name: N/A  . Number of Children: N/A  . Years of Education: N/A   Occupational History  . Not on file.   Social History Main Topics  . Smoking status: Never Smoker   . Smokeless tobacco: Never Used  . Alcohol Use: No  . Drug Use: No  . Sexual Activity: Not  Currently   Other Topics Concern  . Not on file   Social History Narrative    Patient Active Problem List   Diagnosis Date Noted  . Subclinical hypothyroidism 04/20/2015  . Adjustment reaction to medical therapy 02/19/2015  . Arthritis 02/19/2015  . Body mass index 27.0-27.9, adult 02/19/2015  . Alveolar aeration decreased 02/19/2015  . Colon, diverticulosis 02/19/2015  . Gastric ulcer 02/19/2015  . High potassium 02/19/2015  . BP (high blood pressure) 02/19/2015  . Hypertriglyceridemia 02/19/2015  . Arthritis, degenerative 02/19/2015  . OP (osteoporosis) 02/19/2015  . Vitamin D deficiency 02/19/2015  . Aldosterone deficiency 10/06/2014  . OA (osteoarthritis) of hip 08/12/2014  . Hyponatremia 11/06/2013  . Postoperative anemia due to acute blood loss 11/06/2013  . OA (osteoarthritis) of knee 10/31/2013    Past Surgical History  Procedure Laterality Date  . Tonsillectomy and adenoidectomy  ~ 1945  . Nasal polyp excision  ~ 1946  . Shoulder surgery  ~ 2003    "bone spur removed; left side"  . Reverse shoulder arthroplasty  08/15/2012    Procedure: REVERSE SHOULDER ARTHROPLASTY;  Surgeon: Senaida Lange, MD;  Location: [facility];  Service: Orthopedics;  Laterality: Left;  . Total knee arthroplasty Right 10/31/2013    Procedure: RIGHT TOTAL KNEE ARTHROPLASTY;  Surgeon: Loanne Drilling, MD;  Location: [facility];  Service: Orthopedics;  Laterality: Right;  . Injection knee Left 10/31/2013    Procedure: CORTIZONE INJECTION LEFT KNEE ;  Surgeon: Loanne Drilling,  MD;  Location: [facility];  Service: Orthopedics;  Laterality: Left;  injected at 1624 under anesthesia  . Total hip arthroplasty Left 08/12/2014    Procedure: LEFT TOTAL HIP ARTHROPLASTY ANTERIOR APPROACH;  Surgeon: Loanne DrillingFrank Aluisio V, MD;  Location: [facility];  Service: Orthopedics;  Laterality: Left;  . Cataract extraction Bilateral     Her family history includes Alcohol abuse in her father; Arthritis in her mother; CAD in her brother;  Diabetes in her son; Hypertension in her brother and mother; Prostate cancer in her brother; Stroke in her brother; Transient ischemic attack in her mother.    Previous Medications   ASPIRIN EC 81 MG TABLET    Take by mouth.   FLUDROCORTISONE (FLORINEF) 0.1 MG TABLET    Take by mouth.   FOLIC ACID 20 MG CAPS    Take by mouth.   HYDROCHLOROTHIAZIDE (MICROZIDE) 12.5 MG CAPSULE    Take 25 mg by mouth every morning.   LATANOPROST (XALATAN) 0.005 % OPHTHALMIC SOLUTION    Place 1 drop into both eyes at bedtime.   METOPROLOL TARTRATE (LOPRESSOR) 25 MG TABLET    Take 25 mg by mouth 2 (two) times daily.   MULTIPLE VITAMIN (MULTIVITAMIN) CAPSULE    Take by mouth.   VITAMIN A ([employer]S VITAMIN A) 10000 UNIT CAPSULE    Take by mouth.    Patient Care Team: Lorie PhenixNancy Sherlie Boyum, MD as PCP - General (Family Medicine)     Objective:   Vitals: BP 128/78 mmHg  Pulse 72  Temp(Src) 97.9 F (36.6 C) (Oral)  Resp 16  Ht 5' 3.5" (1.613 m)  Wt 153 lb (69.4 kg)  BMI 26.67 kg/m2  Physical Exam  Constitutional: She is oriented to person, place, and time. She appears well-developed and well-nourished.  HENT:  Head: Normocephalic and atraumatic.  Right Ear: Hearing, tympanic membrane, external ear and ear canal normal.  Left Ear: Hearing, tympanic membrane, external ear and ear canal normal.  Nose: Nose normal.  Mouth/Throat: Uvula is midline, oropharynx is clear and moist and mucous membranes are normal.  Eyes: Conjunctivae, EOM and lids are normal. Pupils are equal, round, and reactive to light. Lids are everted and swept, no foreign bodies found.  Neck: Trachea normal and normal range of motion. Carotid bruit is not present.  Cardiovascular: Normal rate, regular rhythm, normal heart sounds and normal pulses.   Pulmonary/Chest: Effort normal and breath sounds normal.  Abdominal: Soft. Normal appearance and normal aorta.  Musculoskeletal: Normal range of motion.  Neurological: She is alert and oriented to person,  place, and time.  Skin: Skin is warm, dry and intact.  Psychiatric: She has a normal mood and affect. Judgment normal.    Activities of Daily Living In your present state of health, do you have any difficulty performing the following activities: 04/20/2015 08/12/2014  Hearing? N N  Vision? N N  Difficulty concentrating or making decisions? N N  Walking or climbing stairs? Y Y  Dressing or bathing? N N  Doing errands, shopping? N N    Fall Risk Assessment Fall Risk  04/20/2015  Falls in the past year? No     Depression Screen PHQ 2/9 Scores 04/20/2015  PHQ - 2 Score 0     Assessment & Plan:     Annual Wellness Visit  Reviewed patient's Family Medical History Reviewed and updated list of patient's medical providers Assessed patient's functional ability Established a written schedule for health screening services Health Risk Assessent Completed and Reviewed  Exercise Activities  and Dietary recommendations Goals    None      Immunization History  Administered Date(s) Administered  . Influenza Split 08/16/2012  . Pneumococcal Conjugate-13 01/16/2014    Health Maintenance  Topic Date Due  . TETANUS/TDAP  05/05/1955  . ZOSTAVAX  05/04/1996  . PNA vac Low Risk Adult (2 of 2 - PPSV23) 01/17/2015  . COLONOSCOPY  04/19/2016 (Originally 01/18/2013)  . INFLUENZA VACCINE  05/24/2015  . DEXA SCAN  Completed      Discussed health benefits of physical activity, and encouraged her to engage in regular exercise appropriate for her age and condition.    ------------------------------------------------------------------------------------------------------------  Patient was seen and examined by Leo Grosser, MD, and note scribed by Kavin Leech, CMA.   I have reviewed the document for accuracy and completeness and I agree with above. Leo Grosser, MD   Lorie Phenix, MD

## 2015-04-22 LAB — COMPREHENSIVE METABOLIC PANEL
A/G RATIO: 2 (ref 1.1–2.5)
ALT: 20 IU/L (ref 0–32)
AST: 25 IU/L (ref 0–40)
Albumin: 4.4 g/dL (ref 3.5–4.8)
Alkaline Phosphatase: 86 IU/L (ref 39–117)
BUN/Creatinine Ratio: 20 (ref 11–26)
BUN: 18 mg/dL (ref 8–27)
Bilirubin Total: 0.6 mg/dL (ref 0.0–1.2)
CO2: 27 mmol/L (ref 18–29)
CREATININE: 0.88 mg/dL (ref 0.57–1.00)
Calcium: 10.1 mg/dL (ref 8.7–10.3)
Chloride: 99 mmol/L (ref 97–108)
GFR calc Af Amer: 73 mL/min/{1.73_m2} (ref >59)
GFR calc non Af Amer: 63 mL/min/{1.73_m2} (ref >59)
Globulin, Total: 2.2 g/dL (ref 1.5–4.5)
Glucose: 91 mg/dL (ref 65–99)
POTASSIUM: 4.7 mmol/L (ref 3.5–5.2)
Sodium: 140 mmol/L (ref 134–144)
TOTAL PROTEIN: 6.6 g/dL (ref 6.0–8.5)

## 2015-04-22 LAB — VITAMIN D 25 HYDROXY (VIT D DEFICIENCY, FRACTURES): VIT D 25 HYDROXY: 33.9 ng/mL (ref 30.0–100.0)

## 2015-04-22 LAB — T4 AND TSH
T4, Total: 6.2 ug/dL (ref 4.5–12.0)
TSH: 4.11 u[IU]/mL (ref 0.450–4.500)

## 2015-05-15 ENCOUNTER — Other Ambulatory Visit: Payer: Self-pay | Admitting: Family Medicine

## 2015-05-15 DIAGNOSIS — I10 Essential (primary) hypertension: Secondary | ICD-10-CM

## 2015-05-31 ENCOUNTER — Other Ambulatory Visit: Payer: Self-pay | Admitting: Family Medicine

## 2015-05-31 DIAGNOSIS — M1990 Unspecified osteoarthritis, unspecified site: Secondary | ICD-10-CM

## 2015-05-31 MED ORDER — TRAMADOL HCL 50 MG PO TABS: 50.0000 mg | ORAL_TABLET | ORAL | Status: DC | PRN

## 2015-05-31 NOTE — Telephone Encounter (Signed)
Ok to call in rx.  Thanks.  

## 2015-05-31 NOTE — Telephone Encounter (Signed)
Pt contacted office for refill request on the following medications: Tramadol 50 mg to [facility] on [address]. Pt stated that she was offered a refill in June but didn't need it and now she is having some pain and would like a refill. Thanks TNP

## 2015-07-05 NOTE — Pre-Procedure Instructions (Signed)
    [name]  07/05/2015      [facility] [address], [place] - [address] [place] [address] Phone: [phone] Fax: [phone]    Your procedure is scheduled on Thursday, September 22nd, 2016.    Report to [facility] Admitting at 8:00 A.M .  Call this number if you have problems the morning of surgery:  [phone]   Remember:  Do not eat food or drink liquids after midnight.   Take these medicines the morning of surgery with A SIP OF WATER: Acetaminophen (Tylenol) if needed, Fludrocortisone (Florinef), Metoprolol Tartrate (Lopressor), Tramadol (Ultram) if needed.     Stop taking the following 7 days prior to surgery: Aspirin, NSAIDS, Niacin, Ibuprofen, Aleve, Naproxen, Advil, Motrin, Fish Oil, all herbal medications, and all vitamins.    Do not wear jewelry, make-up or nail polish.  Do not wear lotions, powders, or perfumes.  You may NOT wear deodorant.  Do not shave 48 hours prior to surgery.     Do not bring valuables to the hospital.  [facility] is not responsible for any belongings or valuables.  Contacts, dentures or bridgework may not be worn into surgery.  Leave your suitcase in the car.  After surgery it may be brought to your room.  For patients admitted to the hospital, discharge time will be determined by your treatment team.  Patients discharged the day of surgery will not be allowed to drive home.   Special instructions: See attached sheet "Preparing for Surgery" for instructions on CHG shower.  Please read over the following fact sheets that you were given. Pain Booklet, Coughing and Deep Breathing, Total Joint Packet, MRSA Information and Surgical Site Infection Prevention

## 2015-07-06 ENCOUNTER — Encounter (HOSPITAL_COMMUNITY)
Admission: RE | Admit: 2015-07-06 | Discharge: 2015-07-06 | Disposition: A | Discharge status: Home / Self Care | Payer: Medicare Other | Source: Ambulatory Visit | Attending: Orthopedic Surgery | Admitting: Orthopedic Surgery

## 2015-07-06 ENCOUNTER — Encounter (HOSPITAL_COMMUNITY): Payer: Self-pay

## 2015-07-06 DIAGNOSIS — M1990 Unspecified osteoarthritis, unspecified site: Secondary | ICD-10-CM | POA: Diagnosis not present

## 2015-07-06 DIAGNOSIS — Z01812 Encounter for preprocedural laboratory examination: Secondary | ICD-10-CM | POA: Insufficient documentation

## 2015-07-06 HISTORY — DX: Depression, unspecified: F32.A

## 2015-07-06 HISTORY — DX: Chronic kidney disease, unspecified: N18.9

## 2015-07-06 HISTORY — DX: Unspecified urinary incontinence: R32

## 2015-07-06 HISTORY — DX: Major depressive disorder, single episode, unspecified: F32.9

## 2015-07-06 HISTORY — DX: Anxiety disorder, unspecified: F41.9

## 2015-07-06 LAB — COMPREHENSIVE METABOLIC PANEL
ALBUMIN: 4 g/dL (ref 3.5–5.0)
ALK PHOS: 81 U/L (ref 38–126)
ALT: 17 U/L (ref 14–54)
ANION GAP: 7 (ref 5–15)
AST: 22 U/L (ref 15–41)
BUN: 19 mg/dL (ref 6–20)
CALCIUM: 9.7 mg/dL (ref 8.9–10.3)
CHLORIDE: 101 mmol/L (ref 101–111)
CO2: 30 mmol/L (ref 22–32)
Creatinine, Ser: 1.02 mg/dL — ABNORMAL HIGH (ref 0.44–1.00)
GFR calc non Af Amer: 51 mL/min — ABNORMAL LOW (ref >60)
GFR, EST AFRICAN AMERICAN: 59 mL/min — AB (ref >60)
GLUCOSE: 95 mg/dL (ref 65–99)
POTASSIUM: 4.7 mmol/L (ref 3.5–5.1)
SODIUM: 138 mmol/L (ref 135–145)
Total Bilirubin: 0.9 mg/dL (ref 0.3–1.2)
Total Protein: 7.1 g/dL (ref 6.5–8.1)

## 2015-07-06 LAB — APTT: aPTT: 33 seconds (ref 24–37)

## 2015-07-06 LAB — CBC WITH DIFFERENTIAL/PLATELET
BASOS PCT: 0 % (ref 0–1)
Basophils Absolute: 0 10*3/uL (ref 0.0–0.1)
EOS ABS: 0.3 10*3/uL (ref 0.0–0.7)
EOS PCT: 4 % (ref 0–5)
HCT: 45.1 % (ref 36.0–46.0)
HEMOGLOBIN: 14.7 g/dL (ref 12.0–15.0)
Lymphocytes Relative: 19 % (ref 12–46)
Lymphs Abs: 1.3 10*3/uL (ref 0.7–4.0)
MCH: 29.7 pg (ref 26.0–34.0)
MCHC: 32.6 g/dL (ref 30.0–36.0)
MCV: 91.1 fL (ref 78.0–100.0)
MONOS PCT: 14 % — AB (ref 3–12)
Monocytes Absolute: 0.9 10*3/uL (ref 0.1–1.0)
NEUTROS PCT: 63 % (ref 43–77)
Neutro Abs: 4.4 10*3/uL (ref 1.7–7.7)
PLATELETS: 274 10*3/uL (ref 150–400)
RBC: 4.95 MIL/uL (ref 3.87–5.11)
RDW: 13.5 % (ref 11.5–15.5)
WBC: 6.9 10*3/uL (ref 4.0–10.5)

## 2015-07-06 LAB — SURGICAL PCR SCREEN
MRSA, PCR: NEGATIVE
STAPHYLOCOCCUS AUREUS: POSITIVE — AB

## 2015-07-06 LAB — PROTIME-INR
INR: 1 (ref 0.00–1.49)
Prothrombin Time: 13.4 seconds (ref 11.6–15.2)

## 2015-07-06 NOTE — Progress Notes (Signed)
Pt. Notified of results of PCR. Already has mupirocin at home and will began using.

## 2015-07-15 ENCOUNTER — Inpatient Hospital Stay (HOSPITAL_COMMUNITY): Payer: Medicare Other | Admitting: Anesthesiology

## 2015-07-15 ENCOUNTER — Encounter (HOSPITAL_COMMUNITY)
Admission: RE | Disposition: A | Discharge status: Home / Self Care | Payer: Self-pay | Source: Ambulatory Visit | Attending: Orthopedic Surgery

## 2015-07-15 ENCOUNTER — Inpatient Hospital Stay (HOSPITAL_COMMUNITY)
Admission: RE | Admit: 2015-07-15 | Discharge: 2015-07-16 | DRG: 483 | Disposition: A | Discharge status: Home / Self Care | Payer: Medicare Other | Source: Ambulatory Visit | Attending: Orthopedic Surgery | Admitting: Orthopedic Surgery

## 2015-07-15 ENCOUNTER — Encounter (HOSPITAL_COMMUNITY): Payer: Self-pay | Admitting: *Deleted

## 2015-07-15 DIAGNOSIS — Z96651 Presence of right artificial knee joint: Secondary | ICD-10-CM | POA: Diagnosis present

## 2015-07-15 DIAGNOSIS — N189 Chronic kidney disease, unspecified: Secondary | ICD-10-CM | POA: Diagnosis present

## 2015-07-15 DIAGNOSIS — M19011 Primary osteoarthritis, right shoulder: Principal | ICD-10-CM | POA: Diagnosis present

## 2015-07-15 DIAGNOSIS — E039 Hypothyroidism, unspecified: Secondary | ICD-10-CM | POA: Diagnosis present

## 2015-07-15 DIAGNOSIS — Z91013 Allergy to seafood: Secondary | ICD-10-CM

## 2015-07-15 DIAGNOSIS — F329 Major depressive disorder, single episode, unspecified: Secondary | ICD-10-CM | POA: Diagnosis present

## 2015-07-15 DIAGNOSIS — Z811 Family history of alcohol abuse and dependence: Secondary | ICD-10-CM | POA: Diagnosis not present

## 2015-07-15 DIAGNOSIS — Z823 Family history of stroke: Secondary | ICD-10-CM | POA: Diagnosis not present

## 2015-07-15 DIAGNOSIS — Z96642 Presence of left artificial hip joint: Secondary | ICD-10-CM | POA: Diagnosis present

## 2015-07-15 DIAGNOSIS — Z833 Family history of diabetes mellitus: Secondary | ICD-10-CM | POA: Diagnosis not present

## 2015-07-15 DIAGNOSIS — I129 Hypertensive chronic kidney disease with stage 1 through stage 4 chronic kidney disease, or unspecified chronic kidney disease: Secondary | ICD-10-CM | POA: Diagnosis present

## 2015-07-15 DIAGNOSIS — Z96619 Presence of unspecified artificial shoulder joint: Secondary | ICD-10-CM

## 2015-07-15 DIAGNOSIS — F419 Anxiety disorder, unspecified: Secondary | ICD-10-CM | POA: Diagnosis present

## 2015-07-15 DIAGNOSIS — Z885 Allergy status to narcotic agent status: Secondary | ICD-10-CM | POA: Diagnosis not present

## 2015-07-15 DIAGNOSIS — Z8249 Family history of ischemic heart disease and other diseases of the circulatory system: Secondary | ICD-10-CM | POA: Diagnosis not present

## 2015-07-15 HISTORY — PX: TOTAL SHOULDER ARTHROPLASTY: SHX126

## 2015-07-15 SURGERY — ARTHROPLASTY, SHOULDER, TOTAL
Anesthesia: General | Site: Shoulder | Laterality: Right

## 2015-07-15 MED ORDER — EPHEDRINE SULFATE 50 MG/ML IJ SOLN
INTRAMUSCULAR | Status: DC | PRN
  Administered 2015-07-15: 10 mg via INTRAVENOUS

## 2015-07-15 MED ORDER — LACTATED RINGERS IV SOLN
INTRAVENOUS | Status: DC | PRN
  Administered 2015-07-15 (×2): via INTRAVENOUS

## 2015-07-15 MED ORDER — NEOSTIGMINE METHYLSULFATE 10 MG/10ML IV SOLN
INTRAVENOUS | Status: AC
  Filled 2015-07-15: qty 3

## 2015-07-15 MED ORDER — DIAZEPAM 5 MG PO TABS
2.5000 mg | ORAL_TABLET | Freq: Four times a day (QID) | ORAL | Status: DC | PRN
  Administered 2015-07-15: 2.5 mg via ORAL
  Filled 2015-07-15: qty 1

## 2015-07-15 MED ORDER — SODIUM CHLORIDE 0.9 % IJ SOLN
INTRAMUSCULAR | Status: AC
  Filled 2015-07-15: qty 10

## 2015-07-15 MED ORDER — HYDROMORPHONE HCL 1 MG/ML IJ SOLN: 0.2500 mg | INTRAMUSCULAR | Status: DC | PRN

## 2015-07-15 MED ORDER — POLYETHYLENE GLYCOL 3350 17 G PO PACK: 17.0000 g | PACK | Freq: Every day | ORAL | Status: DC | PRN

## 2015-07-15 MED ORDER — ROCURONIUM BROMIDE 50 MG/5ML IV SOLN
INTRAVENOUS | Status: AC
  Filled 2015-07-15: qty 1

## 2015-07-15 MED ORDER — LACTATED RINGERS IV SOLN
INTRAVENOUS | Status: DC
  Administered 2015-07-15: 10:00:00 via INTRAVENOUS

## 2015-07-15 MED ORDER — PHENOL 1.4 % MT LIQD: 1.0000 | OROMUCOSAL | Status: DC | PRN

## 2015-07-15 MED ORDER — HYDROMORPHONE HCL 1 MG/ML IJ SOLN
1.0000 mg | INTRAMUSCULAR | Status: DC | PRN
  Administered 2015-07-15: 1 mg via INTRAVENOUS
  Filled 2015-07-15: qty 1

## 2015-07-15 MED ORDER — EPHEDRINE SULFATE 50 MG/ML IJ SOLN
INTRAMUSCULAR | Status: AC
  Filled 2015-07-15: qty 1

## 2015-07-15 MED ORDER — LATANOPROST 0.005 % OP SOLN
1.0000 [drp] | Freq: Every day | OPHTHALMIC | Status: DC
  Filled 2015-07-15: qty 2.5

## 2015-07-15 MED ORDER — FENTANYL CITRATE (PF) 250 MCG/5ML IJ SOLN
INTRAMUSCULAR | Status: AC
  Filled 2015-07-15: qty 5

## 2015-07-15 MED ORDER — METOPROLOL TARTRATE 25 MG PO TABS
25.0000 mg | ORAL_TABLET | Freq: Two times a day (BID) | ORAL | Status: DC
  Administered 2015-07-15 – 2015-07-16 (×2): 25 mg via ORAL
  Filled 2015-07-15 (×2): qty 1

## 2015-07-15 MED ORDER — FLUDROCORTISONE ACETATE 0.1 MG PO TABS
0.0500 mg | ORAL_TABLET | Freq: Every day | ORAL | Status: DC
  Administered 2015-07-15 – 2015-07-16 (×2): 0.05 mg via ORAL
  Filled 2015-07-15 (×2): qty 0.5

## 2015-07-15 MED ORDER — FLEET ENEMA 7-19 GM/118ML RE ENEM: 1.0000 | ENEMA | Freq: Once | RECTAL | Status: DC | PRN

## 2015-07-15 MED ORDER — CHLORHEXIDINE GLUCONATE 4 % EX LIQD: 60.0000 mL | Freq: Once | CUTANEOUS | Status: DC

## 2015-07-15 MED ORDER — ONDANSETRON HCL 4 MG/2ML IJ SOLN
INTRAMUSCULAR | Status: AC
  Filled 2015-07-15: qty 2

## 2015-07-15 MED ORDER — KETOROLAC TROMETHAMINE 15 MG/ML IJ SOLN
7.5000 mg | Freq: Four times a day (QID) | INTRAMUSCULAR | Status: DC
  Administered 2015-07-15 – 2015-07-16 (×3): 7.5 mg via INTRAVENOUS
  Filled 2015-07-15 (×3): qty 1

## 2015-07-15 MED ORDER — CEFAZOLIN SODIUM-DEXTROSE 2-3 GM-% IV SOLR
2.0000 g | INTRAVENOUS | Status: AC
  Administered 2015-07-15: 2 g via INTRAVENOUS

## 2015-07-15 MED ORDER — METOCLOPRAMIDE HCL 5 MG/ML IJ SOLN: 5.0000 mg | Freq: Three times a day (TID) | INTRAMUSCULAR | Status: DC | PRN

## 2015-07-15 MED ORDER — PHENYLEPHRINE 40 MCG/ML (10ML) SYRINGE FOR IV PUSH (FOR BLOOD PRESSURE SUPPORT)
PREFILLED_SYRINGE | INTRAVENOUS | Status: AC
  Filled 2015-07-15: qty 10

## 2015-07-15 MED ORDER — LIDOCAINE HCL (CARDIAC) 20 MG/ML IV SOLN
INTRAVENOUS | Status: DC | PRN
  Administered 2015-07-15: 20 mg via INTRAVENOUS

## 2015-07-15 MED ORDER — MEPERIDINE HCL 25 MG/ML IJ SOLN: 6.2500 mg | INTRAMUSCULAR | Status: DC | PRN

## 2015-07-15 MED ORDER — OXYCODONE HCL 5 MG PO TABS
5.0000 mg | ORAL_TABLET | ORAL | Status: DC | PRN
  Administered 2015-07-16: 5 mg via ORAL
  Administered 2015-07-16: 10 mg via ORAL
  Administered 2015-07-16: 5 mg via ORAL
  Filled 2015-07-15 (×2): qty 1
  Filled 2015-07-15: qty 2

## 2015-07-15 MED ORDER — LACTATED RINGERS IV SOLN
INTRAVENOUS | Status: DC
  Administered 2015-07-15: 17:00:00 via INTRAVENOUS

## 2015-07-15 MED ORDER — GLYCOPYRROLATE 0.2 MG/ML IJ SOLN
INTRAMUSCULAR | Status: DC | PRN
  Administered 2015-07-15: .4 mg via INTRAVENOUS

## 2015-07-15 MED ORDER — METOCLOPRAMIDE HCL 5 MG PO TABS: 5.0000 mg | ORAL_TABLET | Freq: Three times a day (TID) | ORAL | Status: DC | PRN

## 2015-07-15 MED ORDER — DOCUSATE SODIUM 100 MG PO CAPS
100.0000 mg | ORAL_CAPSULE | Freq: Two times a day (BID) | ORAL | Status: DC
  Administered 2015-07-15 – 2015-07-16 (×2): 100 mg via ORAL
  Filled 2015-07-15 (×2): qty 1

## 2015-07-15 MED ORDER — PROPOFOL 10 MG/ML IV BOLUS
INTRAVENOUS | Status: DC | PRN
  Administered 2015-07-15: 50 mg via INTRAVENOUS
  Administered 2015-07-15: 120 mg via INTRAVENOUS

## 2015-07-15 MED ORDER — INFLUENZA VAC SPLIT QUAD 0.5 ML IM SUSY
0.5000 mL | PREFILLED_SYRINGE | INTRAMUSCULAR | Status: AC
  Administered 2015-07-16: 0.5 mL via INTRAMUSCULAR
  Filled 2015-07-15: qty 0.5

## 2015-07-15 MED ORDER — LIDOCAINE HCL (CARDIAC) 20 MG/ML IV SOLN
INTRAVENOUS | Status: AC
  Filled 2015-07-15: qty 5

## 2015-07-15 MED ORDER — BISACODYL 5 MG PO TBEC: 5.0000 mg | DELAYED_RELEASE_TABLET | Freq: Every day | ORAL | Status: DC | PRN

## 2015-07-15 MED ORDER — 0.9 % SODIUM CHLORIDE (POUR BTL) OPTIME
TOPICAL | Status: DC | PRN
  Administered 2015-07-15: 1000 mL

## 2015-07-15 MED ORDER — PROPOFOL 10 MG/ML IV BOLUS
INTRAVENOUS | Status: AC
  Filled 2015-07-15: qty 20

## 2015-07-15 MED ORDER — CEFAZOLIN SODIUM-DEXTROSE 2-3 GM-% IV SOLR
2.0000 g | Freq: Four times a day (QID) | INTRAVENOUS | Status: AC
  Administered 2015-07-15 – 2015-07-16 (×3): 2 g via INTRAVENOUS
  Filled 2015-07-15 (×4): qty 50

## 2015-07-15 MED ORDER — ONDANSETRON HCL 4 MG PO TABS: 4.0000 mg | ORAL_TABLET | Freq: Four times a day (QID) | ORAL | Status: DC | PRN

## 2015-07-15 MED ORDER — ONDANSETRON HCL 4 MG/2ML IJ SOLN
INTRAMUSCULAR | Status: DC | PRN
  Administered 2015-07-15: 4 mg via INTRAVENOUS

## 2015-07-15 MED ORDER — CEFAZOLIN SODIUM-DEXTROSE 2-3 GM-% IV SOLR
INTRAVENOUS | Status: AC
  Filled 2015-07-15: qty 50

## 2015-07-15 MED ORDER — FENTANYL CITRATE (PF) 100 MCG/2ML IJ SOLN
INTRAMUSCULAR | Status: DC | PRN
  Administered 2015-07-15: 100 ug via INTRAVENOUS
  Administered 2015-07-15: 50 ug via INTRAVENOUS

## 2015-07-15 MED ORDER — HYDROCHLOROTHIAZIDE 12.5 MG PO CAPS
12.5000 mg | ORAL_CAPSULE | ORAL | Status: DC
  Administered 2015-07-16: 12.5 mg via ORAL
  Filled 2015-07-15: qty 1

## 2015-07-15 MED ORDER — MIDAZOLAM HCL 2 MG/2ML IJ SOLN: 0.5000 mg | Freq: Once | INTRAMUSCULAR | Status: DC | PRN

## 2015-07-15 MED ORDER — DEXTROSE 5 % IV SOLN
10.0000 mg | INTRAVENOUS | Status: DC | PRN
  Administered 2015-07-15: 25 ug/min via INTRAVENOUS

## 2015-07-15 MED ORDER — DIPHENHYDRAMINE HCL 12.5 MG/5ML PO ELIX: 12.5000 mg | ORAL_SOLUTION | ORAL | Status: DC | PRN

## 2015-07-15 MED ORDER — MIDAZOLAM HCL 2 MG/2ML IJ SOLN: 1.0000 mg | INTRAMUSCULAR | Status: DC | PRN

## 2015-07-15 MED ORDER — BUPIVACAINE-EPINEPHRINE (PF) 0.5% -1:200000 IJ SOLN
INTRAMUSCULAR | Status: DC | PRN
  Administered 2015-07-15: 30 mL via PERINEURAL

## 2015-07-15 MED ORDER — NEOSTIGMINE METHYLSULFATE 10 MG/10ML IV SOLN
INTRAVENOUS | Status: DC | PRN
  Administered 2015-07-15: 3 mg via INTRAVENOUS

## 2015-07-15 MED ORDER — PHENYLEPHRINE HCL 10 MG/ML IJ SOLN
INTRAMUSCULAR | Status: DC | PRN
  Administered 2015-07-15 (×2): 80 ug via INTRAVENOUS

## 2015-07-15 MED ORDER — ONDANSETRON HCL 4 MG/2ML IJ SOLN
4.0000 mg | Freq: Four times a day (QID) | INTRAMUSCULAR | Status: DC | PRN
  Administered 2015-07-15: 4 mg via INTRAVENOUS
  Filled 2015-07-15: qty 2

## 2015-07-15 MED ORDER — ASPIRIN EC 81 MG PO TBEC
81.0000 mg | DELAYED_RELEASE_TABLET | Freq: Two times a day (BID) | ORAL | Status: DC
  Administered 2015-07-15 – 2015-07-16 (×2): 81 mg via ORAL
  Filled 2015-07-15 (×2): qty 1

## 2015-07-15 MED ORDER — ACETAMINOPHEN 325 MG PO TABS: 650.0000 mg | ORAL_TABLET | Freq: Four times a day (QID) | ORAL | Status: DC | PRN

## 2015-07-15 MED ORDER — PROMETHAZINE HCL 25 MG/ML IJ SOLN
6.2500 mg | INTRAMUSCULAR | Status: DC | PRN
Start: 2015-07-15 — End: 2015-07-15

## 2015-07-15 MED ORDER — MENTHOL 3 MG MT LOZG
1.0000 | LOZENGE | OROMUCOSAL | Status: DC | PRN
Start: 2015-07-15 — End: 2015-07-16

## 2015-07-15 MED ORDER — ALUM & MAG HYDROXIDE-SIMETH 200-200-20 MG/5ML PO SUSP: 30.0000 mL | ORAL | Status: DC | PRN

## 2015-07-15 MED ORDER — ACETAMINOPHEN 650 MG RE SUPP: 650.0000 mg | Freq: Four times a day (QID) | RECTAL | Status: DC | PRN

## 2015-07-15 MED ORDER — GLYCOPYRROLATE 0.2 MG/ML IJ SOLN
INTRAMUSCULAR | Status: AC
  Filled 2015-07-15: qty 2

## 2015-07-15 MED ORDER — FENTANYL CITRATE (PF) 100 MCG/2ML IJ SOLN
INTRAMUSCULAR | Status: AC
  Administered 2015-07-15: 50 ug via INTRAVENOUS
  Filled 2015-07-15: qty 2

## 2015-07-15 MED ORDER — ROCURONIUM BROMIDE 100 MG/10ML IV SOLN
INTRAVENOUS | Status: DC | PRN
  Administered 2015-07-15: 50 mg via INTRAVENOUS

## 2015-07-15 MED ORDER — FENTANYL CITRATE (PF) 100 MCG/2ML IJ SOLN
50.0000 ug | INTRAMUSCULAR | Status: DC | PRN
  Administered 2015-07-15: 50 ug via INTRAVENOUS

## 2015-07-15 MED ORDER — MIDAZOLAM HCL 2 MG/2ML IJ SOLN
INTRAMUSCULAR | Status: AC
  Administered 2015-07-15: 1 mg
  Filled 2015-07-15: qty 2

## 2015-07-15 SURGICAL SUPPLY — 68 items
BLADE SAW SGTL 83.5X18.5 (BLADE) ×3 IMPLANT
BOWL SMART MIX CTS (DISPOSABLE) ×3 IMPLANT
CAPT SHLDR TOTAL 2 ×3 IMPLANT
CEMENT BONE DEPUY (Cement) ×6 IMPLANT
CEMENT RESTRICTOR DEPUY SZ 3 (Cement) ×6 IMPLANT
COVER SURGICAL LIGHT HANDLE (MISCELLANEOUS) ×3 IMPLANT
DERMABOND ADHESIVE PROPEN (GAUZE/BANDAGES/DRESSINGS)
DERMABOND ADVANCED (GAUZE/BANDAGES/DRESSINGS) ×2
DERMABOND ADVANCED .7 DNX12 (GAUZE/BANDAGES/DRESSINGS) ×1 IMPLANT
DERMABOND ADVANCED .7 DNX6 (GAUZE/BANDAGES/DRESSINGS) IMPLANT
DRAPE IMP U-DRAPE 54X76 (DRAPES) ×3 IMPLANT
DRAPE INCISE IOBAN 66X45 STRL (DRAPES) IMPLANT
DRAPE ORTHO SPLIT 77X108 STRL (DRAPES) ×4
DRAPE SURG 17X11 SM STRL (DRAPES) ×3 IMPLANT
DRAPE SURG ORHT 6 SPLT 77X108 (DRAPES) ×2 IMPLANT
DRAPE U-SHAPE 47X51 STRL (DRAPES) ×3 IMPLANT
DRILL BIT 7/64X5 (BIT) IMPLANT
DRSG AQUACEL AG ADV 3.5X10 (GAUZE/BANDAGES/DRESSINGS) ×3 IMPLANT
DURAPREP 26ML APPLICATOR (WOUND CARE) ×3 IMPLANT
ELECT BLADE 4.0 EZ CLEAN MEGAD (MISCELLANEOUS) ×3
ELECT CAUTERY BLADE 6.4 (BLADE) ×3 IMPLANT
ELECT REM PT RETURN 9FT ADLT (ELECTROSURGICAL) ×3
ELECTRODE BLDE 4.0 EZ CLN MEGD (MISCELLANEOUS) ×1 IMPLANT
ELECTRODE REM PT RTRN 9FT ADLT (ELECTROSURGICAL) ×1 IMPLANT
FACESHIELD WRAPAROUND (MASK) ×9 IMPLANT
GLOVE BIO SURGEON STRL SZ7 (GLOVE) ×3 IMPLANT
GLOVE BIO SURGEON STRL SZ7.5 (GLOVE) ×3 IMPLANT
GLOVE BIO SURGEON STRL SZ8 (GLOVE) ×3 IMPLANT
GLOVE EUDERMIC 7 POWDERFREE (GLOVE) ×3 IMPLANT
GLOVE SS BIOGEL STRL SZ 7.5 (GLOVE) ×2 IMPLANT
GLOVE SUPERSENSE BIOGEL SZ 7.5 (GLOVE) ×4
GOWN STRL REUS W/ TWL LRG LVL3 (GOWN DISPOSABLE) ×1 IMPLANT
GOWN STRL REUS W/ TWL XL LVL3 (GOWN DISPOSABLE) ×2 IMPLANT
GOWN STRL REUS W/TWL LRG LVL3 (GOWN DISPOSABLE) ×2
GOWN STRL REUS W/TWL XL LVL3 (GOWN DISPOSABLE) ×4
KIT BASIN OR (CUSTOM PROCEDURE TRAY) ×3 IMPLANT
KIT ROOM TURNOVER OR (KITS) ×3 IMPLANT
MANIFOLD NEPTUNE II (INSTRUMENTS) ×3 IMPLANT
NDL SUT 6 .5 CRC .975X.05 MAYO (NEEDLE) ×1 IMPLANT
NEEDLE HYPO 25GX1X1/2 BEV (NEEDLE) IMPLANT
NEEDLE MAYO TAPER (NEEDLE) ×2
NS IRRIG 1000ML POUR BTL (IV SOLUTION) ×3 IMPLANT
PACK SHOULDER (CUSTOM PROCEDURE TRAY) ×3 IMPLANT
PAD ARMBOARD 7.5X6 YLW CONV (MISCELLANEOUS) ×6 IMPLANT
PASSER SUT SWANSON 36MM LOOP (INSTRUMENTS) IMPLANT
PIN METAGLENE 2.5 (PIN) ×3 IMPLANT
RESTRAINT HEAD UNIVERSAL NS (MISCELLANEOUS) ×3 IMPLANT
SLEEVE CABLE 1.6MM (Orthopedic Implant) IMPLANT
SLEEVE CABLE 2MM VT (Orthopedic Implant) ×3 IMPLANT
SLING ARM LRG ADULT FOAM STRAP (SOFTGOODS) ×3 IMPLANT
SLING ARM XL FOAM STRAP (SOFTGOODS) IMPLANT
SMARTMIX MINI TOWER (MISCELLANEOUS) ×3
SPONGE LAP 18X18 X RAY DECT (DISPOSABLE) IMPLANT
SPONGE LAP 4X18 X RAY DECT (DISPOSABLE) IMPLANT
SUCTION FRAZIER TIP 10 FR DISP (SUCTIONS) ×3 IMPLANT
SUT BONE WAX W31G (SUTURE) IMPLANT
SUT FIBERWIRE #2 38 T-5 BLUE (SUTURE) ×12
SUT MNCRL AB 3-0 PS2 18 (SUTURE) ×3 IMPLANT
SUT VIC AB 1 CT1 27 (SUTURE) ×4
SUT VIC AB 1 CT1 27XBRD ANBCTR (SUTURE) ×2 IMPLANT
SUT VIC AB 2-0 CT1 27 (SUTURE) ×2
SUT VIC AB 2-0 CT1 TAPERPNT 27 (SUTURE) ×1 IMPLANT
SUTURE FIBERWR #2 38 T-5 BLUE (SUTURE) ×4 IMPLANT
SYR CONTROL 10ML LL (SYRINGE) IMPLANT
TOWEL OR 17X24 6PK STRL BLUE (TOWEL DISPOSABLE) ×3 IMPLANT
TOWEL OR 17X26 10 PK STRL BLUE (TOWEL DISPOSABLE) ×3 IMPLANT
TOWER SMARTMIX MINI (MISCELLANEOUS) ×1 IMPLANT
WATER STERILE IRR 1000ML POUR (IV SOLUTION) IMPLANT

## 2015-07-15 NOTE — Transfer of Care (Signed)
Immediate Anesthesia Transfer of Care Note  Patient: [name]  Procedure(s) Performed: Procedure(s): RIGHT TOTAL SHOULDER ARTHROPLASTY (Right)  Patient Location: PACU  Anesthesia Type:General and Regional  Level of Consciousness: awake, alert  and oriented  Airway & Oxygen Therapy: Patient Spontanous Breathing and Patient connected to face mask oxygen  Post-op Assessment: Report given to RN and Post -op Vital signs reviewed and stable  Post vital signs: Reviewed and stable  Last Vitals:  Filed Vitals:   07/15/15 0930  BP: 163/58  Pulse: 76  Temp:   Resp: 15    Complications: No apparent anesthesia complications

## 2015-07-15 NOTE — Discharge Instructions (Signed)

## 2015-07-15 NOTE — Op Note (Signed)
07/15/2015  12:31 PM  PATIENT:   [name]  81 y.o. female  PRE-OPERATIVE DIAGNOSIS:  right shoulder osteoarthritis  POST-OPERATIVE DIAGNOSIS:  same  PROCEDURE:  R TSA #8 cemented stem, 44x15 eccentric head, 44 glenoid  SURGEON:  Supple, Vania Rea M.D.  ASSISTANTS: Shuford pac   ANESTHESIA:   GET + ISB  EBL: 250  SPECIMEN:  none  Drains: none   PATIENT DISPOSITION:  PACU - hemodynamically stable.    PLAN OF CARE: Admit for overnight observation  Dictation# [id number]   Contact # [phone]

## 2015-07-15 NOTE — Op Note (Signed)
NAMEVERNE, COVE NO.:  [id number]  MEDICAL RECORD NO.:  [id number]  LOCATION:  [facility]                         FACILITY:  [facility]  PHYSICIAN:  Vania Rea. Supple, M.D.  DATE OF BIRTH:  [birth year]  DATE OF PROCEDURE:  07/15/2015 DATE OF DISCHARGE:                              OPERATIVE REPORT   PREOPERATIVE DIAGNOSIS:  End-stage right shoulder osteoarthritis.  POSTOPERATIVE DIAGNOSIS:  End-stage right shoulder osteoarthritis.  PROCEDURE:  Right total shoulder arthroplasty utilizing a cemented size 8 DePuy Modular stem with a 44 x 15 eccentric head, and a cemented pegged 44 glenoid.  SURGEON:  Vania Rea. Supple, M.D.  Threasa HeadsFrench Ana A. Shuford, PA-C.  ANESTHESIA:  General endotracheal as well as an interscalene block.  ESTIMATED BLOOD LOSS:  250 mL.  DRAINS:  None.  HISTORY:  Ms. Polsky is a 81 year old female who has had chronic progressive increasing right shoulder pain, weakness, and significant functional limitations with profound loss of motion related to end-stage right shoulder osteoarthritis.  Her plain radiographs show collapse of the humeral head with enlarged peripheral osteophytes and bone-on-bone contact.  She is brought to the operating room at this time for planned right total shoulder arthroplasty.  Preoperatively, I counseled Ms. Cullin regarding treatment options and the potential risks versus benefits thereof.  Possible surgical complications were all reviewed including bleeding, infection, neurovascular injury, persistent pain, loss of motion, anesthetic complications, failure of the implant, possible need for additional surgery.  She understands and accepts and agrees with our planned procedure.  DESCRIPTION OF PROCEDURE:  After undergoing routine preop evaluation, the patient received prophylactic antibiotics and an interscalene block was established in the holding area by the Anesthesia Department. Placed supine on the operating  table, underwent smooth induction of a general endotracheal anesthesia.  Placed into beach-chair position and appropriately padded and protected.  Right shoulder girdle region was sterilely prepped and draped in standard fashion.  Time-out was called. An anterior deltopectoral approach to the right shoulder was made through an anterior 10 cm incision.  Skin flaps were elevated. Electrocautery was used for hemostasis.  Dissection was carried deeply down to the deltopectoral interval and cephalic vein taken laterally with the deltoid.  Upper cm of pec major tenotomized for exposure. Adhesions divided in deltoid and this was then retracted laterally with a Museum/gallery curator.  The conjoined tendon was identified, mobilized, and retracted medially.  The biceps tendon was unroofed from the bicipital groove, Tenotomized for later tenodesis, and then we split the rotator cuff from the apex of the bicipital groove to the base of the coracoid and then the subscap was elevated away from the lesser tuberosity leaving a 1 cm cuff of tissue for later repair.  The free margin of the subscapularis was tagged with a series of grasping #2 FiberWire sutures. We then divided the capsule tissues from the anteroinferior and inferior aspects of the humeral head where there was indeed a massive osteophyte. Dissection carefully allowed the humeral head to deliver through the wound and release all the capsular attachments.  I then used a rongeur to remove the large osteophyte at the inferior aspect of the humeral head.  The humeral head showed  significant collapse with eburnation of the bone and changes suggestive of AVN.  We outlined our proposed humeral head resection using the extramedullary guide, 30 degrees of retroversion, and this was then completed using an oscillating saw.  We took care to protect the rotator cuff superiorly and posteriorly.  We then completed removal of peripheral osteophytes.  We then hand  reamed the canal up to initially a size 10 and attempted to broach up to size 10, although we did note a defect in the medial calcar as we were broaching with a size 10 and given this medial calcar defect, I went ahead and selected a Dall-Miles cable, which we carefully passed in a subperiosteal fashion about the metaphyseal region of the humerus and this was appropriately tensioned to allow support and to prevent any propagation of the small medial cortical defect.  We then completed our broaching and because the bone in the proximal humerus had been so sclerotic, we were unable to gain proper fit of the size 10 broach and so we elected to use a size 8 stem and cement this for proper fixation. At this point, we placed a metal cap over the cut surface of the proximal humerus.  We turned our attention to the glenoid.  We performed a circumferential labral resection, divided the capsule tissues, and allowed release of the subscapularis anteriorly.  A guidepin was then placed into the center of the glenoid.  We reamed with a 44 reamer down to a stable subchondral bony bed and did perform correction of perhaps 10 degrees of the native retroversion.  Central drill hole was then placed followed by the peripheral peg holes and this then allowed excellent fit of our 44 glenoid.  The glenoid was cleaned and dried. Cement was mixed and introduced into the peripheral peg holes and final 44 glenoid was then impacted in position with excellent fit and fixation.  At this point, we returned our attention to the proximal humerus.  We again broached, confirmed the size 8 properly, we placed a distal cement plug.  The humeral canal was cleaned and dried.  Cement was mixed and we hand packed cement up into the humeral canal and then placed our final size 8 modular stem to the appropriate level with excellent fit and fixation.  Extra cement was removed and we took care to clean all cement away from the  locking screw, which attached the metaphyseal to the stemmed portion of our implant.  At this point then we performed a series of trial reductions and ultimately felt as though the 44 x 15 eccentric head offered the best soft tissue balance.  The final 44 x 15 eccentric head was placed into proper position, impacted, final reduction performed.  Shoulder motion and stability was excellent much to our satisfaction with good soft tissue balance.  At this point, we then repaired the subscapularis back to lesser tuberosity using the #2 FiberWires and then repaired the rotator interval with a pair of figure-of-eight #2 FiberWire sutures.  The biceps tendon was then tenodesed to the upper border of the pec major.  The wound was irrigated.  Hemostasis was obtained.  The deltopectoral interval was then reapproximated with a series of figure-of-eight #1 Vicryl sutures. 2-0 Vicryl was used in the subcu layer and intracuticular 3-0 Monocryl for the skin followed by Dermabond and an Aquacel dressing.  Right arm was then placed in a sling.  The patient was awakened, extubated, and taken to the recovery room in stable condition.  Tracy A. Shuford, PA-C was used as an Geophysicist/field seismologist throughout this case and essential for help with positioning of the patient, positioning of the extremity, management of the retractors, tissue manipulation, implantation of prosthesis, wound closure, and intraoperative decision making.     Vania Rea. Supple, M.D.     KMS/MEDQ  D:  07/15/2015  T:  07/15/2015  Job:  [id number]

## 2015-07-15 NOTE — Evaluation (Signed)
Physical Therapy Evaluation and Discharge Patient Details Name: [name] MRN: [id number] DOB: 17-Mar-1934 Today's Date: 07/15/2015   History of Present Illness  81 y.o. female s/p Right total shoulder arthroplasty  Clinical Impression  Patient evaluated by Physical Therapy with no further acute PT needs identified. All education has been completed and the patient has no further questions. Ambulates generally well without need of physical assist or even an assistive device. Safely navigated stairs today and feels confident with her abilities. See below for any follow-up Physial Therapy or equipment needs. PT is signing off. Thank you for this referral.     Follow Up Recommendations No PT follow up;Supervision - Intermittent    Equipment Recommendations  None recommended by PT    Recommendations for Other Services       Precautions / Restrictions Precautions Precautions: Shoulder Precaution Comments: Reviewed NWB status Required Braces or Orthoses: Sling Restrictions Weight Bearing Restrictions: Yes RUE Weight Bearing: Non weight bearing      Mobility  Bed Mobility               General bed mobility comments: In recliner  Transfers Overall transfer level: Needs assistance Equipment used: None Transfers: Sit to/from Stand Sit to Stand: Supervision         General transfer comment: Supervision for safety. Slightly guarded but protects RUE and does not need physical assist.  Ambulation/Gait Ambulation/Gait assistance: Supervision Ambulation Distance (Feet): 225 Feet Assistive device: None Gait Pattern/deviations: Step-through pattern;Decreased stride length;Antalgic Gait velocity: decreased   General Gait Details: Minimally antalgic gait pattern, pt reports Hx of knee pain and hip replacement. Minimal sway noted with gait. VC for awareness of surrounding and to avoid objects on Rt side to protect Rt shoulder at all times. Did not require physical assist  with ambulation and declines to use an assistive device.  Stairs Stairs: Yes Stairs assistance: Supervision Stair Management: One rail Left;Step to pattern;Forwards Number of Stairs: 2 (x2) General stair comments: Educated on safe stair navigation with single rail on Lt similar to home environment. Pt completed in slow and guarded manner but safely without need for assistance. VC for sequencing.  Wheelchair Mobility    Modified Rankin (Stroke Patients Only)       Balance Overall balance assessment: Needs assistance Sitting-balance support: No upper extremity supported;Feet supported Sitting balance-Leahy Scale: Normal     Standing balance support: No upper extremity supported Standing balance-Leahy Scale: Good                               Pertinent Vitals/Pain Pain Assessment: No/denies pain    Home Living Family/patient expects to be discharged to:: Private residence Living Arrangements: Spouse/significant other Available Help at Discharge: Family;Available 24 hours/day Type of Home: House Home Access: Stairs to enter Entrance Stairs-Rails: Doctor, general practice of Steps: 4 Home Layout: One level Home Equipment: Walker - 2 wheels;Bedside commode;Toilet riser;Adaptive equipment;Cane - single point (3 in 1)      Prior Function Level of Independence: Independent               Hand Dominance   Dominant Hand: Right    Extremity/Trunk Assessment   Upper Extremity Assessment: Defer to OT evaluation           Lower Extremity Assessment: Overall WFL for tasks assessed         Communication   Communication: No difficulties  Cognition Arousal/Alertness: Awake/alert Behavior During Therapy: [facility]  for tasks assessed/performed Overall Cognitive Status: Within Functional Limits for tasks assessed                      General Comments General comments (skin integrity, edema, etc.): Reviewed positioning and NWB status     Exercises        Assessment/Plan    PT Assessment Patent does not need any further PT services  PT Diagnosis Abnormality of gait   PT Problem List    PT Treatment Interventions     PT Goals (Current goals can be found in the Care Plan section) Acute Rehab PT Goals Patient Stated Goal: Go home - I didn't realize I would do this well. PT Goal Formulation: All assessment and education complete, DC therapy    Frequency     Barriers to discharge        Co-evaluation               End of Session   Activity Tolerance: Patient tolerated treatment well Patient left: in chair;with call bell/phone within reach;with nursing/sitter in room Nurse Communication: Mobility status         Time: 4098-1191 PT Time Calculation (min) (ACUTE ONLY): 21 min   Charges:   PT Evaluation $Initial PT Evaluation Tier I: 1 Procedure     PT G CodesBerton Mount 07/15/2015, 6:20 PM Sunday Spill[place], [place] [phone]

## 2015-07-15 NOTE — H&P (Signed)
Regis Bill    Chief Complaint: right shoulder osteoarthritis HPI: The patient is a 81 y.o. female with end stage right shoulder OA  Past Medical History  Diagnosis Date  . PONV (postoperative nausea and vomiting)     severe  . Peptic ulcer disease with hemorrhage 10/13/2008    admitted to hospital .had 4 blood transfusions  . Hypertension   . Neuromuscular disorder     left knee  . History of blood transfusion 2009  . Arthritis     "shoulders, knees, right thumb" (08/15/2012)  . Hypothyroidism     not on meds at present. MD watching  . Anxiety   . Depression   . Chronic kidney disease   . Incontinence     Past Surgical History  Procedure Laterality Date  . Tonsillectomy and adenoidectomy  ~ 1945  . Nasal polyp excision  ~ 1946  . Shoulder surgery  ~ 2003    "bone spur removed; left side"  . Reverse shoulder arthroplasty  08/15/2012    Procedure: REVERSE SHOULDER ARTHROPLASTY;  Surgeon: Senaida Lange, MD;  Location: [facility];  Service: Orthopedics;  Laterality: Left;  . Total knee arthroplasty Right 10/31/2013    Procedure: RIGHT TOTAL KNEE ARTHROPLASTY;  Surgeon: Loanne Drilling, MD;  Location: [facility];  Service: Orthopedics;  Laterality: Right;  . Injection knee Left 10/31/2013    Procedure: CORTIZONE INJECTION LEFT KNEE ;  Surgeon: Loanne Drilling, MD;  Location: [facility];  Service: Orthopedics;  Laterality: Left;  injected at 1624 under anesthesia  . Total hip arthroplasty Left 08/12/2014    Procedure: LEFT TOTAL HIP ARTHROPLASTY ANTERIOR APPROACH;  Surgeon: Loanne Drilling, MD;  Location: [facility];  Service: Orthopedics;  Laterality: Left;  . Cataract extraction Bilateral     Family History  Problem Relation Age of Onset  . Arthritis Mother   . Hypertension Mother   . Transient ischemic attack Mother   . Alcohol abuse Father   . Hypertension Brother   . Stroke Brother   . Prostate cancer Brother   . Diabetes Son   . CAD Brother     Social History:  reports that  she has never smoked. She has never used smokeless tobacco. She reports that she does not drink alcohol or use illicit drugs.  Allergies:  Allergies  Allergen Reactions  . Fleet Liquid Glycerin [Glycerin] Other (See Comments)    Got very sick  . Morphine And Related Other (See Comments)    Makes me crazy  . Scallops [Shellfish Allergy] Nausea And Vomiting and Other (See Comments)    Able to eat shrimp and other shellfish    Medications Prior to Admission  Medication Sig Dispense Refill  . acetaminophen (TYLENOL) 500 MG tablet Take 1,000 mg by mouth every 6 (six) hours as needed for moderate pain.    . Ascorbic Acid (VITAMIN C) 1000 MG tablet Take 1,000 mg by mouth daily.    . Calcium Carbonate (CALCIUM 600 PO) Take 600 mg by mouth daily.    . Doxylamine Succinate, Sleep, (SLEEP AID PO) Take 1 tablet by mouth at bedtime.    . fludrocortisone (FLORINEF) 0.1 MG tablet Take 0.05 mg by mouth daily.     . Folic Acid 20 MG CAPS Take 20 mg by mouth daily.     . hydrochlorothiazide (MICROZIDE) 12.5 MG capsule Take 12.5 mg by mouth every morning.     . latanoprost (XALATAN) 0.005 % ophthalmic solution Place 1 drop into both  eyes at bedtime.    . metoprolol tartrate (LOPRESSOR) 25 MG tablet Take 25 mg by mouth 2 (two) times daily.    . Multiple Vitamin (MULTIVITAMIN) capsule Take 1 capsule by mouth daily.     . niacin 500 MG tablet Take 500 mg by mouth daily.    . Omega-3 Fatty Acids (FISH OIL) 1200 MG CAPS Take 1,200 mg by mouth daily.    . traMADol (ULTRAM) 50 MG tablet Take 1 tablet (50 mg total) by mouth every 4 (four) hours as needed. (Patient taking differently: Take 50 mg by mouth every 4 (four) hours as needed for moderate pain. ) 60 tablet 5  . vitamin A (CVS VITAMIN A) 10000 UNIT capsule Take 10,000 Units by mouth daily.     . vitamin B-12 (CYANOCOBALAMIN) 1000 MCG tablet Take 1,000 mcg by mouth daily.    Marland Kitchen aspirin EC 81 MG tablet Take 81 mg by mouth 2 (two) times daily.     .  hydrochlorothiazide (HYDRODIURIL) 25 MG tablet TAKE ONE TABLET BY MOUTH ONCE DAILY (Patient not taking: Reported on 07/01/2015) 90 tablet 3     Physical Exam: right shoulder with painful and restricted motion as noted at recent office visits.   Vitals  Temp:  [97.1 F (36.2 C)] 97.1 F (36.2 C) (09/22 0826) Pulse Rate:  [71] 71 (09/22 0826) Resp:  [20] 20 (09/22 0826) BP: (174)/(73) 174/73 mmHg (09/22 0826) SpO2:  [97 %] 97 % (09/22 0826) Weight:  [69.854 kg (154 lb)] 69.854 kg (154 lb) (09/22 0829)  Assessment/Plan  Impression: right shoulder osteoarthritis  Plan of Action: Procedure(s): RIGHT TOTAL SHOULDER ARTHROPLASTY  KEVIN M SUPPLE 07/15/2015, 9:22 AM Contact # [phone]

## 2015-07-15 NOTE — Anesthesia Postprocedure Evaluation (Signed)
  Anesthesia Post-op Note  Patient: [name]  Procedure(s) Performed: Procedure(s): RIGHT TOTAL SHOULDER ARTHROPLASTY (Right)  Patient Location: PACU  Anesthesia Type:GA combined with regional for post-op pain  Level of Consciousness: awake, alert , oriented and patient cooperative  Airway and Oxygen Therapy: Patient Spontanous Breathing  Post-op Pain: none  Post-op Assessment: Post-op Vital signs reviewed, Patient's Cardiovascular Status Stable, Respiratory Function Stable, Patent Airway, No signs of Nausea or vomiting and Pain level controlled              Post-op Vital Signs: Reviewed and stable  Last Vitals:  Filed Vitals:   07/15/15 1330  BP: 123/57  Pulse: 71  Temp:   Resp: 14    Complications: No apparent anesthesia complications

## 2015-07-15 NOTE — Anesthesia Procedure Notes (Addendum)
Anesthesia Regional Block:  Interscalene brachial plexus block  Pre-Anesthetic Checklist: ,, timeout performed, Correct Patient, Correct Site, Correct Laterality, Correct Procedure, Correct Position, site marked, Risks and benefits discussed,  Surgical consent,  Pre-op evaluation,  At surgeon's request and post-op pain management  Laterality: Right and Upper  Prep: chloraprep       Needles:  Injection technique: Single-shot  Needle Type: Echogenic Stimulator Needle     Needle Length: 5cm 5 cm Needle Gauge: 22 and 22 G    Additional Needles:  Procedures: nerve stimulator Interscalene brachial plexus block  Nerve Stimulator or Paresthesia:  Response: forearm twitch, 0.4 mA, 0.1 ms,   Additional Responses:   Narrative:  Start time: 07/15/2015 9:20 AM End time: 07/15/2015 9:26 AM Injection made incrementally with aspirations every 5 mL.  Performed by: Personally  Anesthesiologist: Jean Rosenthal, CARSWELL  Additional Notes: Pt identified in Holding room.  Monitors applied. Working IV access confirmed. Sterile prep R neck.  #22ga PNS to forearm twitch at 0.90mA threshold.  30cc 0.5% Bupivacaine with 1:200k epi injected incrementally after negative test dose.  Patient asymptomatic, VSS, no heme aspirated, tolerated well.  Sandford Craze, MD   Procedure Name: Intubation Date/Time: 07/15/2015 10:12 AM Performed by: Marena Chancy Pre-anesthesia Checklist: Patient identified, Emergency Drugs available, Suction available, Patient being monitored and Timeout performed Patient Re-evaluated:Patient Re-evaluated prior to inductionPreoxygenation: Pre-oxygenation with 100% oxygen Intubation Type: IV induction Ventilation: Two handed mask ventilation required Laryngoscope Size: Mac and 4 Grade View: Grade I Tube type: Oral Tube size: 7.0 mm Number of attempts: 1 Airway Equipment and Method: Stylet Placement Confirmation: ETT inserted through vocal cords under direct vision,  positive ETCO2  and CO2 detector Secured at: 22 cm Tube secured with: Tape Dental Injury: Teeth and Oropharynx as per pre-operative assessment  Comments: Performed by Madaline Guthrie

## 2015-07-15 NOTE — Anesthesia Preprocedure Evaluation (Addendum)
Anesthesia Evaluation  Patient identified by MRN, date of birth, ID band Patient awake    Reviewed: Allergy & Precautions, NPO status , Patient's Chart, lab work & pertinent test results, reviewed documented beta blocker date and time   History of Anesthesia Complications (+) PONV and history of anesthetic complications  Airway Mallampati: II  TM Distance: >3 FB Neck ROM: Full    Dental  (+) Dental Advisory Given   Pulmonary neg pulmonary ROS,    breath sounds clear to auscultation       Cardiovascular hypertension, Pt. on medications and Pt. on home beta blockers + angina  Rhythm:Regular Rate:Normal     Neuro/Psych Anxiety negative neurological ROS     GI/Hepatic negative GI ROS, Neg liver ROS,   Endo/Other  Hypothyroidism   Renal/GU Renal InsufficiencyRenal disease (creat 1.02)     Musculoskeletal  (+) Arthritis , Osteoarthritis,    Abdominal   Peds  Hematology negative hematology ROS (+)   Anesthesia Other Findings   Reproductive/Obstetrics                           Anesthesia Physical Anesthesia Plan  ASA: III  Anesthesia Plan: General   Post-op Pain Management: GA combined w/ Regional for post-op pain   Induction: Intravenous  Airway Management Planned: Oral ETT  Additional Equipment:   Intra-op Plan:   Post-operative Plan: Extubation in OR  Informed Consent: I have reviewed the patients History and Physical, chart, labs and discussed the procedure including the risks, benefits and alternatives for the proposed anesthesia with the patient or authorized representative who has indicated his/her understanding and acceptance.   Dental advisory given  Plan Discussed with: CRNA and Surgeon  Anesthesia Plan Comments: (Plan routine monitors, GETA with interscalene block for postop analgesia)        Anesthesia Quick Evaluation

## 2015-07-16 DIAGNOSIS — M19011 Primary osteoarthritis, right shoulder: Secondary | ICD-10-CM | POA: Diagnosis not present

## 2015-07-16 MED ORDER — OXYCODONE-ACETAMINOPHEN 5-325 MG PO TABS
1.0000 | ORAL_TABLET | ORAL | Status: DC | PRN
Start: 2015-07-16 — End: 2015-08-12

## 2015-07-16 MED ORDER — DIAZEPAM 5 MG PO TABS: 2.5000 mg | ORAL_TABLET | Freq: Four times a day (QID) | ORAL | Status: DC | PRN

## 2015-07-16 NOTE — Evaluation (Signed)
Occupational Therapy Evaluation and Discharge Patient Details Name: [name] MRN: [id number] DOB: 04/06/1934 Today's Date: 07/16/2015    History of Present Illness 81 y.o. female s/p Right total shoulder arthroplasty   Clinical Impression   Pt and husband educated in passive protocol exercises and pendulums, hemitechniques for bathing and dressing, donning and doffing sling, positioning R UE in recliner, and NWB status of R UE.  Verbalize understanding of all.  Assisted pt in ADL in preparation for discharge.  Husband is available to assist pt 24 hours as needed.  Handouts provided to reinforce education.     Follow Up Recommendations  No OT follow up    Equipment Recommendations  None recommended by OT    Recommendations for Other Services       Precautions / Restrictions Precautions Precautions: Shoulder Type of Shoulder Precautions: passive protoclol Shoulder Interventions: Shoulder sling/immobilizer (can come off when in controlled environment) Precaution Booklet Issued: Yes (comment) Precaution Comments: reviewed handout with pt Required Braces or Orthoses: Sling Restrictions Weight Bearing Restrictions: Yes RUE Weight Bearing: Non weight bearing      Mobility Bed Mobility               General bed mobility comments: In recliner  Transfers Overall transfer level: Modified independent                    Balance                                            ADL Overall ADL's : Needs assistance/impaired Eating/Feeding: Set up;Sitting   Grooming: Modified independent;Standing   Upper Body Bathing: Minimal assitance;Sitting   Lower Body Bathing: Minimal assistance;Sit to/from stand   Upper Body Dressing : Minimal assistance;Sitting   Lower Body Dressing: Minimal assistance;Sit to/from stand   Toilet Transfer: Modified Independent   Toileting- Clothing Manipulation and Hygiene: Sit to/from stand;Modified independent        Functional mobility during ADLs: Modified independent General ADL Comments: Instructed in hemitechniques for bathing and dressing.  Educated in donning and doffing sling.  Demonstrated positioning for comfort in recliner as pt plans to sleep in her recliner. Instructed in NWB status.     Vision     Perception     Praxis      Pertinent Vitals/Pain Pain Assessment: Faces Faces Pain Scale: Hurts little more Pain Location: R shoulder with exercises Pain Descriptors / Indicators: Guarding;Grimacing Pain Intervention(s): Monitored during session;Premedicated before session;Repositioned;Limited activity within patient's tolerance     Hand Dominance Right   Extremity/Trunk Assessment             Communication Communication Communication: No difficulties   Cognition Arousal/Alertness: Awake/alert Behavior During Therapy: WFL for tasks assessed/performed Overall Cognitive Status: Within Functional Limits for tasks assessed                     General Comments       Exercises Exercises: Other exercises Other Exercises Other Exercises: PROM in supine R shoulder FF x 5 (tolerated to 45 degrees) Other Exercises: PROM in sitting R abduction x 10 (tolerated to 45 degrees) Other Exercises: PROM R shoulder ER x 10 in sitting (tolerated to -30) Other Exercises: AROM R elbow to hand x 10   Shoulder Instructions      Home Living Family/patient expects to be discharged to:: Private residence  Living Arrangements: Spouse/significant other Available Help at Discharge: Family;Available 24 hours/day Type of Home: House Home Access: Stairs to enter [employer] of Steps: 4 Entrance Stairs-Rails: Right;Left Home Layout: One level     Bathroom Shower/Tub: Producer, television/film/video: Standard Bathroom Accessibility: Yes How Accessible: Accessible via walker Home Equipment: Walker - 2 wheels;Bedside commode;Toilet riser;Adaptive equipment;Cane - single  point Adaptive Equipment: Reacher;Sock aid;Long-handled shoe horn        Prior Functioning/Environment Level of Independence: Independent             OT Diagnosis:     OT Problem List:     OT Treatment/Interventions:      OT Goals(Current goals can be found in the care plan section) Acute Rehab OT Goals Patient Stated Goal: Go home - I didn't realize I would do this well.  OT Frequency:     Barriers to D/C:            Co-evaluation              End of Session    Activity Tolerance: Patient limited by pain Patient left: in chair;with call bell/phone within reach;with family/visitor present   Time: 0454-0981 OT Time Calculation (min): 36 min Charges:  OT General Charges $OT Visit: 1 Procedure OT Evaluation $Initial OT Evaluation Tier I: 1 Procedure OT Treatments $Therapeutic Exercise: 8-22 mins G-Codes:    Evern Bio 07/16/2015, 10:38 AM  [phone]

## 2015-07-16 NOTE — Progress Notes (Signed)
Pt ready for d/c per MD. Cleared by PT/OT, has no equipment needs. Discharge teaching and prescriptions given to pt and husband at bedside, all questions answered. Will be wheeled out by volunteer services.   [place], [name]

## 2015-07-16 NOTE — Discharge Summary (Signed)
PATIENT ID:      [name]  MRN:     [id number] DOB/AGE:    [birth year] / 81 y.o.     DISCHARGE SUMMARY  ADMISSION DATE:    07/15/2015 DISCHARGE DATE:    ADMISSION DIAGNOSIS: right shoulder osteoarthritis Past Medical History  Diagnosis Date  . PONV (postoperative nausea and vomiting)     severe  . Peptic ulcer disease with hemorrhage 10/13/2008    admitted to hospital .had 4 blood transfusions  . Hypertension   . Neuromuscular disorder     left knee  . History of blood transfusion 2009  . Arthritis     "shoulders, knees, right thumb" (08/15/2012)  . Hypothyroidism     not on meds at present. MD watching  . Anxiety   . Depression   . Chronic kidney disease   . Incontinence     DISCHARGE DIAGNOSIS:   Active Problems:   S/P shoulder replacement   PROCEDURE: Procedure(s): RIGHT TOTAL SHOULDER ARTHROPLASTY on 07/15/2015  CONSULTS:  none   HISTORY:  See H&P in chart.  HOSPITAL COURSE:  [name] is a 81 y.o. admitted on 07/15/2015 with a chief complaint of right shoulder pain and dysfunction, and found to have a diagnosis of right shoulder osteoarthritis.  They were brought to the operating room on 07/15/2015 and underwent Procedure(s): RIGHT TOTAL SHOULDER ARTHROPLASTY.    They were given perioperative antibiotics: Anti-infectives    Start     Dose/Rate Route Frequency Ordered Stop   07/15/15 1600  ceFAZolin (ANCEF) IVPB 2 g/50 mL premix     2 g 100 mL/hr over 30 Minutes Intravenous Every 6 hours 07/15/15 1434 07/16/15 0403   07/15/15 0812  ceFAZolin (ANCEF) 2-3 GM-% IVPB SOLR    Comments:  Jones, Tomika   : cabinet override      07/15/15 0812 07/15/15 2014   07/15/15 0810  ceFAZolin (ANCEF) IVPB 2 g/50 mL premix     2 g 100 mL/hr over 30 Minutes Intravenous On call to O.R. 07/15/15 0810 07/15/15 1014    .  Patient underwent the above named procedure and tolerated it well. The following day they were hemodynamically stable and pain was controlled on oral  analgesics. They were neurovascularly intact to the operative extremity. OT was ordered and worked with patient per protocol. They were medically and orthopaedically stable for discharge on day 1.    DIAGNOSTIC STUDIES:  RECENT RADIOGRAPHIC STUDIES :  No results found.  RECENT VITAL SIGNS:  Patient Vitals for the past 24 hrs:  BP Temp Temp src Pulse Resp SpO2  07/16/15 0341 128/60 mmHg 97.9 F (36.6 C) Oral 88 18 97 %  07/16/15 0000 119/69 mmHg 97.6 F (36.4 C) Oral 93 18 96 %  07/15/15 2140 (!) 146/68 mmHg (!) 96.9 F (36.1 C) Oral 87 16 96 %  07/15/15 1803 (!) 132/56 mmHg 97.8 F (36.6 C) Oral 85 18 95 %  07/15/15 1343 130/84 mmHg - - 74 15 98 %  07/15/15 1330 (!) 123/57 mmHg - - 71 14 99 %  07/15/15 1315 132/69 mmHg 97.8 F (36.6 C) - 72 14 100 %  07/15/15 1300 121/60 mmHg - - 69 18 100 %  07/15/15 1251 136/61 mmHg 97.2 F (36.2 C) - 71 15 100 %  .  RECENT EKG RESULTS:    Orders placed or performed during the hospital encounter of 07/15/15  . EKG 12-Lead  . EKG 12-Lead  . EKG 12-Lead  . EKG  12-Lead    DISCHARGE INSTRUCTIONS:    DISCHARGE MEDICATIONS:     Medication List    STOP taking these medications        acetaminophen 500 MG tablet  Commonly known as:  TYLENOL      TAKE these medications        aspirin EC 81 MG tablet  Take 81 mg by mouth 2 (two) times daily.     CALCIUM 600 PO  Take 600 mg by mouth daily.     CVS VITAMIN A 40981 UNIT capsule  Generic drug:  vitamin A  Take 10,000 Units by mouth daily.     diazepam 5 MG tablet  Commonly known as:  VALIUM  Take 0.5-1 tablets (2.5-5 mg total) by mouth every 6 (six) hours as needed for muscle spasms or sedation.     Fish Oil 1200 MG Caps  Take 1,200 mg by mouth daily.     fludrocortisone 0.1 MG tablet  Commonly known as:  FLORINEF  Take 0.05 mg by mouth daily.     Folic Acid 20 MG Caps  Take 20 mg by mouth daily.     hydrochlorothiazide 12.5 MG capsule  Commonly known as:  MICROZIDE   Take 12.5 mg by mouth every morning.     hydrochlorothiazide 25 MG tablet  Commonly known as:  HYDRODIURIL  TAKE ONE TABLET BY MOUTH ONCE DAILY     latanoprost 0.005 % ophthalmic solution  Commonly known as:  XALATAN  Place 1 drop into both eyes at bedtime.     metoprolol tartrate 25 MG tablet  Commonly known as:  LOPRESSOR  Take 25 mg by mouth 2 (two) times daily.     multivitamin capsule  Take 1 capsule by mouth daily.     niacin 500 MG tablet  Take 500 mg by mouth daily.     oxyCODONE-acetaminophen 5-325 MG per tablet  Commonly known as:  PERCOCET  Take 1-2 tablets by mouth every 4 (four) hours as needed.     SLEEP AID PO  Take 1 tablet by mouth at bedtime.     traMADol 50 MG tablet  Commonly known as:  ULTRAM  Take 1 tablet (50 mg total) by mouth every 4 (four) hours as needed.     vitamin B-12 1000 MCG tablet  Commonly known as:  CYANOCOBALAMIN  Take 1,000 mcg by mouth daily.     vitamin C 1000 MG tablet  Take 1,000 mg by mouth daily.        FOLLOW UP VISIT:       Follow-up Information    Follow up with Vania Rea SUPPLE, MD.   Specialty:  Orthopedic Surgery   Why:  call to be seen in 10-14 dasy   Contact information:   [address] [place] [address] [phone]       DISCHARGE TO: home  DISPOSITION: Good   DISCHARGE CONDITION:  Rodolph Bong for Dr. Francena Hanly 07/16/2015, 9:46 AM

## 2015-07-16 NOTE — Care Management Note (Signed)
Case Management Note  Patient Details  Name: [name] MRN: [id number] Date of Birth: 06/27/1934  Subjective/Objective:          S/p right total shoulder arthroplasty          Action/Plan: PT and OT did not recommend any home therapy or equipment. Spoke with patient and her husband and offered to set up HHC, they did not think that patient needed home therapy. Patient stated that she knew what she is suppose to do and her husband will be available to assist 24/7.   Expected Discharge Date:                  Expected Discharge Plan:  Home/Self Care  In-House Referral:  NA  Discharge planning Services  CM Consult  Post Acute Care Choice:    Choice offered to:     DME Arranged:    DME Agency:     HH Arranged:    HH Agency:     Status of Service:  Completed, signed off  Medicare Important Message Given:    Date Medicare IM Given:    Medicare IM give by:    Date Additional Medicare IM Given:    Additional Medicare Important Message give by:     If discussed at Long Length of Stay Meetings, dates discussed:    Additional Comments:  Monica Becton, RN 07/16/2015, 10:41 AM

## 2015-07-19 ENCOUNTER — Encounter (HOSPITAL_COMMUNITY): Payer: Self-pay | Admitting: Orthopedic Surgery

## 2015-07-27 ENCOUNTER — Telehealth: Payer: Self-pay | Admitting: Family Medicine

## 2015-07-27 NOTE — Telephone Encounter (Signed)
Made apt with Antony Contras at 3:45 on Thursday.  (Pt requested an appointment before next week.)    Thanks,   -Vernona Rieger

## 2015-07-27 NOTE — Telephone Encounter (Signed)
Needs ov. Thanks.  

## 2015-07-27 NOTE — Telephone Encounter (Signed)
Pt says she has been having incontinence and can not sleep at night without having to go to the bathroom several times a night.  She is asking if you can prescribe something.  She uses [facility] on [address].  Call back is    [phone]  Thanks Barth Kirks

## 2015-07-29 ENCOUNTER — Encounter: Payer: Self-pay | Admitting: Physical Therapy

## 2015-07-29 ENCOUNTER — Ambulatory Visit: Payer: Medicare Other | Attending: Orthopedic Surgery | Admitting: Physical Therapy

## 2015-07-29 ENCOUNTER — Encounter: Payer: Self-pay | Admitting: Physician Assistant

## 2015-07-29 ENCOUNTER — Ambulatory Visit (INDEPENDENT_AMBULATORY_CARE_PROVIDER_SITE_OTHER): Payer: Medicare Other | Admitting: Physician Assistant

## 2015-07-29 VITALS — BP 128/70 | HR 71 | Temp 97.7°F | Resp 16 | Wt 156.6 lb

## 2015-07-29 DIAGNOSIS — N3000 Acute cystitis without hematuria: Secondary | ICD-10-CM

## 2015-07-29 DIAGNOSIS — M62838 Other muscle spasm: Secondary | ICD-10-CM | POA: Diagnosis present

## 2015-07-29 DIAGNOSIS — N3946 Mixed incontinence: Secondary | ICD-10-CM

## 2015-07-29 DIAGNOSIS — M6281 Muscle weakness (generalized): Secondary | ICD-10-CM | POA: Diagnosis not present

## 2015-07-29 DIAGNOSIS — R3915 Urgency of urination: Secondary | ICD-10-CM | POA: Insufficient documentation

## 2015-07-29 DIAGNOSIS — R350 Frequency of micturition: Secondary | ICD-10-CM

## 2015-07-29 DIAGNOSIS — Z96611 Presence of right artificial shoulder joint: Secondary | ICD-10-CM | POA: Insufficient documentation

## 2015-07-29 DIAGNOSIS — R32 Unspecified urinary incontinence: Secondary | ICD-10-CM | POA: Insufficient documentation

## 2015-07-29 LAB — POCT URINALYSIS DIPSTICK
BILIRUBIN UA: NEGATIVE
Blood, UA: NEGATIVE
GLUCOSE UA: NEGATIVE
KETONES UA: NEGATIVE
Nitrite, UA: POSITIVE
Protein, UA: NEGATIVE
SPEC GRAV UA: 1.015
Urobilinogen, UA: 0.2
pH, UA: 6.5

## 2015-07-29 MED ORDER — SULFAMETHOXAZOLE-TRIMETHOPRIM 800-160 MG PO TABS
1.0000 | ORAL_TABLET | Freq: Two times a day (BID) | ORAL | Status: DC
Start: 2015-07-29 — End: 2015-08-12

## 2015-07-29 NOTE — Patient Instructions (Signed)

## 2015-07-29 NOTE — Therapy (Signed)
[facility] [address] [place], [address] Phone: [phone]   Fax:  [phone]  Physical Therapy Evaluation  Patient Details  Name: [name] MRN: [id number] Date of Birth: Nov 26, 1933 Referring Provider:  Francena Hanly, MD  Encounter Date: 07/29/2015      PT End of Session - 07/29/15 1918    Visit Number 1   Number of Visits 16   Date for PT Re-Evaluation 09/24/15   Authorization Type 1   Authorization Time Period 10   PT Start Time 1825   PT Stop Time 1910   PT Time Calculation (min) 45 min   Activity Tolerance Patient tolerated treatment well   Behavior During Therapy [facility] for tasks assessed/performed      Past Medical History  Diagnosis Date  . PONV (postoperative nausea and vomiting)     severe  . Peptic ulcer disease with hemorrhage 10/13/2008    admitted to hospital .had 4 blood transfusions  . Hypertension   . Neuromuscular disorder (HCC)     left knee  . History of blood transfusion 2009  . Arthritis     "shoulders, knees, right thumb" (08/15/2012)  . Hypothyroidism     not on meds at present. MD watching  . Anxiety   . Depression   . Chronic kidney disease   . Incontinence     Past Surgical History  Procedure Laterality Date  . Tonsillectomy and adenoidectomy  ~ 1945  . Nasal polyp excision  ~ 1946  . Shoulder surgery Left ~ 2003    "bone spur removed"  . Reverse shoulder arthroplasty  08/15/2012    Procedure: REVERSE SHOULDER ARTHROPLASTY;  Surgeon: Senaida Lange, MD;  [facility];  Service: Orthopedics;  Laterality: Left;  . Total knee arthroplasty Right 10/31/2013    Procedure: RIGHT TOTAL KNEE ARTHROPLASTY;  Surgeon: Loanne Drilling, MD;  Location: [facility];  Service: Orthopedics;  Laterality: Right;  . Injection knee Left 10/31/2013    Procedure: CORTIZONE INJECTION LEFT KNEE ;  Surgeon: Loanne Drilling, MD;  Location: [facility];  Service: Orthopedics;  Laterality:  Left;  injected at 1624 under anesthesia  . Total hip arthroplasty Left 08/12/2014    Procedure: LEFT TOTAL HIP ARTHROPLASTY ANTERIOR APPROACH;  Surgeon: Loanne Drilling, MD;  Location: [facility];  Service: Orthopedics;  Laterality: Left;  . Cataract extraction w/ intraocular lens  implant, bilateral Bilateral 3-01/2015  . Joint replacement    . Total shoulder arthroplasty Right 07/15/2015  . Total shoulder arthroplasty Right 07/15/2015    Procedure: RIGHT TOTAL SHOULDER ARTHROPLASTY;  Surgeon: Francena Hanly, MD;  [facility];  Service: Orthopedics;  Laterality: Right;    There were no vitals filed for this visit.  Visit Diagnosis:  Muscle weakness of right upper extremity - Plan: PT plan of care cert/re-cert  Status post total shoulder replacement, right - Plan: PT plan of care cert/re-cert  Spasm of muscle - Plan: PT plan of care cert/re-cert      Subjective Assessment - 07/29/15 1826    Subjective Patient reports she is having pain in right shoulder and stiffness s/p right TSA 07/14/2015.    Pertinent History long history of right shoulder pain and stiffness with resultant TSA 07/14/15.    Patient Stated Goals To be able to use right arm for personal care and household chores without stiffness or difficulty   Currently in Pain? Yes   Pain Score 5  Pain Location Shoulder   Pain Orientation Right   Pain Descriptors / Indicators Aching   Pain Type Surgical pain   Pain Onset 1 to 4 weeks ago   Pain Frequency Intermittent   Aggravating Factors  unknown   Pain Relieving Factors rest, medication   Effect of Pain on Daily Activities unable to use right UE per s/p surgery protocol   Multiple Pain Sites No            [facility] PT Assessment - 07/29/15 1827    Assessment   Medical Diagnosis right TSA   Onset Date/Surgical Date 07/14/15   Hand Dominance Right   Next MD Visit unknown   Prior Therapy none   Precautions   Precautions Shoulder  TSA   Type of Shoulder Precautions w    Shoulder Interventions Shoulder sling/immobilizer;Off for dressing/bathing/exercises   Precaution Booklet Issued No   Precaution Comments reviewed with therapist per protocol issued by surgeon   Required Braces or Orthoses Sling      Objective: Observation; patient wearing sling on arrival to physical therapy, incision well healing  Palpation: + spasms along upper trapezius, pectoral muscles, upper arm right UE PROM: right shoulder to 80 degrees with mild discomfort at end range, ER to < neutral from off trunk, better with repetition AAROM: right elbow flexion/extension, forearm supination/pronation and wrist flexion/extension all WFL's with stiffness noted, improved with repetition  Treatment: performed PROM to right shoulder per protocol within limits of 90 forward elevation, ER to neutral as tolerated, abduction to ~30 degrees, IR to abdomen, instructed in use of pulleys for home and instructed in self ROM for ER off trunk and forward elevation to 90 in supine lying with left UE assist  Patient response to treatment: improved PROM with less stiffness and verbalized and performed PROM with instruction and guidance with handout given for home.         PT Education - 07/29/15 1917    Education provided Yes   Education Details reviewed exercises and precautions for sling wearing and prorocol boundaries and precautions for 2-4 weeks post  op   Person(s) Educated Patient   Methods Explanation;Demonstration;Verbal cues;Handout   Comprehension Verbalized understanding;Returned demonstration;Verbal cues required             PT Long Term Goals - 07/29/15 1930    PT LONG TERM GOAL #1   Title Patient will demonstrate improved function with decreased pain for daily tasks as demonstrated by QuickDash score of 35%  or better by 09/03/2015   Baseline QuickDash score = 45%   Status New   PT LONG TERM GOAL #2   Title Patient will improve ROM right shoulder to 140 or better by 09/24/2015 to  improve function for shoulder level and above in order to wash hair and reach into cabinets   Baseline PROM right shoulder 80 degrees forward elevation with stiffness/pain   Status New   PT LONG TERM GOAL #3   Title Patient will demonstrate improved function with decreased pain for daily tasks as demonstrated by QuickDash score of 25%  or better by 09/24/2015   Baseline QuickDash score = 45%   Status New   PT LONG TERM GOAL #4   Title Patient will be independent in home program for pain control, strength and felxibility exercises for continued progression towards full function with right UE by 09/24/2015   Baseline Patient has limited/no knowledge of appropriate exercises or progression to improve ROM/strength in order to return to full funcitonal use  right UE   Status New               Plan - 31-Jul-2015 1920    Clinical Impression Statement Patient is a right hand dominant female who presents s/p TSA right shoulder 07/14/2015. She is limited in ROM and strength and has limited knowledge of appropriate exercises and precautions and progression of exercises. She will benefit from physical therapy 1-2x/week to improve mobility and strength in order to return to full funcitonal use of right UE without limitations.    Pt will benefit from skilled therapeutic intervention in order to improve on the following deficits Impaired flexibility;Decreased knowledge of precautions;Decreased activity tolerance;Increased muscle spasms;Impaired UE functional use;Decreased range of motion   Rehab Potential Good   Clinical Impairments Affecting Rehab Potential (+) acute condition (surgery 9//21/2016), motivated, family support (-) limited mobility with stiff shoulder   PT Frequency 2x / week   PT Duration 8 weeks   PT Treatment/Interventions Therapeutic exercise;Electrical Stimulation;Cryotherapy;Moist Heat;Passive range of motion;Scar mobilization;Patient/family education;Neuromuscular re-education;Manual  techniques   PT Next Visit Plan per protocol, manual STM, modalities for spasms/pain, ther. ex. to improve PROM   PT Home Exercise Plan pulleys, PROM instruction for shoulder forward elevation to 90, ER to neurtral off trunk (supine lying only)   Consulted and Agree with Plan of Care Patient          G-Codes - 07-31-2015 1927    Functional Assessment Tool Used Quick Dash, ROM and strength deficits, clinical judgment   Functional Limitation Carrying, moving and handling objects   Carrying, Moving and Handling Objects Current Status [phone]) At least 80 percent but less than 100 percent impaired, limited or restricted   Carrying, Moving and Handling Objects Goal Status ([id number]) At least 1 percent but less than 20 percent impaired, limited or restricted       Problem List Patient Active Problem List   Diagnosis Date Noted  . Urinary urgency 2015/07/31  . Urinary incontinence 2015/07/31  . S/P shoulder replacement 07/15/2015  . Subclinical hypothyroidism 04/20/2015  . Adjustment reaction to medical therapy 02/19/2015  . Arthritis 02/19/2015  . Body mass index 27.0-27.9, adult 02/19/2015  . Alveolar aeration decreased 02/19/2015  . Colon, diverticulosis 02/19/2015  . Gastric ulcer 02/19/2015  . High potassium 02/19/2015  . BP (high blood pressure) 02/19/2015  . Hypertriglyceridemia 02/19/2015  . Arthritis, degenerative 02/19/2015  . OP (osteoporosis) 02/19/2015  . Vitamin D deficiency 02/19/2015  . Aldosterone deficiency (HCC) 10/06/2014  . OA (osteoarthritis) of hip 08/12/2014  . Hyponatremia 11/06/2013  . Postoperative anemia due to acute blood loss 11/06/2013  . OA (osteoarthritis) of knee 10/31/2013    Beacher May PT 07/30/2015, 3:20 PM  [place] [facility] PH[facility] SPORTS MEDICINE [address] [place], [address] Phone: [phone]   Fax:  [phone]

## 2015-07-29 NOTE — Progress Notes (Signed)
Patient: [name] Female    DOB: 10-09-1934   81 y.o.   MRN: [id number] Visit Date: 07/29/2015  Today's Provider: Margaretann Loveless, PA-C   Chief Complaint  Patient presents with  . Urinary Tract Infection   Subjective:    Urinary Tract Infection  This is a new problem. The current episode started 1 to 4 weeks ago. Episode frequency: Going every 2 hours to an hour in half. The quality of the pain is described as burning. The pain is at a severity of 8/10. There has been no fever. Associated symptoms include frequency and urgency. Pertinent negatives include no chills, flank pain, hematuria, nausea or vomiting.  Has had urinary incontinence for a couple of years now. She does wear poise pads for the incontinence. She does state that she is embarrassed to go out sometimes. Gets up 2-3 times per night for nocturia. Urinary frequency increased over the last couple weeks. She then started developing burning and lower abdominal pain and felt it was finally time to come in.     Allergies  Allergen Reactions  . Fleet Liquid Glycerin [Glycerin] Other (See Comments)    Got very sick  . Morphine And Related Other (See Comments)    Makes me crazy  . Scallops [Shellfish Allergy] Nausea And Vomiting and Other (See Comments)    Able to eat shrimp and other shellfish   Previous Medications   ASCORBIC ACID (VITAMIN C) 1000 MG TABLET    Take 1,000 mg by mouth daily.   ASPIRIN EC 81 MG TABLET    Take 81 mg by mouth 2 (two) times daily.    CALCIUM CARBONATE (CALCIUM 600 PO)    Take 600 mg by mouth daily.   DIAZEPAM (VALIUM) 5 MG TABLET    Take 0.5-1 tablets (2.5-5 mg total) by mouth every 6 (six) hours as needed for muscle spasms or sedation.   DOXYLAMINE SUCCINATE, SLEEP, (SLEEP AID PO)    Take 1 tablet by mouth at bedtime.   FLUDROCORTISONE (FLORINEF) 0.1 MG TABLET    Take 0.05 mg by mouth daily.    FOLIC ACID 20 MG CAPS    Take 20 mg by mouth daily.    HYDROCHLOROTHIAZIDE  (HYDRODIURIL) 25 MG TABLET    TAKE ONE TABLET BY MOUTH ONCE DAILY   HYDROCHLOROTHIAZIDE (MICROZIDE) 12.5 MG CAPSULE    Take 12.5 mg by mouth every morning.    LATANOPROST (XALATAN) 0.005 % OPHTHALMIC SOLUTION    Place 1 drop into both eyes at bedtime.   METOPROLOL TARTRATE (LOPRESSOR) 25 MG TABLET    Take 25 mg by mouth 2 (two) times daily.   MULTIPLE VITAMIN (MULTIVITAMIN) CAPSULE    Take 1 capsule by mouth daily.    NIACIN 500 MG TABLET    Take 500 mg by mouth daily.   OMEGA-3 FATTY ACIDS (FISH OIL) 1200 MG CAPS    Take 1,200 mg by mouth daily.   OXYCODONE-ACETAMINOPHEN (PERCOCET) 5-325 MG PER TABLET    Take 1-2 tablets by mouth every 4 (four) hours as needed.   TRAMADOL (ULTRAM) 50 MG TABLET    Take 1 tablet (50 mg total) by mouth every 4 (four) hours as needed.   VITAMIN A ([employer] VITAMIN A) 10000 UNIT CAPSULE    Take 10,000 Units by mouth daily.    VITAMIN B-12 (CYANOCOBALAMIN) 1000 MCG TABLET    Take 1,000 mcg by mouth daily.    Review of Systems  Constitutional: Negative for  fever and chills.  Respiratory: Negative for chest tightness and shortness of breath.   Cardiovascular: Negative for chest pain.  Gastrointestinal: Negative for nausea, vomiting, abdominal pain, diarrhea and constipation.  Genitourinary: Positive for dysuria, urgency, frequency and enuresis. Negative for hematuria, flank pain, vaginal discharge and vaginal pain.  Neurological: Negative for dizziness, weakness and headaches.    Social History  Substance Use Topics  . Smoking status: Never Smoker   . Smokeless tobacco: Never Used  . Alcohol Use: No   Objective:   BP 128/70 mmHg  Pulse 71  Temp(Src) 97.7 F (36.5 C) (Oral)  Resp 16  Wt 156 lb 9.6 oz (71.033 kg)  Physical Exam  Constitutional: She is oriented to person, place, and time. She appears well-developed and well-nourished. No distress.  Cardiovascular: Normal rate, regular rhythm and normal heart sounds.  Exam reveals no gallop and no friction rub.    No murmur heard. Pulmonary/Chest: Effort normal and breath sounds normal. No respiratory distress. She has no wheezes. She has no rales.  Abdominal: Soft. Normal appearance and bowel sounds are normal. She exhibits no distension and no mass. There is no hepatosplenomegaly. There is tenderness in the suprapubic area. There is no rebound, no guarding and no CVA tenderness.  Suprapubic tenderness  Neurological: She is alert and oriented to person, place, and time.  Skin: Skin is warm and dry. She is not diaphoretic.        Assessment & Plan:     1. Frequency of urination UA was positive for nitrites and leukocytes. Will treat UTI. - POCT urinalysis dipstick  2. Acute cystitis without hematuria UTI treated as below. We'll send for urine culture and adjust antibiotic therapy per the culture and sensitivity results. She is to call the office if symptoms felt to improve or worsen. - sulfamethoxazole-trimethoprim (BACTRIM DS,SEPTRA DS) 800-160 MG tablet; Take 1 tablet by mouth 2 (two) times daily.  Dispense: 14 tablet; Refill: 0 - Urine culture  3. Urinary urgency Due to the increased frequency and urgency with mixed incontinence, and the increasing nocturia I did give her a sample of Vesicare 5 mg. She is to begin this medication once she completes the antibiotic therapy for the urinary tract infection as above. I will have her return in 2-3 weeks to see Dr. Elease Hashimoto, her PCP, to see how she is doing with this medication.  4. Mixed incontinence See above medical treatment plan for urinary urgency.        Margaretann Loveless, PA-C  [facility]

## 2015-07-31 LAB — URINE CULTURE

## 2015-08-02 ENCOUNTER — Telehealth: Payer: Self-pay

## 2015-08-02 ENCOUNTER — Encounter: Payer: Self-pay | Admitting: Physical Therapy

## 2015-08-02 ENCOUNTER — Ambulatory Visit: Payer: Medicare Other | Admitting: Physical Therapy

## 2015-08-02 DIAGNOSIS — M6281 Muscle weakness (generalized): Secondary | ICD-10-CM

## 2015-08-02 DIAGNOSIS — M62838 Other muscle spasm: Secondary | ICD-10-CM

## 2015-08-02 DIAGNOSIS — Z96611 Presence of right artificial shoulder joint: Secondary | ICD-10-CM

## 2015-08-02 NOTE — Telephone Encounter (Signed)
-----   Message from Margaretann Loveless, [place] sent at 08/02/2015  9:37 AM EDT ----- Urine culture was positive and bacteria is susceptible to the antibiotic she is on.  Continue until completed.  Call if symptoms do not improve after finishing antibiotic.

## 2015-08-02 NOTE — Telephone Encounter (Signed)
Advised pt as directed. Pt verbalized fully understanding.  Thanks.

## 2015-08-02 NOTE — Telephone Encounter (Signed)
Left message to call back.  Thanks, 

## 2015-08-02 NOTE — Therapy (Signed)
[facility] [address], [place], [address] Phone: [phone]   Fax:  [phone]  Physical Therapy Treatment  Patient Details  Name: [name] MRN: [id number] Date of Birth: Oct 04, 1934 Referring Provider:  Francena Hanly, MD  Encounter Date: 08/02/2015      PT End of Session - 08/02/15 0947    Visit Number 2   Number of Visits 16   Date for PT Re-Evaluation 09/24/15   Authorization Type 2   Authorization Time Period 10   PT Start Time 0916   PT Stop Time 0945   PT Time Calculation (min) 29 min   Activity Tolerance Patient tolerated treatment well   Behavior During Therapy [facility] for tasks assessed/performed      Past Medical History  Diagnosis Date  . PONV (postoperative nausea and vomiting)     severe  . Peptic ulcer disease with hemorrhage 10/13/2008    admitted to hospital .had 4 blood transfusions  . Hypertension   . Neuromuscular disorder (HCC)     left knee  . History of blood transfusion 2009  . Arthritis     "shoulders, knees, right thumb" (08/15/2012)  . Hypothyroidism     not on meds at present. MD watching  . Anxiety   . Depression   . Chronic kidney disease   . Incontinence     Past Surgical History  Procedure Laterality Date  . Tonsillectomy and adenoidectomy  ~ 1945  . Nasal polyp excision  ~ 1946  . Shoulder surgery Left ~ 2003    "bone spur removed"  . Reverse shoulder arthroplasty  08/15/2012    Procedure: REVERSE SHOULDER ARTHROPLASTY;  Surgeon: Senaida Lange, MD;  [facility]: [facility];  Service: Orthopedics;  Laterality: Left;  . Total knee arthroplasty Right 10/31/2013    Procedure: RIGHT TOTAL KNEE ARTHROPLASTY;  Surgeon: Loanne Drilling, MD;  [facility];  Service: Orthopedics;  Laterality: Right;  . Injection knee Left 10/31/2013    Procedure: CORTIZONE INJECTION LEFT KNEE ;  Surgeon: Loanne Drilling, MD;  [facility];  Service: Orthopedics;  Laterality:  Left;  injected at 1624 under anesthesia  . Total hip arthroplasty Left 08/12/2014    Procedure: LEFT TOTAL HIP ARTHROPLASTY ANTERIOR APPROACH;  Surgeon: Loanne Drilling, MD;  Location: [facility];  Service: Orthopedics;  Laterality: Left;  . Cataract extraction w/ intraocular lens  implant, bilateral Bilateral 3-01/2015  . Joint replacement    . Total shoulder arthroplasty Right 07/15/2015  . Total shoulder arthroplasty Right 07/15/2015    Procedure: RIGHT TOTAL SHOULDER ARTHROPLASTY;  Surgeon: Francena Hanly, MD;  [facility]: [facility];  Service: Orthopedics;  Laterality: Right;    There were no vitals filed for this visit.  Visit Diagnosis:  Muscle weakness of right upper extremity  Status post total shoulder replacement, right  Spasm of muscle      Subjective Assessment - 08/02/15 0925    Subjective Patient reports she is having some aching in her right arm. She is exercising at home as instructed.    Patient Stated Goals To be able to use right arm for personal care and household chores without stiffness or difficulty   Currently in Pain? Yes   Pain Score 2    Pain Location Shoulder   Pain Orientation Right   Pain Descriptors / Indicators Aching   Pain Type Surgical pain   Pain Onset 1 to 4 weeks ago   Pain Frequency Intermittent  Multiple Pain Sites No      Objective: Observation: patient in sling right UE on arrival Palpation: increased spasms along right shoulder/pectoral region and upper arm and upper trapezius       [facility] Adult PT Treatment/Exercise - 08/02/15 0927    Exercises   Exercises Other Exercises   Other Exercises  PROM/AAROM right shoulder with patient supine lying: performed PROM/AAROM to right shoulder per protocol within limits of 90 forward elevation, ER to neutral as tolerated, abduction to ~30 degrees, IR to abdomen x 3 sets as tolerated with verbal cues and tactile cues to not use excessive effort to keep shoulder in alignment during entire ROM      STM  performed by PT to right upper arm/pectoral muscles and upper trapezius in conjunction with exercises to decrease spasms and pain and allow improved mobility in shoulder for exercises   Patient response to treatment: improved PROM to neurtral ER, 90 degrees elevation with less stiffness and able to perform exercises with decreased excessive effort noted with repetition and verbal cues          PT Long Term Goals - 07/29/15 1930    PT LONG TERM GOAL #1   Title Patient will demonstrate imrpoved function with decreased pain for daily tasks as demonstrated by QuickDash score of 35%  or better by 09/03/2015   Baseline QuickDash score = 45%   Status New   PT LONG TERM GOAL #2   Title Patient will imrpove ROM right shoulder to 140 or better by 09/24/2015 to improve function for shoulder level and above in order to wash hair and reach into cabinets   Baseline PROM right shoulder 80 degrees forward elevation with stiffness/pain   Status New   PT LONG TERM GOAL #3   Title Patient will demonstrate imrpoved function with decreased pain for daily tasks as demonstrated by QuickDash score of 25%  or better by 09/24/2015   Baseline QuickDash score = 45%   Status New   PT LONG TERM GOAL #4   Title Patient will be independent in home program for pain control, strength and felxibility exercises for continued progression towards full function with right UE by 09/24/2015   Baseline Patient has limited/no knowledge of appropriate exercises or progression to improve ROM/strength in order to return to full funcitonal use right UE   Status New               Plan - 08/02/15 0948    Clinical Impression Statement Patient demonstrated improved ablity to perform PROM/AAROM right shoulder with less discomfort than previous session and improved ROM to 90 degrees forward elevation and neutral ER. She continues to require verbal and tactile cues to perform exercises with correct positioning and through correct  ROM per protocol    Pt will benefit from skilled therapeutic intervention in order to improve on the following deficits Impaired flexibility;Decreased knowledge of precautions;Decreased activity tolerance;Increased muscle spasms;Impaired UE functional use;Decreased range of motion   Rehab Potential Fair   PT Frequency 2x / week   PT Duration 8 weeks   PT Treatment/Interventions Therapeutic exercise;Electrical Stimulation;Cryotherapy;Moist Heat;Passive range of motion;Scar mobilization;Patient/family education;Neuromuscular re-education;Manual techniques   PT Next Visit Plan per protocol, manual STM, modalities for spasms/pain, ther. ex. to improve PROM        Problem List Patient Active Problem List   Diagnosis Date Noted  . Urinary urgency 07/29/2015  . Urinary incontinence 07/29/2015  . S/P shoulder replacement 07/15/2015  . Subclinical hypothyroidism 04/20/2015  .  Adjustment reaction to medical therapy 02/19/2015  . Arthritis 02/19/2015  . Body mass index 27.0-27.9, adult 02/19/2015  . Alveolar aeration decreased 02/19/2015  . Colon, diverticulosis 02/19/2015  . Gastric ulcer 02/19/2015  . High potassium 02/19/2015  . BP (high blood pressure) 02/19/2015  . Hypertriglyceridemia 02/19/2015  . Arthritis, degenerative 02/19/2015  . OP (osteoporosis) 02/19/2015  . Vitamin D deficiency 02/19/2015  . Aldosterone deficiency (HCC) 10/06/2014  . OA (osteoarthritis) of hip 08/12/2014  . Hyponatremia 11/06/2013  . Postoperative anemia due to acute blood loss 11/06/2013  . OA (osteoarthritis) of knee 10/31/2013    Beacher May PT 08/02/2015, 5:41 PM  [facility] SPORTS MEDICINE [address] [place], [address] Phone: [phone]   Fax:  [phone]

## 2015-08-04 ENCOUNTER — Encounter: Payer: Self-pay | Admitting: Physical Therapy

## 2015-08-04 ENCOUNTER — Ambulatory Visit: Payer: Medicare Other | Admitting: Physical Therapy

## 2015-08-04 DIAGNOSIS — M62838 Other muscle spasm: Secondary | ICD-10-CM

## 2015-08-04 DIAGNOSIS — M6281 Muscle weakness (generalized): Secondary | ICD-10-CM | POA: Diagnosis not present

## 2015-08-04 DIAGNOSIS — Z96611 Presence of right artificial shoulder joint: Secondary | ICD-10-CM

## 2015-08-04 NOTE — Therapy (Signed)
[facility]ICINE [address], [place], [address] Phone: [phone]   Fax:  [phone]  Physical Therapy Treatment  Patient Details  Name: [name] MRN: [id number] Date of Birth: [birth year] Referring Provider:  Francena Hanly, MD  Encounter Date: 08/04/2015      PT End of Session - 08/04/15 1622    Visit Number 3   Number of Visits 16   Date for PT Re-Evaluation 09/24/15   Authorization Type 3   Authorization Time Period 10   PT Start Time 1538   PT Stop Time 1613   PT Time Calculation (min) 35 min   Activity Tolerance Patient tolerated treatment well   Behavior During Therapy [facility] for tasks assessed/performed      Past Medical History  Diagnosis Date  . PONV (postoperative nausea and vomiting)     severe  . Peptic ulcer disease with hemorrhage 10/13/2008    admitted to hospital .had 4 blood transfusions  . Hypertension   . Neuromuscular disorder (HCC)     left knee  . History of blood transfusion 2009  . Arthritis     "shoulders, knees, right thumb" (08/15/2012)  . Hypothyroidism     not on meds at present. MD watching  . Anxiety   . Depression   . Chronic kidney disease   . Incontinence     Past Surgical History  Procedure Laterality Date  . Tonsillectomy and adenoidectomy  ~ 1945  . Nasal polyp excision  ~ 1946  . Shoulder surgery Left ~ 2003    "bone spur removed"  . Reverse shoulder arthroplasty  08/15/2012    Procedure: REVERSE SHOULDER ARTHROPLASTY;  Surgeon: Senaida Lange, MD;  [facility]: [facility];  Service: Orthopedics;  Laterality: Left;  . Total knee arthroplasty Right 10/31/2013    Procedure: RIGHT TOTAL KNEE ARTHROPLASTY;  Surgeon: Loanne Drilling, MD;  Location: [facility];  Service: Orthopedics;  Laterality: Right;  . Injection knee Left 10/31/2013    Procedure: CORTIZONE INJECTION LEFT KNEE ;  Surgeon: Loanne Drilling, MD;  [facility]: [facility];  Service: Orthopedics;  Laterality:  Left;  injected at 1624 under anesthesia  . Total hip arthroplasty Left 08/12/2014    Procedure: LEFT TOTAL HIP ARTHROPLASTY ANTERIOR APPROACH;  Surgeon: Loanne Drilling, MD;  [facility]: [facility];  Service: Orthopedics;  Laterality: Left;  . Cataract extraction w/ intraocular lens  implant, bilateral Bilateral 3-01/2015  . Joint replacement    . Total shoulder arthroplasty Right 07/15/2015  . Total shoulder arthroplasty Right 07/15/2015    Procedure: RIGHT TOTAL SHOULDER ARTHROPLASTY;  Surgeon: Francena Hanly, MD;  [facility];  Service: Orthopedics;  Laterality: Right;    There were no vitals filed for this visit.  Visit Diagnosis:  Muscle weakness of right upper extremity  Status post total shoulder replacement, right  Spasm of muscle      Subjective Assessment - 08/04/15 1545    Subjective Patient reports she is having some aching in her right arm. She is exercising at home as instructed.    Patient Stated Goals To be able to use right arm for personal care and household chores without stiffness or difficulty   Currently in Pain? Yes   Pain Score 2    Pain Location Shoulder   Pain Orientation Right   Pain Descriptors / Indicators Aching   Pain Type Surgical pain   Pain Onset 1 to 4 weeks ago   Pain Frequency Intermittent  Objective: Observation: patient arrived in clinic without sling in use today Palpation: + spasms and decreased soft tissue elasticity right shoulder:pectoral muscles, posterior aspect of shoulder and right upper trapezius        [facility] Adult PT Treatment/Exercise - 08/04/15 1546    Exercises   Exercises Other Exercises   Other Exercises  PROM/AAROM right shoulder with patient supine lying: forward elevation, ER, abduction within protocol guidelines with therapist guidance and assistance to perform with proper alignment of shoulder     STM performed by therapist to right shoulder, upper trapezius, upper arm and pectoral muscles in conjunction with ROM  exercises    Patient response to treatment: improved soft tissue elasticity with improved ability to perform exercises with less stiffness and improved ROM to 90 degrees +, ER to neutral with less difficulty           PT Education - 08/04/15 1610    Education provided Yes   Education Details re inforced precautions and exercises to be performed at home   Person(s) Educated Patient   Methods Explanation;Demonstration;Verbal cues   Comprehension Verbalized understanding;Returned demonstration;Verbal cues required             PT Long Term Goals - 07/29/15 1930    PT LONG TERM GOAL #1   Title Patient will demonstrate imrpoved function with decreased pain for daily tasks as demonstrated by QuickDash score of 35%  or better by 09/03/2015   Baseline QuickDash score = 45%   Status New   PT LONG TERM GOAL #2   Title Patient will imrpove ROM right shoulder to 140 or better by 09/24/2015 to improve function for shoulder level and above in order to wash hair and reach into cabinets   Baseline PROM right shoulder 80 degrees forward elevation with stiffness/pain   Status New   PT LONG TERM GOAL #3   Title Patient will demonstrate imrpoved function with decreased pain for daily tasks as demonstrated by QuickDash score of 25%  or better by 09/24/2015   Baseline QuickDash score = 45%   Status New   PT LONG TERM GOAL #4   Title Patient will be independent in home program for pain control, strength and felxibility exercises for continued progression towards full function with right UE by 09/24/2015   Baseline Patient has limited/no knowledge of appropriate exercises or progression to improve ROM/strength in order to return to full funcitonal use right UE   Status New               Plan - 08/04/15 1923    Clinical Impression Statement Patient demonstrated improved soft tissue elasticity which allowed improved abiltiy to perform ROM with less difficulty and pain. She is progressing  with improved ROM to 90+ degrees of forward elevation and ER to neutral. She requires verbal and tactile cues to perform exercises with relaxed posture and correct positioning.    Pt will benefit from skilled therapeutic intervention in order to improve on the following deficits Impaired flexibility;Decreased knowledge of precautions;Decreased activity tolerance;Increased muscle spasms;Impaired UE functional use;Decreased range of motion   Rehab Potential Fair   PT Frequency 2x / week   PT Duration 8 weeks   PT Treatment/Interventions Therapeutic exercise;Electrical Stimulation;Cryotherapy;Moist Heat;Passive range of motion;Scar mobilization;Patient/family education;Neuromuscular re-education;Manual techniques   PT Next Visit Plan per protocol, manual STM, modalities for spasms/pain, ther. ex. to improve PROM        Problem List Patient Active Problem List   Diagnosis Date Noted  . Urinary  urgency 07/29/2015  . Urinary incontinence 07/29/2015  . S/P shoulder replacement 07/15/2015  . Subclinical hypothyroidism 04/20/2015  . Adjustment reaction to medical therapy 02/19/2015  . Arthritis 02/19/2015  . Body mass index 27.0-27.9, adult 02/19/2015  . Alveolar aeration decreased 02/19/2015  . Colon, diverticulosis 02/19/2015  . Gastric ulcer 02/19/2015  . High potassium 02/19/2015  . BP (high blood pressure) 02/19/2015  . Hypertriglyceridemia 02/19/2015  . Arthritis, degenerative 02/19/2015  . OP (osteoporosis) 02/19/2015  . Vitamin D deficiency 02/19/2015  . Aldosterone deficiency (HCC) 10/06/2014  . OA (osteoarthritis) of hip 08/12/2014  . Hyponatremia 11/06/2013  . Postoperative anemia due to acute blood loss 11/06/2013  . OA (osteoarthritis) of knee 10/31/2013    Beacher MayBrooks, Cathern Tahir PT 08/04/2015, 7:26 PM  [facility] [address] [place], [place], [address] Phone: [phone]   Fax:  [phone]

## 2015-08-09 ENCOUNTER — Encounter: Payer: Medicare Other | Admitting: Physical Therapy

## 2015-08-10 ENCOUNTER — Encounter: Payer: Self-pay | Admitting: Physical Therapy

## 2015-08-10 ENCOUNTER — Ambulatory Visit: Payer: Medicare Other | Admitting: Physical Therapy

## 2015-08-10 ENCOUNTER — Encounter: Payer: Medicare Other | Admitting: Physical Therapy

## 2015-08-10 DIAGNOSIS — M6281 Muscle weakness (generalized): Secondary | ICD-10-CM

## 2015-08-10 DIAGNOSIS — M62838 Other muscle spasm: Secondary | ICD-10-CM

## 2015-08-10 DIAGNOSIS — Z96611 Presence of right artificial shoulder joint: Secondary | ICD-10-CM

## 2015-08-10 NOTE — Therapy (Signed)
[place] [facility] [address] [place], [place], [address] Phone: [phone]   Fax:  [phone]  Physical Therapy Treatment  Patient Details  Name: Regis BillDarlene J Condie MRN: [id number] Date of Birth: 02-23-1934 No Data Recorded  Encounter Date: 08/10/2015      PT End of Session - 08/10/15 0932    Visit Number 4   Number of Visits 16   Date for PT Re-Evaluation 09/24/15   Authorization Type 3   Authorization Time Period 10   PT Start Time 0927   PT Stop Time 1004   PT Time Calculation (min) 37 min   Activity Tolerance Patient tolerated treatment well   Behavior [facility] for tasks assessed/performed      Past Medical History  Diagnosis Date  . PONV (postoperative nausea and vomiting)     severe  . Peptic ulcer disease with hemorrhage 10/13/2008    admitted to hospital .had 4 blood transfusions  . Hypertension   . Neuromuscular disorder (HCC)     left knee  . History of blood transfusion 2009  . Arthritis     "shoulders, knees, right thumb" (08/15/2012)  . Hypothyroidism     not on meds at present. MD watching  . Anxiety   . Depression   . Chronic kidney disease   . Incontinence     Past Surgical History  Procedure Laterality Date  . Tonsillectomy and adenoidectomy  ~ 1945  . Nasal polyp excision  ~ 1946  . Shoulder surgery Left ~ 2003    "bone spur removed"  . Reverse shoulder arthroplasty  08/15/2012    Procedure: REVERSE SHOULDER ARTHROPLASTY;  Surgeon: Senaida LangeKevin M Supple, MD;  [facility];  Service: Orthopedics;  Laterality: Left;  . Total knee arthroplasty Right 10/31/2013    Procedure: RIGHT TOTAL KNEE ARTHROPLASTY;  Surgeon: Loanne DrillingFrank V Aluisio, MD;  [facility];  Service: Orthopedics;  Laterality: Right;  . Injection knee Left 10/31/2013    Procedure: CORTIZONE INJECTION LEFT KNEE ;  Surgeon: Loanne DrillingFrank V Aluisio, MD;  [facility];  Service: Orthopedics;  Laterality: Left;  injected at 1624  under anesthesia  . Total hip arthroplasty Left 08/12/2014    Procedure: LEFT TOTAL HIP ARTHROPLASTY ANTERIOR APPROACH;  Surgeon: Loanne DrillingFrank Aluisio V, MD;  [facility];  Service: Orthopedics;  Laterality: Left;  . Cataract extraction w/ intraocular lens  implant, bilateral Bilateral 3-01/2015  . Joint replacement    . Total shoulder arthroplasty Right 07/15/2015  . Total shoulder arthroplasty Right 07/15/2015    Procedure: RIGHT TOTAL SHOULDER ARTHROPLASTY;  Surgeon: Francena HanlyKevin Supple, MD;  [facility];  Service: Orthopedics;  Laterality: Right;    There were no vitals filed for this visit.  Visit Diagnosis:  Muscle weakness of right upper extremity  Status post total shoulder replacement, right  Spasm of muscle      Subjective Assessment - 08/10/15 0927    Subjective Patient reports she is doing well and is not having any problems. She feels she has felt a big difference with pain since Friday her 3 week mark.    Patient Stated Goals To be able to use right arm for personal care and household chores without stiffness or difficulty   Currently in Pain? No/denies      Objective:  Palpation: + spasms and decreased soft tissue elasticity along pectoral muscles, right upper arm, and upper trapezius AAROM: right shoulder flexion 90 degrees, ER 0 degrees on arrival  [facility] Adult PT Treatment/Exercise - 08/10/15 0932    Exercises   Exercises Other Exercises   Other Exercises  PROM/AAROM right shoulder with patient supine lying: forward elevation, ER, abduction within protocol guidelines with therapist guidance and assistance to perform with proper alignment of shoulder: 3 sets of each, re instructed in proper technique for AAROM forward elevation and ER Performed isometric ER, abduction and RS forward elevation at 90 degrees, requiring contact guard assist to maintain proper alignment     STM performed to right shoulder/pectoral, upper arm and upper trapezius muscles with  patient supine lying in conjunction with exercise  Patient response to treatment: improved flexibility into forward elevation, ER by 10 degrees each, required verbal cues and tactile cues to perform all exercises            PT Education - 08/10/15 2204    Education provided Yes   Education Details re instructed in [facility] for right shoulder with tactile and verbal cuing for forward elevation and ER   Person(s) Educated Patient   Methods Explanation;Demonstration;Tactile cues;Verbal cues   Comprehension Verbalized understanding;Returned demonstration;Verbal cues required;Tactile cues required             PT Long Term Goals - 07/29/15 1930    PT LONG TERM GOAL #1   Title Patient will demonstrate imrpoved function with decreased pain for daily tasks as demonstrated by QuickDash score of 35%  or better by 09/03/2015   Baseline QuickDash score = 45%   Status New   PT LONG TERM GOAL #2   Title Patient will imrpove ROM right shoulder to 140 or better by 09/24/2015 to improve function for shoulder level and above in order to wash hair and reach into cabinets   Baseline PROM right shoulder 80 degrees forward elevation with stiffness/pain   Status New   PT LONG TERM GOAL #3   Title Patient will demonstrate imrpoved function with decreased pain for daily tasks as demonstrated by QuickDash score of 25%  or better by 09/24/2015   Baseline QuickDash score = 45%   Status New   PT LONG TERM GOAL #4   Title Patient will be independent in home program for pain control, strength and felxibility exercises for continued progression towards full function with right UE by 09/24/2015   Baseline Patient has limited/no knowledge of appropriate exercises or progression to improve ROM/strength in order to return to full funcitonal use right UE   Status New               Plan - 08/10/15 2209    Clinical Impression Statement Patient continues with significant stifness in her right shoulder  flexion and ER. She improved AAROM right shoulder to 100 degrees. ER continues with limited ROM to 10 detrees. Her spasms and soft tissue demonstrate improvement with physical therapy intervention.    Pt will benefit from skilled therapeutic intervention in order to improve on the following deficits Impaired flexibility;Decreased knowledge of precautions;Decreased activity tolerance;Increased muscle spasms;Impaired UE functional use;Decreased range of motion   PT Frequency 2x / week   PT Duration 8 weeks   PT Treatment/Interventions Therapeutic exercise;Electrical Stimulation;Cryotherapy;Moist Heat;Passive range of motion;Scar mobilization;Patient/family education;Neuromuscular re-education;Manual techniques   PT Next Visit Plan per protocol, manual STM, modalities for spasms/pain, ther. ex. to improve PROM        Problem List Patient Active Problem List   Diagnosis Date Noted  . Urinary urgency 07/29/2015  . Urinary incontinence 07/29/2015  . S/P shoulder replacement 07/15/2015  .  Subclinical hypothyroidism 04/20/2015  . Adjustment reaction to medical therapy 02/19/2015  . Arthritis 02/19/2015  . Body mass index 27.0-27.9, adult 02/19/2015  . Alveolar aeration decreased 02/19/2015  . Colon, diverticulosis 02/19/2015  . Gastric ulcer 02/19/2015  . High potassium 02/19/2015  . BP (high blood pressure) 02/19/2015  . Hypertriglyceridemia 02/19/2015  . Arthritis, degenerative 02/19/2015  . OP (osteoporosis) 02/19/2015  . Vitamin D deficiency 02/19/2015  . Aldosterone deficiency (HCC) 10/06/2014  . OA (osteoarthritis) of hip 08/12/2014  . Hyponatremia 11/06/2013  . Postoperative anemia due to acute blood loss 11/06/2013  . OA (osteoarthritis) of knee 10/31/2013    Beacher May PT 08/10/2015, 10:16 PM  [place] [facility] PHYSICAL AND SPORTS MEDICINE [address] [place], [address] Phone: [phone]   Fax:  [phone]  Name: DICKIE LABARRE MRN: [id number] Date of Birth: 05/24/1934

## 2015-08-11 ENCOUNTER — Encounter: Payer: Medicare Other | Admitting: Physical Therapy

## 2015-08-12 ENCOUNTER — Encounter: Payer: Self-pay | Admitting: Family Medicine

## 2015-08-12 ENCOUNTER — Ambulatory Visit (INDEPENDENT_AMBULATORY_CARE_PROVIDER_SITE_OTHER): Payer: Medicare Other | Admitting: Family Medicine

## 2015-08-12 VITALS — BP 122/70 | HR 68 | Temp 98.1°F | Resp 16 | Wt 151.0 lb

## 2015-08-12 DIAGNOSIS — B964 Proteus (mirabilis) (morganii) as the cause of diseases classified elsewhere: Secondary | ICD-10-CM

## 2015-08-12 DIAGNOSIS — N390 Urinary tract infection, site not specified: Secondary | ICD-10-CM | POA: Diagnosis not present

## 2015-08-12 LAB — POCT URINALYSIS DIPSTICK
Bilirubin, UA: NEGATIVE
Blood, UA: NEGATIVE
GLUCOSE UA: NEGATIVE
KETONES UA: NEGATIVE
LEUKOCYTES UA: NEGATIVE
Nitrite, UA: NEGATIVE
SPEC GRAV UA: 1.025
Urobilinogen, UA: 0.2
pH, UA: 6

## 2015-08-12 NOTE — Progress Notes (Signed)
Subjective:    Patient ID: [name], female    DOB: 1933/12/10, 81 y.o.   MRN: [id number]  HPI  Urinary Urgency Pt was seen in office 2 weeks ago by Antony Contras. Pt was treated for UTI with Bactrim (Urine Cx showed bacteria was susceptible to this medication). Antony Contras also put pt on Vesicare 5 mg for urinary urgency, which was to be started after antibiotics. Pt reports that the Vesicare caused pt to be "drowsy; I wanted to sleep all day". Pt is no longer taking the medication, but reports her sx have improved. Denies urgency, incontinence, frequency, dysuria. Pt does admit she wakes during the night to use the bathroom, but only once.  Back to normal. Not an urgent feeling.   Does not want further medication or referral.     Review of Systems  Constitutional: Positive for activity change (due to shoulder pain) and fatigue. Negative for fever, chills, diaphoresis, appetite change and unexpected weight change.  HENT: Positive for sneezing.   Respiratory: Negative for cough, shortness of breath and wheezing.   Cardiovascular: Negative for chest pain, palpitations and leg swelling.  Genitourinary: Negative for dysuria, urgency, frequency, flank pain and difficulty urinating.  Musculoskeletal: Positive for arthralgias.  Allergic/Immunologic: Positive for environmental allergies.   BP 122/70 mmHg  Pulse 68  Temp(Src) 98.1 F (36.7 C) (Oral)  Resp 16  Wt 151 lb (68.493 kg)   Patient Active Problem List   Diagnosis Date Noted  . Urinary urgency 07/29/2015  . Urinary incontinence 07/29/2015  . S/P shoulder replacement 07/15/2015  . Subclinical hypothyroidism 04/20/2015  . Adjustment reaction to medical therapy 02/19/2015  . Arthritis 02/19/2015  . Body mass index 27.0-27.9, adult 02/19/2015  . Alveolar aeration decreased 02/19/2015  . Colon, diverticulosis 02/19/2015  . Gastric ulcer 02/19/2015  . High potassium 02/19/2015  . BP (high blood pressure) 02/19/2015  .  Hypertriglyceridemia 02/19/2015  . Arthritis, degenerative 02/19/2015  . OP (osteoporosis) 02/19/2015  . Vitamin D deficiency 02/19/2015  . Aldosterone deficiency (HCC) 10/06/2014  . OA (osteoarthritis) of hip 08/12/2014  . Hyponatremia 11/06/2013  . Postoperative anemia due to acute blood loss 11/06/2013  . OA (osteoarthritis) of knee 10/31/2013   Past Medical History  Diagnosis Date  . PONV (postoperative nausea and vomiting)     severe  . Peptic ulcer disease with hemorrhage 10/13/2008    admitted to hospital .had 4 blood transfusions  . Hypertension   . Neuromuscular disorder (HCC)     left knee  . History of blood transfusion 2009  . Arthritis     "shoulders, knees, right thumb" (08/15/2012)  . Hypothyroidism     not on meds at present. MD watching  . Anxiety   . Depression   . Chronic kidney disease   . Incontinence    Current Outpatient Prescriptions on File Prior to Visit  Medication Sig  . Ascorbic Acid (VITAMIN C) 1000 MG tablet Take 1,000 mg by mouth daily.  Marland Kitchen aspirin EC 81 MG tablet Take 81 mg by mouth 2 (two) times daily.   . Calcium Carbonate (CALCIUM 600 PO) Take 600 mg by mouth daily.  . Doxylamine Succinate, Sleep, (SLEEP AID PO) Take 1 tablet by mouth at bedtime.  . Folic Acid 20 MG CAPS Take 20 mg by mouth daily.   . hydrochlorothiazide (HYDRODIURIL) 25 MG tablet TAKE ONE TABLET BY MOUTH ONCE DAILY  . hydrochlorothiazide (MICROZIDE) 12.5 MG capsule Take 12.5 mg by mouth every morning.   . latanoprost (XALATAN)  0.005 % ophthalmic solution Place 1 drop into both eyes at bedtime.  . metoprolol tartrate (LOPRESSOR) 25 MG tablet Take 25 mg by mouth 2 (two) times daily.  . Multiple Vitamin (MULTIVITAMIN) capsule Take 1 capsule by mouth daily.   . niacin 500 MG tablet Take 500 mg by mouth daily.  . Omega-3 Fatty Acids (FISH OIL) 1200 MG CAPS Take 1,200 mg by mouth daily.  . traMADol (ULTRAM) 50 MG tablet Take 1 tablet (50 mg total) by mouth every 4 (four) hours  as needed.  . vitamin A (CVS VITAMIN A) 10000 UNIT capsule Take 10,000 Units by mouth daily.   . vitamin B-12 (CYANOCOBALAMIN) 1000 MCG tablet Take 1,000 mcg by mouth daily.   Current Facility-Administered Medications on File Prior to Visit  Medication  . tranexamic acid (CYKLOKAPRON) 2,000 mg in sodium chloride 0.9 % 50 mL Topical Application   Allergies  Allergen Reactions  . Fleet Liquid Glycerin [Glycerin] Other (See Comments)    Got very sick  . Morphine And Related Other (See Comments)    Makes me crazy  . Scallops [Shellfish Allergy] Nausea And Vomiting and Other (See Comments)    Able to eat shrimp and other shellfish   Past Surgical History  Procedure Laterality Date  . Tonsillectomy and adenoidectomy  ~ 1945  . Nasal polyp excision  ~ 1946  . Shoulder surgery Left ~ 2003    "bone spur removed"  . Reverse shoulder arthroplasty  08/15/2012    Procedure: REVERSE SHOULDER ARTHROPLASTY;  Surgeon: Senaida Lange, MD;  [facility]: [facility];  Service: Orthopedics;  Laterality: Left;  . Total knee arthroplasty Right 10/31/2013    Procedure: RIGHT TOTAL KNEE ARTHROPLASTY;  Surgeon: Loanne Drilling, MD;  [facility]: [facility];  Service: Orthopedics;  Laterality: Right;  . Injection knee Left 10/31/2013    Procedure: CORTIZONE INJECTION LEFT KNEE ;  Surgeon: Loanne Drilling, MD;  Location: [facility];  Service: Orthopedics;  Laterality: Left;  injected at 1624 under anesthesia  . Total hip arthroplasty Left 08/12/2014    Procedure: LEFT TOTAL HIP ARTHROPLASTY ANTERIOR APPROACH;  Surgeon: Loanne Drilling, MD;  Location: [facility];  Service: Orthopedics;  Laterality: Left;  . Cataract extraction w/ intraocular lens  implant, bilateral Bilateral 3-01/2015  . Joint replacement    . Total shoulder arthroplasty Right 07/15/2015  . Total shoulder arthroplasty Right 07/15/2015    Procedure: RIGHT TOTAL SHOULDER ARTHROPLASTY;  Surgeon: Francena Hanly, MD;  Location: [facility];  Service: Orthopedics;  Laterality: Right;    Social History   Social History  . Marital Status: Married    Spouse Name: N/A  . Number of Children: N/A  . Years of Education: N/A   Occupational History  . Not on file.   Social History Main Topics  . Smoking status: Never Smoker   . Smokeless tobacco: Never Used  . Alcohol Use: Yes     Comment: very rarely  . Drug Use: No  . Sexual Activity: Not Currently   Other Topics Concern  . Not on file   Social History Narrative   Family History  Problem Relation Age of Onset  . Arthritis Mother   . Hypertension Mother   . Transient ischemic attack Mother   . Alcohol abuse Father   . Hypertension Brother   . Stroke Brother   . Prostate cancer Brother   . Diabetes Son   . CAD Brother       Objective:   Physical Exam  Constitutional: She is oriented to person, place, and time. She appears well-developed and well-nourished.  Neurological: She is alert and oriented to person, place, and time.  Psychiatric: She has a normal mood and affect. Her behavior is normal. Judgment and thought content normal.   BP 122/70 mmHg  Pulse 68  Temp(Src) 98.1 F (36.7 C) (Oral)  Resp 16  Wt 151 lb (68.493 kg)      Assessment & Plan:  1. Urinary tract infection due to Proteus Stable.   - POCT urinalysis dipstick . Results for orders placed or performed in visit on 08/12/15  POCT urinalysis dipstick  Result Value Ref Range   Color, UA yellow    Clarity, UA clear    Glucose, UA neg    Bilirubin, UA neg    Ketones, UA neg    Spec Grav, UA 1.025    Blood, UA neg    pH, UA 6.0    Protein, UA trace    Urobilinogen, UA 0.2    Nitrite, UA neg    Leukocytes, UA Negative Negative   Lorie PhenixNancy Kamaree Berkel, MD

## 2015-08-13 ENCOUNTER — Ambulatory Visit: Payer: Medicare Other | Admitting: Physical Therapy

## 2015-08-13 ENCOUNTER — Encounter: Payer: Self-pay | Admitting: Physical Therapy

## 2015-08-13 DIAGNOSIS — Z96611 Presence of right artificial shoulder joint: Secondary | ICD-10-CM

## 2015-08-13 DIAGNOSIS — M6281 Muscle weakness (generalized): Secondary | ICD-10-CM | POA: Diagnosis not present

## 2015-08-13 DIAGNOSIS — M62838 Other muscle spasm: Secondary | ICD-10-CM

## 2015-08-13 NOTE — Therapy (Signed)
[place] [facility] PH[facility] AND SPORTS MEDICINE [address] [place], [place], [address] Phone: [phone]   Fax:  [phone]  Physical Therapy Treatment  Patient Details  Name: Regis BillDarlene J Peregoy MRN: [id number] Date of Birth: 1933/12/06 Referring Provider: Francena HanlySupple, Kevin, MD  Encounter Date: 08/13/2015      PT End of Session - 08/13/15 0916    Visit Number 5   Number of Visits 16   Date for PT Re-Evaluation 09/24/15   Authorization Type 5   Authorization Time Period 10   PT Start Time 0913   PT Stop Time 0943   PT Time Calculation (min) 30 min   Activity Tolerance Patient tolerated treatment well   Behavior During Therapy [facility] for tasks assessed/performed      Past Medical History  Diagnosis Date  . PONV (postoperative nausea and vomiting)     severe  . Peptic ulcer disease with hemorrhage 10/13/2008    admitted to hospital .had 4 blood transfusions  . Hypertension   . Neuromuscular disorder (HCC)     left knee  . History of blood transfusion 2009  . Arthritis     "shoulders, knees, right thumb" (08/15/2012)  . Hypothyroidism     not on meds at present. MD watching  . Anxiety   . Depression   . Chronic kidney disease   . Incontinence     Past Surgical History  Procedure Laterality Date  . Tonsillectomy and adenoidectomy  ~ 1945  . Nasal polyp excision  ~ 1946  . Shoulder surgery Left ~ 2003    "bone spur removed"  . Reverse shoulder arthroplasty  08/15/2012    Procedure: REVERSE SHOULDER ARTHROPLASTY;  Surgeon: Senaida LangeKevin M Supple, MD;  Location: [facility];  Service: Orthopedics;  Laterality: Left;  . Total knee arthroplasty Right 10/31/2013    Procedure: RIGHT TOTAL KNEE ARTHROPLASTY;  Surgeon: Loanne DrillingFrank V Aluisio, MD;  Location: [facility];  Service: Orthopedics;  Laterality: Right;  . Injection knee Left 10/31/2013    Procedure: CORTIZONE INJECTION LEFT KNEE ;  Surgeon: Loanne DrillingFrank V Aluisio, MD;  Location: [facility];  Service: Orthopedics;  Laterality:  Left;  injected at 1624 under anesthesia  . Total hip arthroplasty Left 08/12/2014    Procedure: LEFT TOTAL HIP ARTHROPLASTY ANTERIOR APPROACH;  Surgeon: Loanne DrillingFrank Aluisio V, MD;  Location: [facility];  Service: Orthopedics;  Laterality: Left;  . Cataract extraction w/ intraocular lens  implant, bilateral Bilateral 3-01/2015  . Joint replacement    . Total shoulder arthroplasty Right 07/15/2015  . Total shoulder arthroplasty Right 07/15/2015    Procedure: RIGHT TOTAL SHOULDER ARTHROPLASTY;  Surgeon: Francena HanlyKevin Supple, MD;  Location: [facility];  Service: Orthopedics;  Laterality: Right;    There were no vitals filed for this visit.  Visit Diagnosis:  Muscle weakness of right upper extremity  Status post total shoulder replacement, right  Spasm of muscle      Subjective Assessment - 08/13/15 0913    Subjective Patient reports she is doing well and was able to put shirt on by herself this week for the first time.    Patient Stated Goals To be able to use right arm for personal care and household chores without stiffness or difficulty   Currently in Pain? No/denies           [facility] Treatment/Exercise - 08/13/15 0915    Exercises   Exercises Other Exercises   Other Exercises   forward elevation, ER, abduction within protocol guidelines with therapist  guidance and assistance to perform with proper alignment of shoulder: 3 sets of each, re instructed in proper technique for AAROM forward elevation and ER Performed isometric ER, abduction and RS forward elevation at 90 degrees, requiring contact guard assist to maintain proper alignment,elbow extension 2 x 10 reps with assistance     Manual therapy: soft tissue mobilization performed with patient supine for pectoral and upper trapezius and upper arm to improve elasticity and ROM with decreased pain Patient response to treatment: improved soft tissue elasticity with STM, improved AAROM/PROM right shoulder forward elevation to 100, ER to 20 degrees  with less stiffness, able to perform exercise with guidance and verbal cuing for correct technique          PT Education - 08/13/15 0930    Education provided Yes   Education Details continue within limits of protocol to 100 degrees of flexion, ER to 20   Person(s) Educated Patient   Methods Explanation;Verbal cues   Comprehension Verbalized understanding;Returned demonstration;Verbal cues required             PT Long Term Goals - 07/29/15 1930    PT LONG TERM GOAL #1   Title Patient will demonstrate imrpoved function with decreased pain for daily tasks as demonstrated by QuickDash score of 35%  or better by 09/03/2015   Baseline QuickDash score = 45%   Status New   PT LONG TERM GOAL #2   Title Patient will imrpove ROM right shoulder to 140 or better by 09/24/2015 to improve function for shoulder level and above in order to wash hair and reach into cabinets   Baseline PROM right shoulder 80 degrees forward elevation with stiffness/pain   Status New   PT LONG TERM GOAL #3   Title Patient will demonstrate imrpoved function with decreased pain for daily tasks as demonstrated by QuickDash score of 25%  or better by 09/24/2015   Baseline QuickDash score = 45%   Status New   PT LONG TERM GOAL #4   Title Patient will be independent in home program for pain control, strength and felxibility exercises for continued progression towards full function with right UE by 09/24/2015   Baseline Patient has limited/no knowledge of appropriate exercises or progression to improve ROM/strength in order to return to full funcitonal use right UE   Status New               Plan - 08/13/15 1453    Clinical Impression Statement Patient demonstrated improved ROM with decreased stiffness right shoulder FF 100, ER 20.  She is able to decrease to 1x/week for continued physical to improve ROM and strength with guided exercises per protocol.    Pt will benefit from skilled therapeutic intervention  in order to improve on the following deficits Impaired flexibility;Decreased knowledge of precautions;Decreased activity tolerance;Increased muscle spasms;Impaired UE functional use;Decreased range of motion   Rehab Potential Fair   PT Frequency 2x / week   PT Duration 8 weeks   PT Treatment/Interventions Therapeutic exercise;Electrical Stimulation;Cryotherapy;Moist Heat;Passive range of motion;Scar mobilization;Patient/family education;Neuromuscular re-education;Manual techniques   PT Next Visit Plan per protocol, manual STM, modalities for spasms/pain, ther. ex. to improve PROM        Problem List Patient Active Problem List   Diagnosis Date Noted  . Urinary urgency 07/29/2015  . Urinary incontinence 07/29/2015  . S/P shoulder replacement 07/15/2015  . Subclinical hypothyroidism 04/20/2015  . Adjustment reaction to medical therapy 02/19/2015  . Arthritis 02/19/2015  . Body mass index 27.0-27.9,  adult 02/19/2015  . Alveolar aeration decreased 02/19/2015  . Colon, diverticulosis 02/19/2015  . Gastric ulcer 02/19/2015  . High potassium 02/19/2015  . BP (high blood pressure) 02/19/2015  . Hypertriglyceridemia 02/19/2015  . Arthritis, degenerative 02/19/2015  . OP (osteoporosis) 02/19/2015  . Vitamin D deficiency 02/19/2015  . Aldosterone deficiency (HCC) 10/06/2014  . OA (osteoarthritis) of hip 08/12/2014  . Hyponatremia 11/06/2013  . Postoperative anemia due to acute blood loss 11/06/2013  . OA (osteoarthritis) of knee 10/31/2013    Beacher May PT 08/14/2015, 1:28 PM  [place] [facility] PHYSICAL AND SPORTS MEDICINE [address] [place], [address] Phone: [phone]   Fax:  [phone]  Name: LOUCINDA CROY MRN: [id number] Date of Birth: June 04, 1934

## 2015-08-15 IMAGING — DX DG PORTABLE PELVIS
1 series · 1 of 1 positions shown · non-contrast
Comparison: None.

CLINICAL DATA: Left hip replacement

EXAM:
PORTABLE PELVIS 1-2 VIEWS; DG C-ARM 1-60 MIN - NRPT [facility]

[pelvis ap]
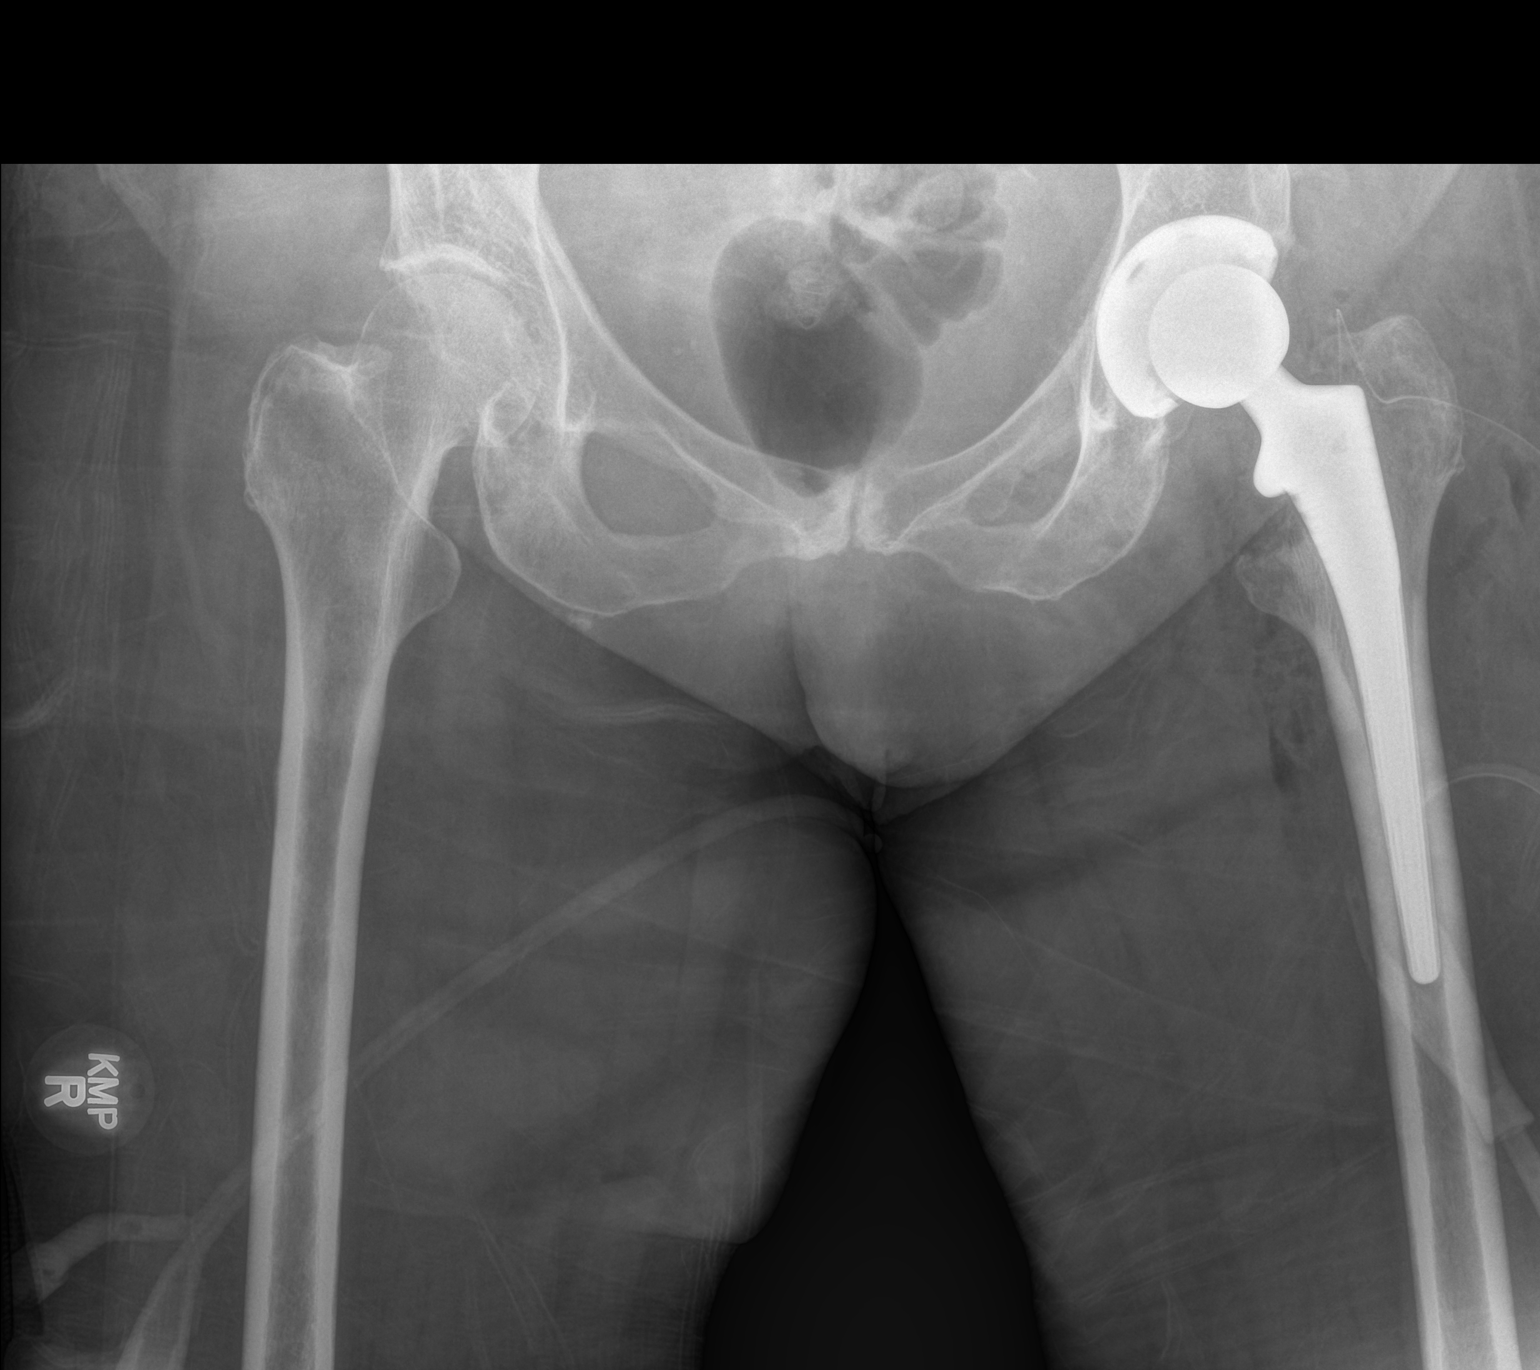

[1 of 1 positions shown; findings below may reference images not displayed]

FINDINGS: Left hip replacement in satisfactory position alignment. No
immediate complication. No fracture identified.
IMPRESSION: Satisfactory left hip replacement.

## 2015-08-16 ENCOUNTER — Ambulatory Visit: Payer: Medicare Other | Admitting: Physical Therapy

## 2015-08-20 ENCOUNTER — Ambulatory Visit: Payer: Medicare Other | Admitting: Physical Therapy

## 2015-08-20 ENCOUNTER — Encounter: Payer: Self-pay | Admitting: Physical Therapy

## 2015-08-20 DIAGNOSIS — M6281 Muscle weakness (generalized): Secondary | ICD-10-CM

## 2015-08-20 DIAGNOSIS — Z96611 Presence of right artificial shoulder joint: Secondary | ICD-10-CM

## 2015-08-20 DIAGNOSIS — M62838 Other muscle spasm: Secondary | ICD-10-CM

## 2015-08-20 NOTE — Therapy (Signed)
[place] [facility] PH[facility]IC[facility] [address] [place], [place], [address] Phone: [phone]   Fax:  [phone]  Physical Therapy Treatment  Patient Details  Name: [name] MRN: [id number] Date of Birth: [birth year] No Data Recorded Referring Provider: Francena HanlySupple, Kevin, MD  Encounter Date: 08/20/2015      PT End of Session - 08/20/15 1026    Visit Number 6   Number of Visits 16   Date for PT Re-Evaluation 09/24/15   Authorization Type 6   Authorization Time Period 10   PT Start Time 0956   PT Stop Time 1022   PT Time Calculation (min) 26 min   Activity Tolerance Patient tolerated treatment well   Behavior During Therapy [facility] for tasks assessed/performed      Past Medical History  Diagnosis Date  . PONV (postoperative nausea and vomiting)     severe  . Peptic ulcer disease with hemorrhage 10/13/2008    admitted to hospital .had 4 blood transfusions  . Hypertension   . Neuromuscular disorder (HCC)     left knee  . History of blood transfusion 2009  . Arthritis     "shoulders, knees, right thumb" (08/15/2012)  . Hypothyroidism     not on meds at present. MD watching  . Anxiety   . Depression   . Chronic kidney disease   . Incontinence     Past Surgical History  Procedure Laterality Date  . Tonsillectomy and adenoidectomy  ~ 1945  . Nasal polyp excision  ~ 1946  . Shoulder surgery Left ~ 2003    "bone spur removed"  . Reverse shoulder arthroplasty  08/15/2012    Procedure: REVERSE SHOULDER ARTHROPLASTY;  Surgeon: Senaida LangeKevin M Supple, MD;  Location: [facility];  Service: Orthopedics;  Laterality: Left;  . Total knee arthroplasty Right 10/31/2013    Procedure: RIGHT TOTAL KNEE ARTHROPLASTY;  Surgeon: Loanne DrillingFrank V Aluisio, MD;  Location[facility];  Service: Orthopedics;  Laterality: Right;  . Injection knee Left 10/31/2013    Procedure: CORTIZONE INJECTION LEFT KNEE ;  Surgeon: Loanne DrillingFrank V Aluisio, MD;  Location: [facility];  Service:  Orthopedics;  Laterality: Left;  injected at 1624 under anesthesia  . Total hip arthroplasty Left 08/12/2014    Procedure: LEFT TOTAL HIP ARTHROPLASTY ANTERIOR APPROACH;  Surgeon: Loanne DrillingFrank Aluisio V, MD;  Location: [facility];  Service: Orthopedics;  Laterality: Left;  . Cataract extraction w/ intraocular lens  implant, bilateral Bilateral 3-01/2015  . Joint replacement    . Total shoulder arthroplasty Right 07/15/2015  . Total shoulder arthroplasty Right 07/15/2015    Procedure: RIGHT TOTAL SHOULDER ARTHROPLASTY;  Surgeon: Francena HanlyKevin Supple, MD;  Location: [facility];  Service: Orthopedics;  Laterality: Right;    There were no vitals filed for this visit.  Visit Diagnosis:  Muscle weakness of right upper extremity  Status post total shoulder replacement, right  Spasm of muscle      Subjective Assessment - 08/20/15 0959    Subjective Patient reports she is doing well and was able to put shirt on by herself this week for the first time.    Patient Stated Goals To be able to use right arm for personal care and household chores without stiffness or difficulty   Currently in Pain? No/denies       Objective: No sling in use right UE on arrival AAROM; right shoulder forward elevation 120 with discomfort at end range, ER to 20, IR to 45  [facility] Adult PT Treatment/Exercise - 08/20/15 1302    Exercises   Exercises Other Exercises   Other Exercises  PROM/AAROM right shoulder with patient supine lying: performed PROM/AAROM to right shoulder per protocol to 120  forward elevation, ER to 20/25 neutral as tolerated, abduction to ~45 degrees, IR to abdomen x 3 sets as tolerated with verbal cues and tactile cues to not use excessive effort to keep shoulder in alignment during entire ROM, added AAROM with right /left UE supine lying with verbal cues, hands to forehead, sitting using towel for flexion and rotations on table with demonstration, AAROM in sitting with clasped hands with verbal cues and  demonstration for correct alignment and to deceased hiking of shoulder during exercise      Patient response to treatment: improved PROM to 25 ER, 120 degrees elevation with less stiffness and able to perform exercises with decreased excessive effort noted with repetition and verbal cues, improved shoulder exercises with decreased hiking of right shoulder following demonstration and with verbal cues.             PT Education - 08/20/15 1300    Education provided Yes   Education Details educated in proper alignment of shoulders to decreased hiking of shoulder with AAROM exercise on table and in sitting   Person(s) Educated Patient   Methods Explanation;Demonstration;Verbal cues   Comprehension Verbalized understanding;Returned demonstration;Verbal cues required             PT Long Term Goals - 07/29/15 1930    PT LONG TERM GOAL #1   Title Patient will demonstrate imrpoved function with decreased pain for daily tasks as demonstrated by QuickDash score of 35%  or better by 09/03/2015   Baseline QuickDash score = 45%   Status New   PT LONG TERM GOAL #2   Title Patient will imrpove ROM right shoulder to 140 or better by 09/24/2015 to improve function for shoulder level and above in order to wash hair and reach into cabinets   Baseline PROM right shoulder 80 degrees forward elevation with stiffness/pain   Status New   PT LONG TERM GOAL #3   Title Patient will demonstrate imrpoved function with decreased pain for daily tasks as demonstrated by QuickDash score of 25%  or better by 09/24/2015   Baseline QuickDash score = 45%   Status New   PT LONG TERM GOAL #4   Title Patient will be independent in home program for pain control, strength and felxibility exercises for continued progression towards full function with right UE by 09/24/2015   Baseline Patient has limited/no knowledge of appropriate exercises or progression to improve ROM/strength in order to return to full funcitonal  use right UE   Status New               Plan - 08/20/15 1247    Clinical Impression Statement Improving ROM with less stiffness in right shoulder: 120 forward elevatin, ER 20/25. She is now progressing to increased AAROM/AROM with functional tasks at home and should continue to improve with guided physical therpay.    Rehab Potential Fair   PT Frequency 2x / week   PT Duration 8 weeks   PT Treatment/Interventions Therapeutic exercise;Electrical Stimulation;Cryotherapy;Moist Heat;Passive range of motion;Scar mobilization;Patient/family education;Neuromuscular re-education;Manual techniques   PT Next Visit Plan per protocol, manual STM, modalities for spasms/pain, ther. ex. to improve PROM        Problem List Patient Active Problem List   Diagnosis Date Noted  . Urinary urgency 07/29/2015  .  Urinary incontinence 07/29/2015  . S/P shoulder replacement 07/15/2015  . Subclinical hypothyroidism 04/20/2015  . Adjustment reaction to medical therapy 02/19/2015  . Arthritis 02/19/2015  . Body mass index 27.0-27.9, adult 02/19/2015  . Alveolar aeration decreased 02/19/2015  . Colon, diverticulosis 02/19/2015  . Gastric ulcer 02/19/2015  . High potassium 02/19/2015  . BP (high blood pressure) 02/19/2015  . Hypertriglyceridemia 02/19/2015  . Arthritis, degenerative 02/19/2015  . OP (osteoporosis) 02/19/2015  . Vitamin D deficiency 02/19/2015  . Aldosterone deficiency (HCC) 10/06/2014  . OA (osteoarthritis) of hip 08/12/2014  . Hyponatremia 11/06/2013  . Postoperative anemia due to acute blood loss 11/06/2013  . OA (osteoarthritis) of knee 10/31/2013    Beacher May PT 08/20/2015, 1:03 PM  [place] [facility] PHYSICAL AND SPORTS MEDICINE [address], [place], [address] Phone: [phone]   Fax:  [phone]  Name: [name] MRN: [id number] Date of Birth: 1934-10-04

## 2015-08-23 ENCOUNTER — Encounter: Payer: Medicare Other | Admitting: Physical Therapy

## 2015-08-27 ENCOUNTER — Ambulatory Visit: Payer: Medicare Other | Attending: Orthopedic Surgery | Admitting: Physical Therapy

## 2015-08-27 ENCOUNTER — Encounter: Payer: Self-pay | Admitting: Physical Therapy

## 2015-08-27 DIAGNOSIS — M62838 Other muscle spasm: Secondary | ICD-10-CM | POA: Diagnosis present

## 2015-08-27 DIAGNOSIS — M6281 Muscle weakness (generalized): Secondary | ICD-10-CM

## 2015-08-27 DIAGNOSIS — Z96611 Presence of right artificial shoulder joint: Secondary | ICD-10-CM | POA: Diagnosis present

## 2015-08-27 NOTE — Therapy (Signed)
[place] [facility] [address], [place], [address] Phone: [phone]   Fax:  [phone]  Physical Therapy Treatment  Patient Details  Name: [name] MRN: [id number] Date of Birth: [birth year] Referring Provider: Francena Hanly, MD  Encounter Date: 08/27/2015      PT End of Session - 08/27/15 0834    Visit Number 7   Number of Visits 16   Date for PT Re-Evaluation 09/24/15   Authorization Type 7   Authorization Time Period 10   PT Start Time 0830   PT Stop Time 0915   PT Time Calculation (min) 45 min   Activity Tolerance Patient tolerated treatment well   Behavior During Therapy [facility] for tasks assessed/performed      Past Medical History  Diagnosis Date  . PONV (postoperative nausea and vomiting)     severe  . Peptic ulcer disease with hemorrhage 10/13/2008    admitted to hospital .had 4 blood transfusions  . Hypertension   . Neuromuscular disorder (HCC)     left knee  . History of blood transfusion 2009  . Arthritis     "shoulders, knees, right thumb" (08/15/2012)  . Hypothyroidism     not on meds at present. MD watching  . Anxiety   . Depression   . Chronic kidney disease   . Incontinence     Past Surgical History  Procedure Laterality Date  . Tonsillectomy and adenoidectomy  ~ 1945  . Nasal polyp excision  ~ 1946  . Shoulder surgery Left ~ 2003    "bone spur removed"  . Reverse shoulder arthroplasty  08/15/2012    Procedure: REVERSE SHOULDER ARTHROPLASTY;  Surgeon: Senaida Lange, MD;  [facility]: [facility];  Service: Orthopedics;  Laterality: Left;  . Total knee arthroplasty Right 10/31/2013    Procedure: RIGHT TOTAL KNEE ARTHROPLASTY;  Surgeon: Loanne Drilling, MD;  Location: [facility];  Service: Orthopedics;  Laterality: Right;  . Injection knee Left 10/31/2013    Procedure: CORTIZONE INJECTION LEFT KNEE ;  Surgeon: Loanne Drilling, MD;  Location: [facility];  Service: Orthopedics;  Laterality:  Left;  injected at 1624 under anesthesia  . Total hip arthroplasty Left 08/12/2014    Procedure: LEFT TOTAL HIP ARTHROPLASTY ANTERIOR APPROACH;  Surgeon: Loanne Drilling, MD;  Location: [facility];  Service: Orthopedics;  Laterality: Left;  . Cataract extraction w/ intraocular lens  implant, bilateral Bilateral 3-01/2015  . Joint replacement    . Total shoulder arthroplasty Right 07/15/2015  . Total shoulder arthroplasty Right 07/15/2015    Procedure: RIGHT TOTAL SHOULDER ARTHROPLASTY;  Surgeon: Francena Hanly, MD;  Location: [facility];  Service: Orthopedics;  Laterality: Right;    There were no vitals filed for this visit.  Visit Diagnosis:  Muscle weakness of right upper extremity  Status post total shoulder replacement, right  Spasm of muscle      Subjective Assessment - 08/27/15 0832    Subjective Patient reports she is doing well and is using right UE more at home. 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     Patient Stated Goals To be able to use right arm for personal care and household chores without stiffness or difficulty   Currently in Pain? No/denies      Objective: Palpation: + spasms with decreased soft tissue elasticity along right upper arm, pectoral muscles AAROM: right shoulder in supine lying: forward elevation 120, ER 20, IR across  trunk and reach to side of hip       [facility] Adult PT Treatment/Exercise - 08/27/15 0833    Exercises   Exercises Other Exercises   Other Exercises  PROM/AAROM right shoulder with patient supine lying: performed PROM/AAROM to right shoulder per protocol to 120 forward elevation, ER to 20/25 neutral as tolerated, abduction to 80 degrees, IR to abdomen x 3 sets as tolerated with verbal cues and tactile cues to not use excessive effort to keep shoulder in alignment during entire ROM, added AROM with right  UE supine lying with verbal cues, hand to nose x 10 reps then to forehead up to 10 reps, standing AAROM  with UE ranger performed on floor with tactile cues of PT for performing with correct alignment and technique and on wall FF 2 sets up to 10 reps, standing at wall performed shoulder abduction and scaption through partial ROM/shor arc of motion x 10 reps,      manual therapy: soft tissue mobilization performed to right upper arm/pectoral muscle superficial technique to improve elasticity and decreased pain for exercises   Patient response to treatment: improved soft tissue elasticity and  AROM with right arm to forehead with guided motion and repetition, improved motion with active exercises, required demonstration and verbal and tactile cues to perform exercises with right shoulder in good alignment and with proper technique        PT Education - 08/27/15 0900    Education provided Yes   Education Details instructed in performing additional exercises for home including AROM in supine and standing against wall (abduction and scaption)   Person(s) Educated Patient   Methods Explanation;Demonstration;Verbal cues   Comprehension Verbalized understanding;Returned demonstration;Verbal cues required             PT Long Term Goals - 07/29/15 1930    PT LONG TERM GOAL #1   Title Patient will demonstrate imrpoved function with decreased pain for daily tasks as demonstrated by QuickDash score of 35%  or better by 09/03/2015   Baseline QuickDash score = 45%   Status New   PT LONG TERM GOAL #2   Title Patient will imrpove ROM right shoulder to 140 or better by 09/24/2015 to improve function for shoulder level and above in order to wash hair and reach into cabinets   Baseline PROM right shoulder 80 degrees forward elevation with stiffness/pain   Status New   PT LONG TERM GOAL #3   Title Patient will demonstrate imrpoved function with decreased pain for daily tasks as demonstrated by QuickDash score of 25%  or better by 09/24/2015   Baseline QuickDash score = 45%   Status New   PT LONG TERM  GOAL #4   Title Patient will be independent in home program for pain control, strength and felxibility exercises for continued progression towards full function with right UE by 09/24/2015   Baseline Patient has limited/no knowledge of appropriate exercises or progression to improve ROM/strength in order to return to full funcitonal use right UE   Status New               Plan - 08/27/15 0920    Clinical Impression Statement Patient demonstrated improved abiltiy to perform AROM with less difficulty with repetition. She continues wtih limitations of AROM and strength s/p TSA and will require continued physical therapy intervention to achieve improved function with personal care and household tasks.    Pt will benefit from skilled therapeutic intervention in order to improve on the  following deficits Impaired flexibility;Decreased knowledge of precautions;Decreased activity tolerance;Increased muscle spasms;Impaired UE functional use;Decreased range of motion   Rehab Potential Fair   PT Frequency 2x / week   PT Duration 8 weeks   PT Treatment/Interventions Therapeutic exercise;Electrical Stimulation;Cryotherapy;Moist Heat;Passive range of motion;Scar mobilization;Patient/family education;Neuromuscular re-education;Manual techniques   PT Next Visit Plan per protocol, manual STM, modalities for spasms/pain, ther. ex. to improve PROM        Problem List Patient Active Problem List   Diagnosis Date Noted  . Urinary urgency 07/29/2015  . Urinary incontinence 07/29/2015  . S/P shoulder replacement 07/15/2015  . Subclinical hypothyroidism 04/20/2015  . Adjustment reaction to medical therapy 02/19/2015  . Arthritis 02/19/2015  . Body mass index 27.0-27.9, adult 02/19/2015  . Alveolar aeration decreased 02/19/2015  . Colon, diverticulosis 02/19/2015  . Gastric ulcer 02/19/2015  . High potassium 02/19/2015  . BP (high blood pressure) 02/19/2015  . Hypertriglyceridemia 02/19/2015  .  Arthritis, degenerative 02/19/2015  . OP (osteoporosis) 02/19/2015  . Vitamin D deficiency 02/19/2015  . Aldosterone deficiency (HCC) 10/06/2014  . OA (osteoarthritis) of hip 08/12/2014  . Hyponatremia 11/06/2013  . Postoperative anemia due to acute blood loss 11/06/2013  . OA (osteoarthritis) of knee 10/31/2013    Beacher May PT 08/27/2015, 4:26 PM  [place] [facility] SP[facility]IC[facility] [address] [place], [address] Phone: [phone]   Fax:  [phone]  Name: [name] MRN: [id number] Date of Birth: [birth year]

## 2015-08-30 ENCOUNTER — Encounter: Payer: Medicare Other | Admitting: Physical Therapy

## 2015-09-03 ENCOUNTER — Ambulatory Visit: Payer: Medicare Other | Admitting: Physical Therapy

## 2015-09-03 ENCOUNTER — Encounter: Payer: Self-pay | Admitting: Physical Therapy

## 2015-09-03 DIAGNOSIS — M62838 Other muscle spasm: Secondary | ICD-10-CM

## 2015-09-03 DIAGNOSIS — M6281 Muscle weakness (generalized): Secondary | ICD-10-CM | POA: Diagnosis not present

## 2015-09-03 DIAGNOSIS — Z96611 Presence of right artificial shoulder joint: Secondary | ICD-10-CM

## 2015-09-03 NOTE — Therapy (Signed)
[place] [facility] [address], [place], [address] Phone: [phone]   Fax:  [phone]  Physical Therapy Treatment  Patient Details  Name: [name] MRN: [id number] Date of Birth: [birth year] Referring Provider: Francena Hanly MD  Encounter Date: 09/03/2015      PT End of Session - 09/03/15 1148    Visit Number 8   Number of Visits 16   Date for PT Re-Evaluation 09/24/15   Authorization Type 8   Authorization Time Period 10   PT Start Time 1055   PT Stop Time 1135   PT Time Calculation (min) 40 min   Activity Tolerance Patient tolerated treatment well   Behavior During Therapy [facility] for tasks assessed/performed      Past Medical History  Diagnosis Date  . PONV (postoperative nausea and vomiting)     severe  . Peptic ulcer disease with hemorrhage 10/13/2008    admitted to hospital .had 4 blood transfusions  . Hypertension   . Neuromuscular disorder (HCC)     left knee  . History of blood transfusion 2009  . Arthritis     "shoulders, knees, right thumb" (08/15/2012)  . Hypothyroidism     not on meds at present. MD watching  . Anxiety   . Depression   . Chronic kidney disease   . Incontinence     Past Surgical History  Procedure Laterality Date  . Tonsillectomy and adenoidectomy  ~ 1945  . Nasal polyp excision  ~ 1946  . Shoulder surgery Left ~ 2003    "bone spur removed"  . Reverse shoulder arthroplasty  08/15/2012    Procedure: REVERSE SHOULDER ARTHROPLASTY;  Surgeon: Senaida Lange, MD;  Location: [facility];  Service: Orthopedics;  Laterality: Left;  . Total knee arthroplasty Right 10/31/2013    Procedure: RIGHT TOTAL KNEE ARTHROPLASTY;  Surgeon: Loanne Drilling, MD;  Location: [facility];  Service: Orthopedics;  Laterality: Right;  . Injection knee Left 10/31/2013    Procedure: CORTIZONE INJECTION LEFT KNEE ;  Surgeon: Loanne Drilling, MD;  Location: [facility];  Service: Orthopedics;  Laterality:  Left;  injected at 1624 under anesthesia  . Total hip arthroplasty Left 08/12/2014    Procedure: LEFT TOTAL HIP ARTHROPLASTY ANTERIOR APPROACH;  Surgeon: Loanne Drilling, MD;  [facility]: [facility];  Service: Orthopedics;  Laterality: Left;  . Cataract extraction w/ intraocular lens  implant, bilateral Bilateral 3-01/2015  . Joint replacement    . Total shoulder arthroplasty Right 07/15/2015  . Total shoulder arthroplasty Right 07/15/2015    Procedure: RIGHT TOTAL SHOULDER ARTHROPLASTY;  Surgeon: Francena Hanly, MD;  Location: [facility];  Service: Orthopedics;  Laterality: Right;    There were no vitals filed for this visit.  Visit Diagnosis:  Muscle weakness of right upper extremity  Status post total shoulder replacement, right  Spasm of muscle      Subjective Assessment - 09/03/15 1058    Subjective Patient reports she is doing well and she is able to use her right arm with less difficulty and sleeping is not bothered by the shoulder.    Patient Stated Goals To be able to use right arm for personal care and household chores without stiffness or difficulty   Currently in Pain? No/denies         Objective: Palpation: + spasms with decreased soft tissue elasticity along right upper arm,  Subscapluaris, upper trapezius  AAROM: right shoulder in supine lying: forward elevation 120, ER  20, IR across trunk and reach to side of hip (pre treatment)          [facility] Adult PT Treatment/Exercise - 09/03/15 1059    Exercises   Exercises Other Exercises   Other Exercises  All exercises performed with guidance, verbal cuing, tactile cuing of therapist: PROM/AAROM right shoulder with patient supine lying: performed PROM/AAROM to right shoulder per protocol to 130 forward elevation, ER to 30 as tolerated, abduction to 80 degrees, IR to abdomen x 3 sets as tolerated with verbal cues and tactile cues to not use excessive effort to keep shoulder in alignment during entire ROM,  AROM performed with verbal  cues with right UE supine lying with verbal cues, hand to nose x 10 reps then to forehead up to 10 reps, rhythmic stabilization for rotation and at 90 degrees flexion 2 sets x 10 reps with light resistance given, standing AAROM with UE ranger performed on floor with tactile cues of PT for performing with correct alignment and technique and on wall FF 1 sets up to 10 reps       manual therapy: soft tissue mobilization performed to right upper arm/pectoral muscle, subscapularis, lats. And upper trapezius superficial techniques to improve elasticity and decreased pain for exercises   Patient response to treatment: improved soft tissue elasticity and AROM with right arm to forehead with guided motion and repetition, Improved AAROM to 130 from 120 degrees flexion, improved motion with active exercises, required demonstration and verbal and tactile cues to perform exercises with right shoulder in good alignment and with proper technique          PT Education - 09/03/15 1145    Education provided Yes   Education Details re assessed home program to improve ROM and strength right UE with good alignment of shoulder girdle   Person(s) Educated Patient   Methods Explanation;Tactile cues;Verbal cues;Demonstration   Comprehension Verbalized understanding;Returned demonstration;Verbal cues required;Tactile cues required             PT Long Term Goals - 07/29/15 1930    PT LONG TERM GOAL #1   Title Patient will demonstrate improved function with decreased pain for daily tasks as demonstrated by QuickDash score of 35%  or better by 09/03/2015   Baseline QuickDash score = 45%   Status New   PT LONG TERM GOAL #2   Title Patient will imrpove ROM right shoulder to 140 or better by 09/24/2015 to improve function for shoulder level and above in order to wash hair and reach into cabinets   Baseline PROM right shoulder 80 degrees forward elevation with stiffness/pain   Status New   PT LONG TERM GOAL #3    Title Patient will demonstrate imrpoved function with decreased pain for daily tasks as demonstrated by QuickDash score of 25%  or better by 09/24/2015   Baseline QuickDash score = 45%   Status New   PT LONG TERM GOAL #4   Title Patient will be independent in home program for pain control, strength and felxibility exercises for continued progression towards full function with right UE by 09/24/2015   Baseline Patient has limited/no knowledge of appropriate exercises or progression to improve ROM/strength in order to return to full funcitonal use right UE   Status New               Plan - 09/03/15 1200    Clinical Impression Statement Patient demonstrates improving srength, ROM and understanding of home program. She continues to require guidance and  assistance to perform all exercises with good alignment of right shoulder and for appropriate progression of exercises s/p TSA.    Pt will benefit from skilled therapeutic intervention in order to improve on the following deficits Impaired flexibility;Decreased knowledge of precautions;Decreased activity tolerance;Increased muscle spasms;Impaired UE functional use;Decreased range of motion   Rehab Potential Fair   PT Frequency 2x / week   PT Duration 8 weeks   PT Treatment/Interventions Therapeutic exercise;Electrical Stimulation;Cryotherapy;Moist Heat;Passive range of motion;Scar mobilization;Patient/family education;Neuromuscular re-education;Manual techniques   PT Next Visit Plan per protocol, manual STM, modalities for spasms/pain, ther. ex. to improve PROM        Problem List Patient Active Problem List   Diagnosis Date Noted  . Urinary urgency 07/29/2015  . Urinary incontinence 07/29/2015  . S/P shoulder replacement 07/15/2015  . Subclinical hypothyroidism 04/20/2015  . Adjustment reaction to medical therapy 02/19/2015  . Arthritis 02/19/2015  . Body mass index 27.0-27.9, adult 02/19/2015  . Alveolar aeration decreased  02/19/2015  . Colon, diverticulosis 02/19/2015  . Gastric ulcer 02/19/2015  . High potassium 02/19/2015  . BP (high blood pressure) 02/19/2015  . Hypertriglyceridemia 02/19/2015  . Arthritis, degenerative 02/19/2015  . OP (osteoporosis) 02/19/2015  . Vitamin D deficiency 02/19/2015  . Aldosterone deficiency (HCC) 10/06/2014  . OA (osteoarthritis) of hip 08/12/2014  . Hyponatremia 11/06/2013  . Postoperative anemia due to acute blood loss 11/06/2013  . OA (osteoarthritis) of knee 10/31/2013    Beacher MayBrooks, Marie PT 09/03/2015, 10:23 PM  [place] [facility] [address] [place], [place], [address] Phone: [phone]   Fax:  [phone]  Name: [name] MRN: [id number] Date of Birth: 1934-05-16

## 2015-09-06 ENCOUNTER — Encounter: Payer: Medicare Other | Admitting: Physical Therapy

## 2015-09-08 ENCOUNTER — Ambulatory Visit: Payer: Medicare Other | Admitting: Physical Therapy

## 2015-09-10 ENCOUNTER — Ambulatory Visit: Payer: Medicare Other | Admitting: Physical Therapy

## 2015-09-10 ENCOUNTER — Encounter: Payer: Self-pay | Admitting: Physical Therapy

## 2015-09-10 DIAGNOSIS — M6281 Muscle weakness (generalized): Secondary | ICD-10-CM

## 2015-09-10 DIAGNOSIS — Z96611 Presence of right artificial shoulder joint: Secondary | ICD-10-CM

## 2015-09-10 NOTE — Therapy (Signed)
[facility] PH[facility]ORTS MEDICINE [address] [place], [address] Phone: [phone]   Fax:  [phone]  Physical Therapy Treatment  Patient Details  Name: [name] MRN: [id number] Date of Birth: 07/03/1934 Referring Provider: Francena Hanly MD  Encounter Date: 09/10/2015      PT End of Session - 09/10/15 1105    Visit Number 9   Number of Visits 16   Date for PT Re-Evaluation 09/24/15   Authorization Type 9   Authorization Time Period 10   PT Start Time 1055   PT Stop Time 1130   PT Time Calculation (min) 35 min   Activity Tolerance Patient tolerated treatment well   Behavior During Therapy [facility] for tasks assessed/performed      Past Medical History  Diagnosis Date  . PONV (postoperative nausea and vomiting)     severe  . Peptic ulcer disease with hemorrhage 10/13/2008    admitted to hospital .had 4 blood transfusions  . Hypertension   . Neuromuscular disorder (HCC)     left knee  . History of blood transfusion 2009  . Arthritis     "shoulders, knees, right thumb" (08/15/2012)  . Hypothyroidism     not on meds at present. MD watching  . Anxiety   . Depression   . Chronic kidney disease   . Incontinence     Past Surgical History  Procedure Laterality Date  . Tonsillectomy and adenoidectomy  ~ 1945  . Nasal polyp excision  ~ 1946  . Shoulder surgery Left ~ 2003    "bone spur removed"  . Reverse shoulder arthroplasty  08/15/2012    Procedure: REVERSE SHOULDER ARTHROPLASTY;  Surgeon: Senaida Lange, MD;  [facility]: [facility];  Service: Orthopedics;  Laterality: Left;  . Total knee arthroplasty Right 10/31/2013    Procedure: RIGHT TOTAL KNEE ARTHROPLASTY;  Surgeon: Loanne Drilling, MD;  Location: [facility];  Service: Orthopedics;  Laterality: Right;  . Injection knee Left 10/31/2013    Procedure: CORTIZONE INJECTION LEFT KNEE ;  Surgeon: Loanne Drilling, MD;  Location: [facility];  Service: Orthopedics;  Laterality:  Left;  injected at 1624 under anesthesia  . Total hip arthroplasty Left 08/12/2014    Procedure: LEFT TOTAL HIP ARTHROPLASTY ANTERIOR APPROACH;  Surgeon: Loanne Drilling, MD;  Location: [facility];  Service: Orthopedics;  Laterality: Left;  . Cataract extraction w/ intraocular lens  implant, bilateral Bilateral 3-01/2015  . Joint replacement    . Total shoulder arthroplasty Right 07/15/2015  . Total shoulder arthroplasty Right 07/15/2015    Procedure: RIGHT TOTAL SHOULDER ARTHROPLASTY;  Surgeon: Francena Hanly, MD;  [facility]: [facility];  Service: Orthopedics;  Laterality: Right;    There were no vitals filed for this visit.  Visit Diagnosis:  Muscle weakness of right upper extremity  Status post total shoulder replacement, right      Subjective Assessment - 09/10/15 1101    Subjective Patient reports she is doing well and she is able to use her right arm with less difficulty and sleeping is not bothered by the shoulder.    Patient Stated Goals To be able to use right arm for personal care and household chores without stiffness or difficulty   Currently in Pain? No/denies       Objective: AAROM right shoulder forward elevation (supine) 140, ER pre treatment 20, post treatment 30 Palpation: decreased soft tissue elasticity with + moderate spasms palpable in lats and subscapularis and upper trapezius right shoulder  [facility] Adult PT Treatment/Exercise - 09/10/15 1102    Exercises   Exercises Other Exercises   Other Exercises  All exercises performed with guidance, verbal cuing, tactile cuing of therapist: PROM/AAROM right shoulder with patient supine lying: performed PROM/AAROM to right shoulder per protocol to 130 forward elevation, ER to 30 as tolerated, abduction to 80 degrees, IR to abdomen x 3 sets as tolerated with verbal cues and tactile cues to not use excessive effort to keep shoulder in alignment during entire ROM, rhythmic stabilization for rotation and at 90 degrees flexion 2  sets x 10 reps with light resistance given, concentration given to active stretching with resistance to increased ER with patient in supine with hand on forehead 5 reps with 5 second holds with instruction to perform at home          manual therapy: soft tissue mobilization performed to right upper arm/pectoral muscle, subscapularis, lats. And upper trapezius superficial techniques to improve elasticity and decreased pain for exercises  Patient response to treatment: improved soft tissue elasticity and AROM with right arm to forehead with guided motion and repetition, Improved AAROM to 130 from 120 degrees flexion, improved motion with active exercises, required demonstration and verbal and tactile cues to perform exercises with right shoulder in good alignment and with proper technique, patient was able to improve ER ROM with active stretching with cuing        PT Education - 09/10/15 1145    Education provided Yes   Education Details re instructed in home exercises for improving ER of right shoulder and flexion.   Person(s) Educated Patient   Methods Explanation;Demonstration;Verbal cues   Comprehension Verbalized understanding;Returned demonstration;Verbal cues required             PT Long Term Goals - 07/29/15 1930    PT LONG TERM GOAL #1   Title Patient will demonstrate imrpoved function with decreased pain for daily tasks as demonstrated by QuickDash score of 35%  or better by 09/03/2015   Baseline QuickDash score = 45%   Status New   PT LONG TERM GOAL #2   Title Patient will imrpove ROM right shoulder to 140 or better by 09/24/2015 to improve function for shoulder level and above in order to wash hair and reach into cabinets   Baseline PROM right shoulder 80 degrees forward elevation with stiffness/pain   Status New   PT LONG TERM GOAL #3   Title Patient will demonstrate imrpoved function with decreased pain for daily tasks as demonstrated by QuickDash score of 25%  or  better by 09/24/2015   Baseline QuickDash score = 45%   Status New   PT LONG TERM GOAL #4   Title Patient will be independent in home program for pain control, strength and felxibility exercises for continued progression towards full function with right UE by 09/24/2015   Baseline Patient has limited/no knowledge of appropriate exercises or progression to improve ROM/strength in order to return to full funcitonal use right UE   Status New               Plan - 09/10/15 1106    Clinical Impression Statement Patient is improving with strength and ROM right shoulder with improving functional use of right UE at home. She continues with decreased soft tissue mobility that limits full ROM in right shoulder and will benefit from additional physical therapy intervention to address this to allow improved functional use of right UE for personal care and household chores.  Pt will benefit from skilled therapeutic intervention in order to improve on the following deficits Impaired flexibility;Decreased knowledge of precautions;Decreased activity tolerance;Increased muscle spasms;Impaired UE functional use;Decreased range of motion   Rehab Potential Fair   PT Frequency 2x / week   PT Duration 8 weeks   PT Treatment/Interventions Therapeutic exercise;Electrical Stimulation;Cryotherapy;Moist Heat;Passive range of motion;Scar mobilization;Patient/family education;Neuromuscular re-education;Manual techniques   PT Next Visit Plan per protocol, manual STM, modalities for spasms/pain, ther. ex. to improve PROM        Problem List Patient Active Problem List   Diagnosis Date Noted  . Urinary urgency 07/29/2015  . Urinary incontinence 07/29/2015  . S/P shoulder replacement 07/15/2015  . Subclinical hypothyroidism 04/20/2015  . Adjustment reaction to medical therapy 02/19/2015  . Arthritis 02/19/2015  . Body mass index 27.0-27.9, adult 02/19/2015  . Alveolar aeration decreased 02/19/2015  . Colon,  diverticulosis 02/19/2015  . Gastric ulcer 02/19/2015  . High potassium 02/19/2015  . BP (high blood pressure) 02/19/2015  . Hypertriglyceridemia 02/19/2015  . Arthritis, degenerative 02/19/2015  . OP (osteoporosis) 02/19/2015  . Vitamin D deficiency 02/19/2015  . Aldosterone deficiency (HCC) 10/06/2014  . OA (osteoarthritis) of hip 08/12/2014  . Hyponatremia 11/06/2013  . Postoperative anemia due to acute blood loss 11/06/2013  . OA (osteoarthritis) of knee 10/31/2013    Beacher May PT 09/11/2015, 4:45 PM  [facility] PHYSICAL AND SPORTS MEDICINE [address] [place], [address] Phone: [phone]   Fax:  [phone]  Name: [name] MRN: [id number] Date of Birth: 1934-03-31

## 2015-09-13 ENCOUNTER — Encounter: Payer: Self-pay | Admitting: Physical Therapy

## 2015-09-13 ENCOUNTER — Ambulatory Visit: Payer: Medicare Other | Admitting: Physical Therapy

## 2015-09-13 DIAGNOSIS — M6281 Muscle weakness (generalized): Secondary | ICD-10-CM | POA: Diagnosis not present

## 2015-09-13 DIAGNOSIS — M62838 Other muscle spasm: Secondary | ICD-10-CM

## 2015-09-13 DIAGNOSIS — Z96611 Presence of right artificial shoulder joint: Secondary | ICD-10-CM

## 2015-09-13 NOTE — Therapy (Signed)
[facility] PHYS[facility] MEDICINE [address] [place], [place], [address] Phone: [phone]   Fax:  [phone]  Physical Therapy Treatment/Discharge Summary  Patient Details  Name: [name] MRN: [id number] Date of Birth: 21-Dec-1933 Referring Provider: Francena HanlySupple, Kevin MD  Encounter Date: 09/13/2015   Patient began physical therapy on 07/29/2015 and was seen for 10 visits through 09/13/2015. She has achieved goals and is independent with home program and ready for discharge from physical therapy at this time.      PT End of Session - 09/13/15 1010    Visit Number 10   Number of Visits 16   Date for PT Re-Evaluation 09/24/15   Authorization Type 10   Authorization Time Period 10   PT Start Time 1004   PT Stop Time 1050   PT Time Calculation (min) 46 min   Activity Tolerance Patient tolerated treatment well   Behavior During Therapy WFL for tasks assessed/performed      Past Medical History  Diagnosis Date  . PONV (postoperative nausea and vomiting)     severe  . Peptic ulcer disease with hemorrhage 10/13/2008    admitted to hospital .had 4 blood transfusions  . Hypertension   . Neuromuscular disorder (HCC)     left knee  . History of blood transfusion 2009  . Arthritis     "shoulders, knees, right thumb" (08/15/2012)  . Hypothyroidism     not on meds at present. MD watching  . Anxiety   . Depression   . Chronic kidney disease   . Incontinence     Past Surgical History  Procedure Laterality Date  . Tonsillectomy and adenoidectomy  ~ 1945  . Nasal polyp excision  ~ 1946  . Shoulder surgery Left ~ 2003    "bone spur removed"  . Reverse shoulder arthroplasty  08/15/2012    Procedure: REVERSE SHOULDER ARTHROPLASTY;  Surgeon: Senaida LangeKevin M Supple, MD;  [facility]: [facility];  Service: Orthopedics;  Laterality: Left;  . Total knee arthroplasty Right 10/31/2013    Procedure: RIGHT TOTAL KNEE ARTHROPLASTY;  Surgeon: Loanne DrillingFrank V Aluisio,  MD;  Location: [facility];  Service: Orthopedics;  Laterality: Right;  . Injection knee Left 10/31/2013    Procedure: CORTIZONE INJECTION LEFT KNEE ;  Surgeon: Loanne DrillingFrank V Aluisio, MD;  Location: [facility];  Service: Orthopedics;  Laterality: Left;  injected at 1624 under anesthesia  . Total hip arthroplasty Left 08/12/2014    Procedure: LEFT TOTAL HIP ARTHROPLASTY ANTERIOR APPROACH;  Surgeon: Loanne DrillingFrank Aluisio V, MD;  Location: [facility];  Service: Orthopedics;  Laterality: Left;  . Cataract extraction w/ intraocular lens  implant, bilateral Bilateral 3-01/2015  . Joint replacement    . Total shoulder arthroplasty Right 07/15/2015  . Total shoulder arthroplasty Right 07/15/2015    Procedure: RIGHT TOTAL SHOULDER ARTHROPLASTY;  Surgeon: Francena HanlyKevin Supple, MD;  Location: [facility];  Service: Orthopedics;  Laterality: Right;    There were no vitals filed for this visit.  Visit Diagnosis:  Muscle weakness of right upper extremity  Status post total shoulder replacement, right  Spasm of muscle      Subjective Assessment - 09/13/15 1012    Subjective Patient reports she feels that she is doing very well and feels she is using her right arm just about as much as her left arm. She is comfortable with home program and is seeing MD 09/14/15 and is anticipating discharge to home program.    Patient Stated Goals To be able to  use right arm for personal care and household chores without stiffness or difficulty   Currently in Pain? No/denies        Objective:  AAROM right shoulder forward elevation (supine) 140, ER pre treatment 30 (up to 40 post treatment with active stretching and STM) Palpation: decreased soft tissue elasticity with + moderate spasms palpable in lats and subscapularis and upper trapezius right shoulder Outcome measure; QuickDash: 24% (mild self perceived disability) Strength: right shoulder improving with at least 3+/5 strength for flexion, abduction and ER/IR       [facility] Adult PT Treatment/Exercise -  09/13/15 1233    Exercises   Exercises Other Exercises   Other Exercises  All exercises performed with guidance, verbal cuing, tactile cuing of therapist: PROM/AAROM right shoulder with patient supine lying: performed PROM/AAROM to right shoulder per protocol to 140 forward elevation, ER to 30/40 as tolerated, abduction to 100 degrees,  rhythmic stabilization for rotation and at 90 degrees flexion 2 sets x 10 reps with light resistance given, concentration given to active stretching with resistance to increased ER with patient in supine with hand on forehead 5 reps with 5 second holds with instruction to perform at home, instructed in tubing exercises for scapular adduction and IR/ER with red/green resistance with handouts given        manual therapy: soft tissue mobilization performed to right upper arm/pectoral muscle, subscapularis, lats. And upper trapezius superficial techniques to improve elasticity and decreased pain for exercises  Patient response to treatment: improved soft tissue elasticity and AROM with right arm to forehead with guided motion and repetition, Improved AAROM to 140 degrees flexion, improved motion with active exercises, required demonstration and verbal and tactile cues to perform exercises with right shoulder in good alignment and with proper technique for resistive tubing, Patient demonstrated good technique and verbalized good understanding of home program          PT Education - 09/13/15 1054    Education provided Yes   Education Details home program for resistive band exercises for IR and ER and scapular rowing in standing   Person(s) Educated Patient   Methods Explanation;Demonstration;Verbal cues;Handout   Comprehension Verbalized understanding;Returned demonstration;Verbal cues required             PT Long Term Goals - 09/13/15 1602    PT LONG TERM GOAL #1   Title Patient will demonstrate imrpoved function with decreased pain for daily tasks as  demonstrated by QuickDash score of 35%  or better by 09/03/2015   Baseline QuickDash score = 45% ( current 09/13/2015: 24%)   Status Achieved   PT LONG TERM GOAL #2   Title Patient will imrpove ROM right shoulder to 140 or better by 09/24/2015 to improve function for shoulder level and above in order to wash hair and reach into cabinets   Baseline PROM right shoulder 80 degrees forward elevation with stiffness/pain (Forward elevation 140+, ER 35+)   Status Achieved   PT LONG TERM GOAL #3   Title Patient will demonstrate imrpoved function with decreased pain for daily tasks as demonstrated by QuickDash score of 25%  or better by 09/24/2015   Baseline QuickDash score = 45%  (current 09/13/2015: 24%)   Status Achieved   PT LONG TERM GOAL #4   Title Patient will be independent in home program for pain control, strength and felxibility exercises for continued progression towards full function with right UE by 09/24/2015   Baseline Patient has limited/no knowledge of appropriate exercises or progression  to improve ROM/strength in order to return to full funcitonal use right UE   Status Achieved               Plan - 09-19-15 1210    Clinical Impression Statement Patient has achieved >140 degrees forward elevation, 40 ER and is progressing with decreased pain and improving functional use of right UE with daily activities for personal care and household activities. She reports she feels she is able to continue on own with exercises and improve with home program. She has achieved all goals and is ready to discharge from physical therapy at this time.    Pt will benefit from skilled therapeutic intervention in order to improve on the following deficits Impaired flexibility;Decreased knowledge of precautions;Decreased activity tolerance;Increased muscle spasms;Impaired UE functional use;Decreased range of motion   Rehab Potential Fair   PT Frequency 2x / week   PT Duration 8 weeks   PT  Treatment/Interventions Therapeutic exercise;Electrical Stimulation;Cryotherapy;Moist Heat;Passive range of motion;Scar mobilization;Patient/family education;Neuromuscular re-education;Manual techniques   PT Next Visit Plan discharge from physical          G-Codes - 09-19-15 1604    Functional Assessment Tool Used Quick Dash, ROM and strength deficits, clinical judgment   Functional Limitation Carrying, moving and handling objects   Carrying, Moving and Handling Objects Goal Status (W0981) At least 1 percent but less than 20 percent impaired, limited or restricted   Carrying, Moving and Handling Objects Discharge Status [phone]) At least 20 percent but less than 40 percent impaired, limited or restricted      Problem List Patient Active Problem List   Diagnosis Date Noted  . Urinary urgency 07/29/2015  . Urinary incontinence 07/29/2015  . S/P shoulder replacement 07/15/2015  . Subclinical hypothyroidism 04/20/2015  . Adjustment reaction to medical therapy 02/19/2015  . Arthritis 02/19/2015  . Body mass index 27.0-27.9, adult 02/19/2015  . Alveolar aeration decreased 02/19/2015  . Colon, diverticulosis 02/19/2015  . Gastric ulcer 02/19/2015  . High potassium 02/19/2015  . BP (high blood pressure) 02/19/2015  . Hypertriglyceridemia 02/19/2015  . Arthritis, degenerative 02/19/2015  . OP (osteoporosis) 02/19/2015  . Vitamin D deficiency 02/19/2015  . Aldosterone deficiency (HCC) 10/06/2014  . OA (osteoarthritis) of hip 08/12/2014  . Hyponatremia 11/06/2013  . Postoperative anemia due to acute blood loss 11/06/2013  . OA (osteoarthritis) of knee 10/31/2013    Beacher May PT 2015-09-19, 4:05 PM  [facility] PHYSICAL AND SPORTS MEDICINE [address] [place], [address] Phone: [phone]   Fax:  [phone]  Name: [name] MRN: [id number] Date of Birth: [birth year]

## 2015-09-20 ENCOUNTER — Encounter: Payer: Medicare Other | Admitting: Physical Therapy

## 2015-09-22 ENCOUNTER — Encounter: Payer: Medicare Other | Admitting: Physical Therapy

## 2015-09-24 ENCOUNTER — Encounter: Payer: Medicare Other | Admitting: Physical Therapy

## 2015-09-27 ENCOUNTER — Encounter: Payer: Medicare Other | Admitting: Physical Therapy

## 2015-09-29 ENCOUNTER — Encounter: Payer: Medicare Other | Admitting: Physical Therapy

## 2015-12-21 ENCOUNTER — Other Ambulatory Visit: Payer: Self-pay

## 2015-12-21 ENCOUNTER — Telehealth: Payer: Self-pay | Admitting: Family Medicine

## 2015-12-21 DIAGNOSIS — I10 Essential (primary) hypertension: Secondary | ICD-10-CM

## 2015-12-21 MED ORDER — METOPROLOL TARTRATE 25 MG PO TABS: 25.0000 mg | ORAL_TABLET | Freq: Two times a day (BID) | ORAL | Status: DC

## 2016-03-14 ENCOUNTER — Ambulatory Visit (INDEPENDENT_AMBULATORY_CARE_PROVIDER_SITE_OTHER): Payer: Medicare Other | Admitting: Family Medicine

## 2016-03-14 ENCOUNTER — Encounter: Payer: Self-pay | Admitting: Family Medicine

## 2016-03-14 VITALS — BP 134/82 | HR 76 | Temp 98.1°F | Resp 16 | Wt 160.0 lb

## 2016-03-14 DIAGNOSIS — I10 Essential (primary) hypertension: Secondary | ICD-10-CM

## 2016-03-14 DIAGNOSIS — M1990 Unspecified osteoarthritis, unspecified site: Secondary | ICD-10-CM | POA: Diagnosis not present

## 2016-03-14 MED ORDER — MELOXICAM 7.5 MG PO TABS: 7.5000 mg | ORAL_TABLET | Freq: Two times a day (BID) | ORAL | Status: DC

## 2016-03-14 NOTE — Progress Notes (Signed)
Patient ID: [name], female   DOB: [birth year], 82 y.o.   MRN: [id number]         Patient: [name] Female    DOB: 14-May-1934   82 y.o.   MRN: [id number] Visit Date: 03/14/2016  Today's Provider: Lorie Phenix, MD   Chief Complaint  Patient presents with  . Hypertension  . Arthritis   Subjective:    Arthritis Presents for follow-up visit. The disease course has been worsening (Pt would like to restart Meloxicam; She reports that work will with joint pain. ). She complains of pain, stiffness and joint swelling. Pertinent negatives include no diarrhea, fatigue or fever. Past treatments include NSAIDs. The treatment provided significant relief.  Hypertension This is a chronic problem. The problem is unchanged. The problem is controlled. Pertinent negatives include no anxiety, chest pain, headaches, malaise/fatigue, neck pain, palpitations, peripheral edema, shortness of breath or sweats. There are no compliance problems.        Allergies  Allergen Reactions  . Fleet Liquid Glycerin [Glycerin] Other (See Comments)    Got very sick  . Tramadol Nausea Only  . Morphine And Related Other (See Comments)    Makes me crazy  . Scallops [Shellfish Allergy] Nausea And Vomiting and Other (See Comments)    Able to eat shrimp and other shellfish  . Vicodin [Hydrocodone-Acetaminophen] Nausea Only   Previous Medications   ASCORBIC ACID (VITAMIN C) 1000 MG TABLET    Take 1,000 mg by mouth daily.   ASPIRIN EC 81 MG TABLET    Take 81 mg by mouth 2 (two) times daily.    CALCIUM CARBONATE (CALCIUM 600 PO)    Take 600 mg by mouth daily.   DOXYLAMINE SUCCINATE, SLEEP, (SLEEP AID PO)    Take 1 tablet by mouth at bedtime.   FOLIC ACID 20 MG CAPS    Take 20 mg by mouth daily.    HYDROCHLOROTHIAZIDE (HYDRODIURIL) 25 MG TABLET    TAKE ONE TABLET BY MOUTH ONCE DAILY   LATANOPROST (XALATAN) 0.005 % OPHTHALMIC SOLUTION    Place 1 drop into both eyes at bedtime.   METOPROLOL TARTRATE  (LOPRESSOR) 25 MG TABLET    Take 1 tablet (25 mg total) by mouth 2 (two) times daily.   MULTIPLE VITAMIN (MULTIVITAMIN) CAPSULE    Take 1 capsule by mouth daily.    NIACIN 500 MG TABLET    Take 500 mg by mouth daily.   OMEGA-3 FATTY ACIDS (FISH OIL) 1200 MG CAPS    Take 1,200 mg by mouth daily.   VITAMIN A (CVS VITAMIN A) 10000 UNIT CAPSULE    Take 10,000 Units by mouth daily.    VITAMIN B-12 (CYANOCOBALAMIN) 1000 MCG TABLET    Take 1,000 mcg by mouth daily.    Review of Systems  Constitutional: Negative for fever, chills, malaise/fatigue, diaphoresis, activity change, appetite change, fatigue and unexpected weight change.  Respiratory: Negative.  Negative for shortness of breath.   Cardiovascular: Negative.  Negative for chest pain and palpitations.  Gastrointestinal: Positive for constipation. Negative for nausea, vomiting, abdominal pain, diarrhea, blood in stool, abdominal distention, anal bleeding and rectal pain.  Musculoskeletal: Positive for back pain (In the mornings. ), joint swelling, arthralgias, gait problem, arthritis and stiffness. Negative for myalgias, neck pain and neck stiffness.  Neurological: Positive for weakness, light-headedness (Only occasionally) and numbness. Negative for dizziness, tremors, seizures, syncope, speech difficulty and headaches.    Social History  Substance Use Topics  . Smoking status: Never Smoker   .  Smokeless tobacco: Never Used  . Alcohol Use: Yes     Comment: very rarely   Objective:   BP 134/82 mmHg  Pulse 76  Temp(Src) 98.1 F (36.7 C) (Oral)  Resp 16  Wt 160 lb (72.576 kg)  Physical Exam  Constitutional: She is oriented to person, place, and time. She appears well-developed and well-nourished.  Cardiovascular: Normal rate, regular rhythm and normal heart sounds.   Pulmonary/Chest: Effort normal and breath sounds normal.  Neurological: She is alert and oriented to person, place, and time.  Skin: Skin is warm and dry.  Psychiatric:  She has a normal mood and affect. Her behavior is normal. Judgment and thought content normal.       Assessment & Plan:     1. Essential hypertension Stable; will check labs.  - Comprehensive metabolic panel  2. Osteoarthritis, unspecified osteoarthritis type, unspecified site Recurrent problem. Worsening. Would like try Meloxicam for this. Helped other issues she had previously. Normal kidney function last check. Will recheck today.  - meloxicam (MOBIC) 7.5 MG tablet; Take 1 tablet (7.5 mg total) by mouth 2 (two) times daily. As needed.  Dispense: 180 tablet; Refill: 1  Patient was seen and examined by Leo GrosserNancy J. Inell Mimbs, MD, and note scribed by Kavin LeechLaura Walsh, CMA.   I have reviewed the document for accuracy and completeness and I agree with above. - Leo GrosserNancy J. Genna Casimir, MD      Lorie PhenixNancy Racheal Mathurin, MD  [facility]

## 2016-03-15 ENCOUNTER — Telehealth: Payer: Self-pay

## 2016-03-15 DIAGNOSIS — E875 Hyperkalemia: Secondary | ICD-10-CM | POA: Insufficient documentation

## 2016-03-15 LAB — COMPREHENSIVE METABOLIC PANEL
A/G RATIO: 2 (ref 1.2–2.2)
ALT: 16 IU/L (ref 0–32)
AST: 19 IU/L (ref 0–40)
Albumin: 4.5 g/dL (ref 3.5–4.8)
Alkaline Phosphatase: 82 IU/L (ref 39–117)
BILIRUBIN TOTAL: 0.6 mg/dL (ref 0.0–1.2)
BUN/Creatinine Ratio: 26 (ref 12–28)
BUN: 24 mg/dL (ref 8–27)
CALCIUM: 9.9 mg/dL (ref 8.7–10.3)
CHLORIDE: 99 mmol/L (ref 96–106)
CO2: 25 mmol/L (ref 18–29)
Creatinine, Ser: 0.91 mg/dL (ref 0.57–1.00)
GFR, EST AFRICAN AMERICAN: 69 mL/min/{1.73_m2} (ref >59)
GFR, EST NON AFRICAN AMERICAN: 60 mL/min/{1.73_m2} (ref >59)
GLOBULIN, TOTAL: 2.3 g/dL (ref 1.5–4.5)
Glucose: 84 mg/dL (ref 65–99)
POTASSIUM: 5.3 mmol/L — AB (ref 3.5–5.2)
SODIUM: 142 mmol/L (ref 134–144)
Total Protein: 6.8 g/dL (ref 6.0–8.5)

## 2016-03-15 NOTE — Telephone Encounter (Signed)
Pt returned your call ° °teri °

## 2016-03-15 NOTE — Telephone Encounter (Signed)
Informed pt and placed lab slip up front. Allene DillonEmily Drozdowski, CMA

## 2016-03-15 NOTE — Telephone Encounter (Signed)
LMTCB Emily Drozdowski, CMA  

## 2016-03-15 NOTE — Telephone Encounter (Signed)
-----   Message from Lorie PhenixNancy Maloney, MD sent at 03/15/2016  6:39 AM EDT ----- Labs stable except for high potassium.   Suspect lab error. Please have patient repeat and mark no charge. Thanks.

## 2016-03-18 LAB — COMPREHENSIVE METABOLIC PANEL WITH GFR
ALT: 18 IU/L (ref 0–32)
AST: 19 IU/L (ref 0–40)
Albumin/Globulin Ratio: 2 (ref 1.2–2.2)
Albumin: 4.3 g/dL (ref 3.5–4.8)
Alkaline Phosphatase: 80 IU/L (ref 39–117)
BUN/Creatinine Ratio: 22 (ref 12–28)
BUN: 20 mg/dL (ref 8–27)
Bilirubin Total: 0.6 mg/dL (ref 0.0–1.2)
CO2: 23 mmol/L (ref 18–29)
Calcium: 9.9 mg/dL (ref 8.7–10.3)
Chloride: 97 mmol/L (ref 96–106)
Creatinine, Ser: 0.92 mg/dL (ref 0.57–1.00)
GFR calc Af Amer: 68 mL/min/1.73
GFR calc non Af Amer: 59 mL/min/1.73 — ABNORMAL LOW
Globulin, Total: 2.1 g/dL (ref 1.5–4.5)
Glucose: 86 mg/dL (ref 65–99)
Potassium: 5.1 mmol/L (ref 3.5–5.2)
Sodium: 136 mmol/L (ref 134–144)
Total Protein: 6.4 g/dL (ref 6.0–8.5)

## 2016-03-21 ENCOUNTER — Telehealth: Payer: Self-pay

## 2016-03-21 NOTE — Telephone Encounter (Signed)
Pt advised.   Thanks,   -Laura  

## 2016-03-21 NOTE — Telephone Encounter (Signed)
-----   Message from Lorie PhenixNancy Maloney, MD sent at 03/18/2016  1:54 PM EDT ----- Labs normal. Thanks.

## 2016-03-21 NOTE — Telephone Encounter (Signed)
LMTCB 03/21/2016  Thanks,   -Kemba Hoppes  

## 2016-04-26 ENCOUNTER — Encounter: Payer: Self-pay | Admitting: Physician Assistant

## 2016-04-26 ENCOUNTER — Ambulatory Visit (INDEPENDENT_AMBULATORY_CARE_PROVIDER_SITE_OTHER): Payer: Medicare Other | Admitting: Physician Assistant

## 2016-04-26 VITALS — BP 130/88 | HR 64 | Temp 98.1°F | Resp 16 | Ht 63.0 in | Wt 161.0 lb

## 2016-04-26 DIAGNOSIS — I10 Essential (primary) hypertension: Secondary | ICD-10-CM | POA: Diagnosis not present

## 2016-04-26 DIAGNOSIS — E781 Pure hyperglyceridemia: Secondary | ICD-10-CM

## 2016-04-26 DIAGNOSIS — E559 Vitamin D deficiency, unspecified: Secondary | ICD-10-CM

## 2016-04-26 DIAGNOSIS — E038 Other specified hypothyroidism: Secondary | ICD-10-CM

## 2016-04-26 DIAGNOSIS — Z1239 Encounter for other screening for malignant neoplasm of breast: Secondary | ICD-10-CM

## 2016-04-26 DIAGNOSIS — Z1211 Encounter for screening for malignant neoplasm of colon: Secondary | ICD-10-CM

## 2016-04-26 DIAGNOSIS — E039 Hypothyroidism, unspecified: Secondary | ICD-10-CM

## 2016-04-26 DIAGNOSIS — Z0000 Encounter for general adult medical examination without abnormal findings: Secondary | ICD-10-CM

## 2016-04-26 DIAGNOSIS — E875 Hyperkalemia: Secondary | ICD-10-CM

## 2016-04-26 NOTE — Progress Notes (Signed)
Patient: DELINDA Dodson, Female    DOB: 1934-10-01, 82 y.o.   MRN: [id number] Visit Date: 04/26/2016  Today's Provider: Margaretann Loveless, PA-C   Chief Complaint  Patient presents with  . Medicare Wellness   Subjective:    Annual wellness visit [name] is a 82 y.o. female. She feels well. She reports exercising 3 days a week. She reports she is sleeping well.  04/20/15 AWE 02/18/15 Mammogram-BI-RADS 1 01/19/03 Colonoscopy-diverticulosis, Melonosis, polyp; Dr. Renaee Munda 02/03/14 BMD-WNL -----------------------------------------------------------   Review of Systems  Constitutional: Negative.   HENT: Negative.   Eyes: Negative.   Respiratory: Negative.   Cardiovascular: Negative.   Gastrointestinal: Negative.   Endocrine: Positive for cold intolerance and heat intolerance.  Genitourinary: Positive for enuresis.  Musculoskeletal: Positive for back pain and arthralgias.  Skin: Negative.   Allergic/Immunologic: Negative.   Neurological: Negative.   Hematological: Negative.   Psychiatric/Behavioral: Negative.     Social History   Social History  . Marital Status: Married    Spouse Name: N/A  . Number of Children: N/A  . Years of Education: N/A   Occupational History  . Not on file.   Social History Main Topics  . Smoking status: Never Smoker   . Smokeless tobacco: Never Used  . Alcohol Use: Yes     Comment: very rarely  . Drug Use: No  . Sexual Activity: Not Currently   Other Topics Concern  . Not on file   Social History Narrative    Past Medical History  Diagnosis Date  . PONV (postoperative nausea and vomiting)     severe  . Peptic ulcer disease with hemorrhage 10/13/2008    admitted to hospital .had 4 blood transfusions  . Hypertension   . Neuromuscular disorder (HCC)     left knee  . History of blood transfusion 2009  . Arthritis     "shoulders, knees, right thumb" (08/15/2012)  . Hypothyroidism     not on meds at present. MD  watching  . Anxiety   . Depression   . Chronic kidney disease   . Incontinence      Patient Active Problem List   Diagnosis Date Noted  . Hyperkalemia 03/15/2016  . Urinary urgency 07/29/2015  . Urinary incontinence 07/29/2015  . S/P shoulder replacement 07/15/2015  . Subclinical hypothyroidism 04/20/2015  . Adjustment reaction to medical therapy 02/19/2015  . Arthritis 02/19/2015  . Body mass index 27.0-27.9, adult 02/19/2015  . Alveolar aeration decreased 02/19/2015  . Colon, diverticulosis 02/19/2015  . Gastric ulcer 02/19/2015  . High potassium 02/19/2015  . BP (high blood pressure) 02/19/2015  . Hypertriglyceridemia 02/19/2015  . Arthritis, degenerative 02/19/2015  . OP (osteoporosis) 02/19/2015  . Vitamin D deficiency 02/19/2015  . OA (osteoarthritis) of hip 08/12/2014  . Hyponatremia 11/06/2013  . Postoperative anemia due to acute blood loss 11/06/2013  . OA (osteoarthritis) of knee 10/31/2013    Past Surgical History  Procedure Laterality Date  . Tonsillectomy and adenoidectomy  ~ 1945  . Nasal polyp excision  ~ 1946  . Shoulder surgery Left ~ 2003    "bone spur removed"  . Reverse shoulder arthroplasty  08/15/2012    Procedure: REVERSE SHOULDER ARTHROPLASTY;  Surgeon: Senaida Lange, MD;  [facility]: [facility];  Service: Orthopedics;  Laterality: Left;  . Total knee arthroplasty Right 10/31/2013    Procedure: RIGHT TOTAL KNEE ARTHROPLASTY;  Surgeon: Loanne Drilling, MD;  Location: [facility];  Service: Orthopedics;  Laterality: Right;  .  Injection knee Left 10/31/2013    Procedure: CORTIZONE INJECTION LEFT KNEE ;  Surgeon: Loanne DrillingFrank V Aluisio, MD;  Location: [facility];  Service: Orthopedics;  Laterality: Left;  injected at 1624 under anesthesia  . Total hip arthroplasty Left 08/12/2014    Procedure: LEFT TOTAL HIP ARTHROPLASTY ANTERIOR APPROACH;  Surgeon: Loanne DrillingFrank Aluisio V, MD;  Location: [facility];  Service: Orthopedics;  Laterality: Left;  . Cataract extraction w/ intraocular lens   implant, bilateral Bilateral 3-01/2015  . Joint replacement    . Total shoulder arthroplasty Right 07/15/2015  . Total shoulder arthroplasty Right 07/15/2015    Procedure: RIGHT TOTAL SHOULDER ARTHROPLASTY;  Surgeon: Francena HanlyKevin Supple, MD;  Location: [facility];  Service: Orthopedics;  Laterality: Right;    Her family history includes Alcohol abuse in her father; Arthritis in her mother; CAD in her brother; Diabetes in her son; Hypertension in her brother and mother; Prostate cancer in her brother; Stroke in her brother; Transient ischemic attack in her mother.    No outpatient prescriptions have been marked as taking for the 04/26/16 encounter (Office Visit) with Margaretann LovelessJennifer M Davarius Ridener, PA-C.    Patient Care Team: Margaretann LovelessJennifer M Darlisa Spruiell, PA-C as PCP - General (Family Medicine)    Objective:   Vitals: BP 130/88 mmHg  Pulse 64  Temp(Src) 98.1 F (36.7 C) (Oral)  Resp 16  Ht 5\' 3"  (1.6 m)  Wt 161 lb (73.029 kg)  BMI 28.53 kg/m2  Physical Exam  Constitutional: She is oriented to person, place, and time. She appears well-developed and well-nourished.  HENT:  Head: Normocephalic and atraumatic.  Right Ear: Tympanic membrane, external ear and ear canal normal.  Left Ear: Tympanic membrane, external ear and ear canal normal.  Nose: Nose normal.  Mouth/Throat: Uvula is midline, oropharynx is clear and moist and mucous membranes are normal.  Eyes: Conjunctivae, EOM and lids are normal. Pupils are equal, round, and reactive to light.  Neck: Trachea normal and normal range of motion. Neck supple. Carotid bruit is not present. No thyroid mass and no thyromegaly present.  Cardiovascular: Normal rate, regular rhythm, normal heart sounds and intact distal pulses.   No murmur heard. Pulmonary/Chest: Effort normal and breath sounds normal. No respiratory distress. She has no wheezes.  Abdominal: Soft. Normal appearance and bowel sounds are normal. There is no hepatosplenomegaly. There is no tenderness.    Genitourinary: No breast swelling, tenderness or discharge.  Musculoskeletal: Normal range of motion.  Lymphadenopathy:    She has no cervical adenopathy.    She has no axillary adenopathy.  Neurological: She is alert and oriented to person, place, and time. She has normal strength. No cranial nerve deficit.  Skin: Skin is warm, dry and intact.  Psychiatric: She has a normal mood and affect. Her speech is normal and behavior is normal. Judgment and thought content normal. Cognition and memory are normal.  Vitals reviewed.   Activities of Daily Living In your present state of health, do you have any difficulty performing the following activities: 04/26/2016 07/15/2015  Hearing? Y -  Vision? N -  Difficulty concentrating or making decisions? N -  Walking or climbing stairs? Y -  Dressing or bathing? N -  Doing errands, shopping? N N    Fall Risk Assessment Fall Risk  04/26/2016 04/20/2015  Falls in the past year? No No     Depression Screen PHQ 2/9 Scores 04/26/2016 04/20/2015  PHQ - 2 Score 0 0    Cognitive Testing - 6-CIT  Correct? Score  What year is it? yes 0 0 or 4  What month is it? yes 0 0 or 3  Memorize:    Floyde ParkinsJohn,  [place],  44,  [address] [place],  [place],      What time is it? (within 1 hour) yes 0 0 or 3  Count backwards from 20 yes 0 0, 2, or 4  Name the months of the year yes 0 0, 2, or 4  Repeat name & address above no 4 0, 2, 4, 6, 8, or 10       TOTAL SCORE  4/28   Interpretation:  Normal  Normal (0-7) Abnormal (8-28)       Assessment & Plan:     Annual Wellness Visit  Reviewed patient's Family Medical History Reviewed and updated list of patient's medical providers Assessment of cognitive impairment was done Assessed patient's functional ability Established a written schedule for health screening services Health Risk Assessent Completed and Reviewed  Exercise Activities and Dietary recommendations Goals    None      Immunization History  Administered  Date(s) Administered  . Influenza Split 08/16/2012  . Influenza,inj,Quad PF,36+ Mos 07/16/2015  . Pneumococcal Conjugate-13 01/16/2014     Discussed health benefits of physical activity, and encouraged her to engage in regular exercise appropriate for her age and condition.   1. Medicare annual wellness visit, subsequent  2. Breast cancer screening Breast exam not done today. Mammogram ordered and [facility] given to patient so she may call and schedule at her convenience. - MM Digital Screening; Future  3. Colon cancer screening Patient refuses colonoscopy. Cologuard ordered as below and will f/u pending results. - Cologuard  4. Hyperkalemia H/O this. Most recent lab was WNL. Will recheck as below. - Comprehensive Metabolic Panel (CMET)  5. Vitamin D deficiency H/O this and on supplement. Will check labs and f/u pending lab results. - Vitamin D (25 hydroxy)  6. Hypertriglyceridemia Will check labs and f/u pending results. - Lipid Profile  7. Subclinical hypothyroidism Will check labs as below and f/u pending results. - TSH  8. Essential hypertension Will check labs as below and f/u pending results. - CBC with Differential   ---------------------------------------------------------------------------------------------------------The entirety of the information documented in the History of Present Illness, Review of Systems and Physical Exam were personally obtained by me. Portions of this information were initially documented by Rondel BatonSulibeya Dimas, CMA and reviewed by me for thoroughness and accuracy.     Margaretann LovelessJennifer M Chen Holzman, PA-C  [facility]

## 2016-04-26 NOTE — Patient Instructions (Signed)

## 2016-04-27 ENCOUNTER — Telehealth: Payer: Self-pay | Admitting: Physician Assistant

## 2016-04-27 ENCOUNTER — Telehealth: Payer: Self-pay

## 2016-04-27 LAB — CBC WITH DIFFERENTIAL/PLATELET
BASOS ABS: 0 10*3/uL (ref 0.0–0.2)
Basos: 0 %
EOS (ABSOLUTE): 0.4 10*3/uL (ref 0.0–0.4)
Eos: 5 %
HEMOGLOBIN: 15.4 g/dL (ref 11.1–15.9)
Hematocrit: 45 % (ref 34.0–46.6)
IMMATURE GRANS (ABS): 0 10*3/uL (ref 0.0–0.1)
Immature Granulocytes: 0 %
LYMPHS: 16 %
Lymphocytes Absolute: 1.2 10*3/uL (ref 0.7–3.1)
MCH: 30.4 pg (ref 26.6–33.0)
MCHC: 34.2 g/dL (ref 31.5–35.7)
MCV: 89 fL (ref 79–97)
MONOCYTES: 12 %
Monocytes Absolute: 0.9 10*3/uL (ref 0.1–0.9)
NEUTROS ABS: 5.1 10*3/uL (ref 1.4–7.0)
Neutrophils: 67 %
Platelets: 301 10*3/uL (ref 150–379)
RBC: 5.07 x10E6/uL (ref 3.77–5.28)
RDW: 13 % (ref 12.3–15.4)
WBC: 7.5 10*3/uL (ref 3.4–10.8)

## 2016-04-27 LAB — LIPID PANEL
Chol/HDL Ratio: 3.2 ratio units (ref 0.0–4.4)
Cholesterol, Total: 191 mg/dL (ref 100–199)
HDL: 59 mg/dL (ref >39)
LDL Calculated: 90 mg/dL (ref 0–99)
TRIGLYCERIDES: 208 mg/dL — AB (ref 0–149)
VLDL CHOLESTEROL CAL: 42 mg/dL — AB (ref 5–40)

## 2016-04-27 LAB — COMPREHENSIVE METABOLIC PANEL
ALBUMIN: 4.6 g/dL (ref 3.5–4.8)
ALT: 19 IU/L (ref 0–32)
AST: 22 IU/L (ref 0–40)
Albumin/Globulin Ratio: 1.9 (ref 1.2–2.2)
Alkaline Phosphatase: 102 IU/L (ref 39–117)
BUN / CREAT RATIO: 24 (ref 12–28)
BUN: 20 mg/dL (ref 8–27)
Bilirubin Total: 0.7 mg/dL (ref 0.0–1.2)
CALCIUM: 9.8 mg/dL (ref 8.7–10.3)
CO2: 24 mmol/L (ref 18–29)
CREATININE: 0.85 mg/dL (ref 0.57–1.00)
Chloride: 95 mmol/L — ABNORMAL LOW (ref 96–106)
GFR calc Af Amer: 75 mL/min/{1.73_m2} (ref >59)
GFR calc non Af Amer: 65 mL/min/{1.73_m2} (ref >59)
GLUCOSE: 93 mg/dL (ref 65–99)
Globulin, Total: 2.4 g/dL (ref 1.5–4.5)
Potassium: 5.1 mmol/L (ref 3.5–5.2)
Sodium: 137 mmol/L (ref 134–144)
TOTAL PROTEIN: 7 g/dL (ref 6.0–8.5)

## 2016-04-27 LAB — VITAMIN D 25 HYDROXY (VIT D DEFICIENCY, FRACTURES): Vit D, 25-Hydroxy: 35.8 ng/mL (ref 30.0–100.0)

## 2016-04-27 LAB — TSH: TSH: 4.08 u[IU]/mL (ref 0.450–4.500)

## 2016-04-27 NOTE — Telephone Encounter (Signed)
Patient advised as directed below.  Thanks,  -Joseline 

## 2016-04-27 NOTE — Telephone Encounter (Signed)
Order for cologuard faxed to [facility] °

## 2016-04-27 NOTE — Telephone Encounter (Signed)
-----   Message from Margaretann LovelessJennifer M Burnette, PA-C sent at 04/27/2016 10:12 AM EDT ----- All labs are within normal limits and stable. Triglycerides still elevated but improved from previous labs. Continue to limit fatty foods from diet. Thanks! -JB

## 2016-05-03 LAB — COLOGUARD

## 2016-05-16 ENCOUNTER — Telehealth: Payer: Self-pay

## 2016-05-16 NOTE — Telephone Encounter (Signed)
Called patient Re: Cologuard results-Negative. LMTCB.  Thanks,  -Joseline

## 2016-05-23 NOTE — Telephone Encounter (Signed)
Patient advised of results.  Thanks,  -Joseline R.

## 2016-05-31 NOTE — Telephone Encounter (Signed)
error 

## 2016-07-10 ENCOUNTER — Other Ambulatory Visit: Payer: Self-pay | Admitting: Family Medicine

## 2016-07-10 DIAGNOSIS — I10 Essential (primary) hypertension: Secondary | ICD-10-CM

## 2017-01-08 ENCOUNTER — Other Ambulatory Visit: Payer: Self-pay | Admitting: Physician Assistant

## 2017-01-08 DIAGNOSIS — M1990 Unspecified osteoarthritis, unspecified site: Secondary | ICD-10-CM

## 2017-01-08 MED ORDER — MELOXICAM 7.5 MG PO TABS: 7.5000 mg | ORAL_TABLET | Freq: Every day | ORAL | 1 refills | Status: DC

## 2017-01-08 NOTE — Progress Notes (Signed)
Meloxicam refilled  

## 2017-01-09 ENCOUNTER — Ambulatory Visit (INDEPENDENT_AMBULATORY_CARE_PROVIDER_SITE_OTHER): Payer: Medicare Other | Admitting: Physician Assistant

## 2017-01-09 ENCOUNTER — Encounter: Payer: Self-pay | Admitting: Physician Assistant

## 2017-01-09 VITALS — BP 138/82 | HR 71 | Temp 97.9°F | Resp 20 | Wt 162.0 lb

## 2017-01-09 DIAGNOSIS — J40 Bronchitis, not specified as acute or chronic: Secondary | ICD-10-CM | POA: Diagnosis not present

## 2017-01-09 NOTE — Patient Instructions (Signed)

## 2017-01-09 NOTE — Progress Notes (Signed)
Patient: [name] BillDarlene J Risko Female    DOB: Jan 12, 1934   83 y.o.   MRN: [id number] Visit Date: 01/09/2017  Today's Provider: Margaretann LovelessJennifer M Gaelan Glennon, PA-C   Chief Complaint  Patient presents with  . Bronchitis    FU   Subjective:    HPI     Follow up for Bronchitis  The patient was last seen for this 1 weeks ago at [facility]. Changes made at last visit include adding Prednisone, Levaquin, and Pro Air.  She reports fair compliance with treatment. Pt finished abx and no longer needs inhaler. Pt could not finish Prednisone due to insomnia. She feels that condition is Improved. ------------------------------------------------------------------------------------   Allergies  Allergen Reactions  . Fleet Liquid Glycerin [Glycerin] Other (See Comments)    Got very sick  . Tramadol Nausea Only  . Morphine And Related Other (See Comments)    Makes me crazy  . Prednisone Other (See Comments)    insomnia  . Scallops [Shellfish Allergy] Nausea And Vomiting and Other (See Comments)    Able to eat shrimp and other shellfish  . Vicodin [Hydrocodone-Acetaminophen] Nausea Only     Current Outpatient Prescriptions:  .  Ascorbic Acid (VITAMIN C) 1000 MG tablet, Take 1,000 mg by mouth daily., Disp: , Rfl:  .  aspirin EC 81 MG tablet, Take 81 mg by mouth 2 (two) times daily. , Disp: , Rfl:  .  Calcium Carbonate (CALCIUM 600 PO), Take 600 mg by mouth daily., Disp: , Rfl:  .  Doxylamine Succinate, Sleep, (SLEEP AID PO), Take 1 tablet by mouth at bedtime., Disp: , Rfl:  .  Folic Acid 20 MG CAPS, Take 20 mg by mouth daily. , Disp: , Rfl:  .  hydrochlorothiazide (HYDRODIURIL) 25 MG tablet, TAKE ONE TABLET BY MOUTH ONCE DAILY, Disp: 90 tablet, Rfl: 1 .  latanoprost (XALATAN) 0.005 % ophthalmic solution, Place 1 drop into both eyes at bedtime., Disp: , Rfl:  .  meloxicam (MOBIC) 7.5 MG tablet, Take 1 tablet (7.5 mg total) by mouth daily. As needed., Disp: 90 tablet, Rfl: 1 .   metoprolol tartrate (LOPRESSOR) 25 MG tablet, TAKE ONE TABLET BY MOUTH TWICE DAILY, Disp: 180 tablet, Rfl: 1 .  Multiple Vitamin (MULTIVITAMIN) capsule, Take 1 capsule by mouth daily. , Disp: , Rfl:  .  niacin 500 MG tablet, Take 500 mg by mouth daily., Disp: , Rfl:  .  Omega-3 Fatty Acids (FISH OIL) 1200 MG CAPS, Take 1,200 mg by mouth daily., Disp: , Rfl:  .  vitamin A ([employer]S VITAMIN A) 10000 UNIT capsule, Take 10,000 Units by mouth daily. , Disp: , Rfl:  .  vitamin B-12 (CYANOCOBALAMIN) 1000 MCG tablet, Take 1,000 mcg by mouth daily., Disp: , Rfl:  No current facility-administered medications for this visit.   Facility-Administered Medications Ordered in Other Visits:  .  tranexamic acid (CYKLOKAPRON) 2,000 mg in sodium chloride 0.9 % 50 mL Topical Application, 2,000 mg, Topical, Once, Marriottmber Constable, PA-C  Review of Systems  Constitutional: Positive for fatigue. Negative for activity change, appetite change, chills, diaphoresis, fever and unexpected weight change.  HENT: Negative for congestion, ear pain, postnasal drip, rhinorrhea, sinus pain, sinus pressure, sneezing, sore throat, tinnitus and trouble swallowing.   Respiratory: Positive for cough (dry). Negative for choking, chest tightness, shortness of breath and wheezing.   Cardiovascular: Negative for chest pain, palpitations and leg swelling.  Neurological: Negative for dizziness and headaches.    Social History  Substance  Use Topics  . Smoking status: Never Smoker  . Smokeless tobacco: Never Used  . Alcohol use Yes     Comment: very rarely   Objective:   BP 138/82 (BP Location: Left Arm, Patient Position: Sitting, Cuff Size: Normal)   Pulse 71   Temp 97.9 F (36.6 C) (Oral)   Resp 20   Wt 162 lb (73.5 kg)   SpO2 94%   BMI 28.70 kg/m  Vitals:   01/09/17 1111  BP: 138/82  Pulse: 71  Resp: 20  Temp: 97.9 F (36.6 C)  TempSrc: Oral  SpO2: 94%  Weight: 162 lb (73.5 kg)     Physical Exam  Constitutional: She  appears well-developed and well-nourished. No distress.  HENT:  Head: Normocephalic and atraumatic.  Right Ear: Hearing, tympanic membrane, external ear and ear canal normal.  Left Ear: Hearing, tympanic membrane, external ear and ear canal normal.  Nose: Nose normal.  Mouth/Throat: Uvula is midline, oropharynx is clear and moist and mucous membranes are normal. No oropharyngeal exudate.  Eyes: Conjunctivae are normal. Pupils are equal, round, and reactive to light. Right eye exhibits no discharge. Left eye exhibits no discharge. No scleral icterus.  Neck: Normal range of motion. Neck supple. No tracheal deviation present. No thyromegaly present.  Cardiovascular: Normal rate, regular rhythm and normal heart sounds.  Exam reveals no gallop and no friction rub.   No murmur heard. Pulmonary/Chest: Effort normal and breath sounds normal. No stridor. No respiratory distress. She has no wheezes. She has no rales.  Lymphadenopathy:    She has no cervical adenopathy.  Skin: Skin is warm and dry. She is not diaphoretic.  Vitals reviewed.       Assessment & Plan:     1. Bronchitis Patient came in today following being seen and treated at Urgent Care. She reports feeling about 90% better. She discontinued cough syrup, prednisone and levaquin after taking for about 5 days due to insomnia and upset stomach from medications. She is still using Mucinex OTC for cough and remaining congestion. No wheezing heard on today's exam thus I feel she does not need to use the inhaler any longer as well, but she may continue Mucinex until cough subsides.      Patient seen and examined by Margaretann Loveless, PA-C, and note scribed by Allene Dillon, CMA.  Margaretann Loveless, PA-C  [facility]

## 2017-01-22 ENCOUNTER — Other Ambulatory Visit: Payer: Self-pay | Admitting: Physician Assistant

## 2017-01-22 DIAGNOSIS — I10 Essential (primary) hypertension: Secondary | ICD-10-CM

## 2017-01-29 ENCOUNTER — Other Ambulatory Visit: Payer: Self-pay | Admitting: Physician Assistant

## 2017-01-29 DIAGNOSIS — I10 Essential (primary) hypertension: Secondary | ICD-10-CM

## 2017-04-17 ENCOUNTER — Other Ambulatory Visit: Payer: Self-pay | Admitting: Physician Assistant

## 2017-04-17 DIAGNOSIS — M1990 Unspecified osteoarthritis, unspecified site: Secondary | ICD-10-CM

## 2017-04-17 MED ORDER — MELOXICAM 7.5 MG PO TABS: 7.5000 mg | ORAL_TABLET | Freq: Every day | ORAL | 1 refills | Status: DC

## 2017-04-17 NOTE — Telephone Encounter (Signed)
Last ov 01/09/17 Last filled 01/08/17 Please review. Thank you. sd

## 2017-04-17 NOTE — Telephone Encounter (Signed)
[facility] pharmacy faxed a request for the following medication. Thanks CC   meloxicam (MOBIC) 7.5 MG tablet  >Take one (1) tablet by mouth twice (2) daily as needed.

## 2017-07-19 ENCOUNTER — Ambulatory Visit (INDEPENDENT_AMBULATORY_CARE_PROVIDER_SITE_OTHER): Payer: Medicare Other

## 2017-07-19 VITALS — BP 138/78 | HR 80 | Temp 98.1°F | Resp 16 | Ht 63.0 in | Wt 163.2 lb

## 2017-07-19 DIAGNOSIS — Z23 Encounter for immunization: Secondary | ICD-10-CM | POA: Diagnosis not present

## 2017-07-19 DIAGNOSIS — Z0000 Encounter for general adult medical examination without abnormal findings: Secondary | ICD-10-CM | POA: Diagnosis not present

## 2017-07-19 NOTE — Progress Notes (Signed)
Subjective:   [name] is a 83 y.o. female who presents for Medicare Annual (Subsequent) preventive examination.  Review of Systems:   Cardiac Risk Factors include: advanced age (>35men, >51 women);hypertension     Objective:     Vitals: BP 138/78 (BP Location: Right Arm, Patient Position: Sitting, Cuff Size: Normal)   Pulse 80   Temp 98.1 F (36.7 C) (Oral)   Resp 16   Ht  (1.6 m)   Wt 163 lb 3.2 oz (74 kg)   BMI 28.91 kg/m   Body mass index is 28.91 kg/m.   Tobacco History  Smoking Status  . Never Smoker  Smokeless Tobacco  . Never Used     Counseling given: Not Answered   Past Medical History:  Diagnosis Date  . Anxiety   . Arthritis    "shoulders, knees, right thumb" (08/15/2012)  . Chronic kidney disease   . Depression   . History of blood transfusion 2009  . Hypertension   . Hypothyroidism    not on meds at present. MD watching  . Incontinence   . Neuromuscular disorder (HCC)    left knee  . Peptic ulcer disease with hemorrhage 10/13/2008   admitted to hospital .had 4 blood transfusions  . PONV (postoperative nausea and vomiting)    severe   Past Surgical History:  Procedure Laterality Date  . CATARACT EXTRACTION W/ INTRAOCULAR LENS  IMPLANT, BILATERAL Bilateral 3-01/2015  . INJECTION KNEE Left 10/31/2013   Procedure: CORTIZONE INJECTION LEFT KNEE ;  Surgeon: Loanne Drilling, MD;  Location: [facility];  Service: Orthopedics;  Laterality: Left;  injected at 1624 under anesthesia  . JOINT REPLACEMENT    . NASAL POLYP EXCISION  ~ 1946  . REVERSE SHOULDER ARTHROPLASTY  08/15/2012   Procedure: REVERSE SHOULDER ARTHROPLASTY;  Surgeon: Senaida Lange, MD;  Location: [facility];  Service: Orthopedics;  Laterality: Left;  . SHOULDER SURGERY Left ~ 2003   "bone spur removed"  . TONSILLECTOMY AND ADENOIDECTOMY  ~ 1945  . TOTAL HIP ARTHROPLASTY Left 08/12/2014   Procedure: LEFT TOTAL HIP ARTHROPLASTY ANTERIOR APPROACH;  Surgeon: Loanne Drilling, MD;   Location: [facility];  Service: Orthopedics;  Laterality: Left;  . TOTAL KNEE ARTHROPLASTY Right 10/31/2013   Procedure: RIGHT TOTAL KNEE ARTHROPLASTY;  Surgeon: Loanne Drilling, MD;  Location: [facility];  Service: Orthopedics;  Laterality: Right;  . TOTAL SHOULDER ARTHROPLASTY Right 07/15/2015  . TOTAL SHOULDER ARTHROPLASTY Right 07/15/2015   Procedure: RIGHT TOTAL SHOULDER ARTHROPLASTY;  Surgeon: Francena Hanly, MD;  Location: [facility];  Service: Orthopedics;  Laterality: Right;   Family History  Problem Relation Age of Onset  . Arthritis Mother   . Hypertension Mother   . Transient ischemic attack Mother   . Alcohol abuse Father   . Hypertension Brother   . Stroke Brother   . Prostate cancer Brother   . Diabetes Son   . CAD Brother    History  Sexual Activity  . Sexual activity: Not Currently    Outpatient Encounter Prescriptions as of 07/19/2017  Medication Sig  . aspirin EC 81 MG tablet Take 81 mg by mouth 2 (two) times daily.   . Calcium Carbonate (CALCIUM 600 PO) Take 600 mg by mouth daily.  . Doxylamine Succinate, Sleep, (SLEEP AID PO) Take 1 tablet by mouth at bedtime.  . Folic Acid 20 MG CAPS Take 20 mg by mouth daily.   . hydrochlorothiazide (HYDRODIURIL) 25 MG tablet TAKE ONE TABLET BY MOUTH  ONCE DAILY  . latanoprost (XALATAN) 0.005 % ophthalmic solution Place 1 drop into both eyes at bedtime.  . meloxicam (MOBIC) 7.5 MG tablet Take 1 tablet (7.5 mg total) by mouth daily. As needed.  . metoprolol tartrate (LOPRESSOR) 25 MG tablet TAKE ONE TABLET BY MOUTH TWICE DAILY  . Multiple Vitamin (MULTIVITAMIN) capsule Take 1 capsule by mouth daily.   . niacin 500 MG tablet Take 500 mg by mouth daily.  . Omega-3 Fatty Acids (FISH OIL) 1200 MG CAPS Take 1,200 mg by mouth daily.  . vitamin A (CVS VITAMIN A) 10000 UNIT capsule Take 10,000 Units by mouth daily.   . vitamin B-12 (CYANOCOBALAMIN) 1000 MCG tablet Take 1,000 mcg by mouth daily.  . Ascorbic Acid (VITAMIN C) 1000 MG tablet Take 1,000 mg  by mouth daily.   Facility-Administered Encounter Medications as of 07/19/2017  Medication  . tranexamic acid (CYKLOKAPRON) 2,000 mg in sodium chloride 0.9 % 50 mL Topical Application    Activities of Daily Living In your present state of health, do you have any difficulty performing the following activities: 07/19/2017  Hearing? N  Vision? N  Difficulty concentrating or making decisions? N  Walking or climbing stairs? Y  Comment foot pain and numbness.  Dressing or bathing? N  Doing errands, shopping? N  Preparing Food and eating ? N  Using the Toilet? N  In the past six months, have you accidently leaked urine? Y  Comment has urinary incontinence. Uses sanitary pads  Do you have problems with loss of bowel control? N  Managing your Medications? N  Managing your Finances? N  Housekeeping or managing your Housekeeping? N  Some recent data might be hidden    Patient Care Team: Reine Just as PCP - General (Family Medicine)    Assessment:     Exercise Activities and Dietary recommendations Current Exercise Habits: Home exercise routine, Type of exercise: Other - see comments;strength training/weights (uses stationary bike), Time (Minutes): 20, Frequency (Times/Week): 3, Weekly Exercise (Minutes/Week): 60, Intensity: Mild  Goals    . Increase water intake          Recommend increasing fluid intake to 4-6 glasses every day      Fall Risk Fall Risk  07/19/2017 04/26/2016 04/20/2015  Falls in the past year? Yes No No  Number falls in past yr: 1 - -  Injury with Fall? No - -  Risk for fall due to : Impaired balance/gait;Impaired vision;Medication side effect - -  Risk for fall due to: Comment believes she has "foot drop", toes feeling numb bilaterally, pain in both feet - -  Follow up Education provided;Falls prevention discussed - -   Depression Screen PHQ 2/9 Scores 07/19/2017 04/26/2016 04/20/2015  PHQ - 2 Score 1 0 0     Cognitive Function     6CIT Screen  07/19/2017  What Year? 0 points  What month? 0 points  What time? 0 points  Count back from 20 0 points  Months in reverse 0 points  Repeat phrase 0 points  Total Score 0    Immunization History  Administered Date(s) Administered  . Influenza Split 08/16/2012  . Influenza, High Dose Seasonal PF 07/19/2017  . Influenza,inj,Quad PF,6+ Mos 07/16/2015  . Pneumococcal Conjugate-13 01/16/2014  . Pneumococcal Polysaccharide-23 07/19/2017   Screening Tests Health Maintenance  Topic Date Due  . TETANUS/TDAP  05/05/1953  . INFLUENZA VACCINE  Completed  . DEXA SCAN  Completed  . PNA vac Low Risk Adult  Completed      Plan:   I have personally reviewed and addressed the Medicare Annual Wellness questionnaire and have noted the following in the patient's chart:  A. Medical and social history B. Use of alcohol, tobacco or illicit drugs  C. Current medications and supplements D. Functional ability and status E.  Nutritional status F.  Physical activity G. Advance directives H. List of other physicians I.  Hospitalizations, surgeries, and ER visits in previous 12 months J.  Vitals K. Screenings such as hearing and vision if needed, cognitive and depression L. Referrals and appointments - none  In addition, I have reviewed and discussed with patient certain preventive protocols, quality metrics, and best practice recommendations. A written personalized care plan for preventive services as well as general preventive health recommendations were provided to patient.  See attached scanned questionnaire for additional information.   Signed,  Deon Pilling, LPN Nurse Health Advisor   MD Recommendations:

## 2017-07-19 NOTE — Patient Instructions (Signed)
[name] , Thank you for taking time to come for your Medicare Wellness Visit. I appreciate your ongoing commitment to your health goals. Please review the following plan we discussed and let me know if I can assist you in the future.   Screening recommendations/referrals: Colonoscopy: up to date, Cologuard completed 05/03/16 Mammogram: completed 02/18/15 Bone Density: completed 02/03/14 Recommended yearly ophthalmology/optometry visit for glaucoma screening and checkup Recommended yearly dental visit for hygiene and checkup  Vaccinations: Influenza vaccine: completed today Pneumococcal vaccine: Completed today Tdap vaccine: Declined. Will check with insurance Shingles vaccine: Declined. Will check with insurance    Advanced directives: Advance directive discussed with you today. Even though you declined this today please call our office should you change your mind and we can give you the proper paperwork for you to fill out.  Conditions/risks identified: Recommend increasing fluid intake to 4-6 glasses every day; Fall risk prevention discussed  Next appointment: Follow up with [name] Man, PA on 07/26/17 @ 2:00pm. Follow up in one year for annual wellness visit.   Preventive Care 39 Years and Older, Female Preventive care refers to lifestyle choices and visits with your health care provider that can promote health and wellness. What does preventive care include?  A yearly physical exam. This is also called an annual well check.  Dental exams once or twice a year.  Routine eye exams. Ask your health care provider how often you should have your eyes checked.  Personal lifestyle choices, including:  Daily care of your teeth and gums.  Regular physical activity.  Eating a healthy diet.  Avoiding tobacco and drug use.  Limiting alcohol use.  Practicing safe sex.  Taking low-dose aspirin every day.  Taking vitamin and mineral supplements as recommended by your health  care provider. What happens during an annual well check? The services and screenings done by your health care provider during your annual well check will depend on your age, overall health, lifestyle risk factors, and family history of disease. Counseling  Your health care provider may ask you questions about your:  Alcohol use.  Tobacco use.  Drug use.  Emotional well-being.  Home and relationship well-being.  Sexual activity.  Eating habits.  History of falls.  Memory and ability to understand (cognition).  Work and work Astronomer.  Reproductive health. Screening  You may have the following tests or measurements:  Height, weight, and BMI.  Blood pressure.  Lipid and cholesterol levels. These may be checked every 5 years, or more frequently if you are over 70 years old.  Skin check.  Lung cancer screening. You may have this screening every year starting at age 88 if you have a 30-pack-year history of smoking and currently smoke or have quit within the past 15 years.  Fecal occult blood test (FOBT) of the stool. You may have this test every year starting at age 65.  Flexible sigmoidoscopy or colonoscopy. You may have a sigmoidoscopy every 5 years or a colonoscopy every 10 years starting at age 83.  Hepatitis C blood test.  Hepatitis B blood test.  Sexually transmitted disease (STD) testing.  Diabetes screening. This is done by checking your blood sugar (glucose) after you have not eaten for a while (fasting). You may have this done every 1-3 years.  Bone density scan. This is done to screen for osteoporosis. You may have this done starting at age 69.  Mammogram. This may be done every 1-2 years. Talk to your health care provider about how often  you should have regular mammograms. Talk with your health care provider about your test results, treatment options, and if necessary, the need for more tests. Vaccines  Your health care provider may recommend certain  vaccines, such as:  Influenza vaccine. This is recommended every year.  Tetanus, diphtheria, and acellular pertussis (Tdap, Td) vaccine. You may need a Td booster every 10 years.  Zoster vaccine. You may need this after age 25.  Pneumococcal 13-valent conjugate (PCV13) vaccine. One dose is recommended after age 55.  Pneumococcal polysaccharide (PPSV23) vaccine. One dose is recommended after age 2. Talk to your health care provider about which screenings and vaccines you need and how often you need them. This information is not intended to replace advice given to you by your health care provider. Make sure you discuss any questions you have with your health care provider. Document Released: 11/05/2015 Document Revised: 06/28/2016 Document Reviewed: 08/10/2015 Elsevier Interactive Patient Education  2017 [employer] Prevention in the Home Falls can cause injuries. They can happen to people of all ages. There are many things you can do to make your home safe and to help prevent falls. What can I do on the outside of my home?  Regularly fix the edges of walkways and driveways and fix any cracks.  Remove anything that might make you trip as you walk through a door, such as a raised step or threshold.  Trim any bushes or trees on the path to your home.  Use bright outdoor lighting.  Clear any walking paths of anything that might make someone trip, such as rocks or tools.  Regularly check to see if handrails are loose or broken. Make sure that both sides of any steps have handrails.  Any raised decks and porches should have guardrails on the edges.  Have any leaves, snow, or ice cleared regularly.  Use sand or salt on walking paths during winter.  Clean up any spills in your garage right away. This includes oil or grease spills. What can I do in the bathroom?  Use night lights.  Install grab bars by the toilet and in the tub and shower. Do not use towel bars as grab  bars.  Use non-skid mats or decals in the tub or shower.  If you need to sit down in the shower, use a plastic, non-slip stool.  Keep the floor dry. Clean up any water that spills on the floor as soon as it happens.  Remove soap buildup in the tub or shower regularly.  Attach bath mats securely with double-sided non-slip rug tape.  Do not have throw rugs and other things on the floor that can make you trip. What can I do in the bedroom?  Use night lights.  Make sure that you have a light by your bed that is easy to reach.  Do not use any sheets or blankets that are too big for your bed. They should not hang down onto the floor.  Have a firm chair that has side arms. You can use this for support while you get dressed.  Do not have throw rugs and other things on the floor that can make you trip. What can I do in the kitchen?  Clean up any spills right away.  Avoid walking on wet floors.  Keep items that you use a lot in easy-to-reach places.  If you need to reach something above you, use a strong step stool that has a grab bar.  Keep electrical cords  out of the way.  Do not use floor polish or wax that makes floors slippery. If you must use wax, use non-skid floor wax.  Do not have throw rugs and other things on the floor that can make you trip. What can I do with my stairs?  Do not leave any items on the stairs.  Make sure that there are handrails on both sides of the stairs and use them. Fix handrails that are broken or loose. Make sure that handrails are as long as the stairways.  Check any carpeting to make sure that it is firmly attached to the stairs. Fix any carpet that is loose or worn.  Avoid having throw rugs at the top or bottom of the stairs. If you do have throw rugs, attach them to the floor with carpet tape.  Make sure that you have a light switch at the top of the stairs and the bottom of the stairs. If you do not have them, ask someone to add them for  you. What else can I do to help prevent falls?  Wear shoes that:  Do not have high heels.  Have rubber bottoms.  Are comfortable and fit you well.  Are closed at the toe. Do not wear sandals.  If you use a stepladder:  Make sure that it is fully opened. Do not climb a closed stepladder.  Make sure that both sides of the stepladder are locked into place.  Ask someone to hold it for you, if possible.  Clearly mark and make sure that you can see:  Any grab bars or handrails.  First and last steps.  Where the edge of each step is.  Use tools that help you move around (mobility aids) if they are needed. These include:  Canes.  Walkers.  Scooters.  Crutches.  Turn on the lights when you go into a dark area. Replace any light bulbs as soon as they burn out.  Set up your furniture so you have a clear path. Avoid moving your furniture around.  If any of your floors are uneven, fix them.  If there are any pets around you, be aware of where they are.  Review your medicines with your doctor. Some medicines can make you feel dizzy. This can increase your chance of falling. Ask your doctor what other things that you can do to help prevent falls. This information is not intended to replace advice given to you by your health care provider. Make sure you discuss any questions you have with your health care provider. Document Released: 08/05/2009 Document Revised: 03/16/2016 Document Reviewed: 11/13/2014 Elsevier Interactive Patient Education  2017 [employer].

## 2017-07-26 ENCOUNTER — Encounter: Payer: Self-pay | Admitting: Physician Assistant

## 2017-07-26 ENCOUNTER — Ambulatory Visit (INDEPENDENT_AMBULATORY_CARE_PROVIDER_SITE_OTHER): Payer: Medicare Other | Admitting: Physician Assistant

## 2017-07-26 VITALS — BP 128/80 | HR 72 | Temp 98.1°F | Resp 16 | Ht 63.0 in | Wt 160.0 lb

## 2017-07-26 DIAGNOSIS — E781 Pure hyperglyceridemia: Secondary | ICD-10-CM

## 2017-07-26 DIAGNOSIS — G629 Polyneuropathy, unspecified: Secondary | ICD-10-CM

## 2017-07-26 DIAGNOSIS — I10 Essential (primary) hypertension: Secondary | ICD-10-CM

## 2017-07-26 DIAGNOSIS — I8393 Asymptomatic varicose veins of bilateral lower extremities: Secondary | ICD-10-CM | POA: Insufficient documentation

## 2017-07-26 DIAGNOSIS — Z0000 Encounter for general adult medical examination without abnormal findings: Secondary | ICD-10-CM | POA: Diagnosis not present

## 2017-07-26 DIAGNOSIS — E871 Hypo-osmolality and hyponatremia: Secondary | ICD-10-CM | POA: Diagnosis not present

## 2017-07-26 DIAGNOSIS — E038 Other specified hypothyroidism: Secondary | ICD-10-CM

## 2017-07-26 DIAGNOSIS — E875 Hyperkalemia: Secondary | ICD-10-CM | POA: Diagnosis not present

## 2017-07-26 DIAGNOSIS — E039 Hypothyroidism, unspecified: Secondary | ICD-10-CM

## 2017-07-26 DIAGNOSIS — M170 Bilateral primary osteoarthritis of knee: Secondary | ICD-10-CM

## 2017-07-26 DIAGNOSIS — H6122 Impacted cerumen, left ear: Secondary | ICD-10-CM

## 2017-07-26 DIAGNOSIS — Z6828 Body mass index (BMI) 28.0-28.9, adult: Secondary | ICD-10-CM

## 2017-07-26 NOTE — Patient Instructions (Signed)
Health Maintenance for Postmenopausal Women Menopause is a normal process in which your reproductive ability comes to an end. This process happens gradually over a span of months to years, usually between the ages of 22 and 9. Menopause is complete when you have missed 12 consecutive menstrual periods. It is important to talk with your health care provider about some of the most common conditions that affect postmenopausal women, such as heart disease, cancer, and bone loss (osteoporosis). Adopting a healthy lifestyle and getting preventive care can help to promote your health and wellness. Those actions can also lower your chances of developing some of these common conditions. What should I know about menopause? During menopause, you may experience a number of symptoms, such as:  Moderate-to-severe hot flashes.  Night sweats.  Decrease in sex drive.  Mood swings.  Headaches.  Tiredness.  Irritability.  Memory problems.  Insomnia.  Choosing to treat or not to treat menopausal changes is an individual decision that you make with your health care provider. What should I know about hormone replacement therapy and supplements? Hormone therapy products are effective for treating symptoms that are associated with menopause, such as hot flashes and night sweats. Hormone replacement carries certain risks, especially as you become older. If you are thinking about using estrogen or estrogen with progestin treatments, discuss the benefits and risks with your health care provider. What should I know about heart disease and stroke? Heart disease, heart attack, and stroke become more likely as you age. This may be due, in part, to the hormonal changes that your body experiences during menopause. These can affect how your body processes dietary fats, triglycerides, and cholesterol. Heart attack and stroke are both medical emergencies. There are many things that you can do to help prevent heart disease  and stroke:  Have your blood pressure checked at least every 1-2 years. High blood pressure causes heart disease and increases the risk of stroke.  If you are 53-22 years old, ask your health care provider if you should take aspirin to prevent a heart attack or a stroke.  Do not use any tobacco products, including cigarettes, chewing tobacco, or electronic cigarettes. If you need help quitting, ask your health care provider.  It is important to eat a healthy diet and maintain a healthy weight. ? Be sure to include plenty of vegetables, fruits, low-fat dairy products, and lean protein. ? Avoid eating foods that are high in solid fats, added sugars, or salt (sodium).  Get regular exercise. This is one of the most important things that you can do for your health. ? Try to exercise for at least 150 minutes each week. The type of exercise that you do should increase your heart rate and make you sweat. This is known as moderate-intensity exercise. ? Try to do strengthening exercises at least twice each week. Do these in addition to the moderate-intensity exercise.  Know your numbers.Ask your health care provider to check your cholesterol and your blood glucose. Continue to have your blood tested as directed by your health care provider.  What should I know about cancer screening? There are several types of cancer. Take the following steps to reduce your risk and to catch any cancer development as early as possible. Breast Cancer  Practice breast self-awareness. ? This means understanding how your breasts normally appear and feel. ? It also means doing regular breast self-exams. Let your health care provider know about any changes, no matter how small.  If you are 40  or older, have a clinician do a breast exam (clinical breast exam or CBE) every year. Depending on your age, family history, and medical history, it may be recommended that you also have a yearly breast X-ray (mammogram).  If you  have a family history of breast cancer, talk with your health care provider about genetic screening.  If you are at high risk for breast cancer, talk with your health care provider about having an MRI and a mammogram every year.  Breast cancer (BRCA) gene test is recommended for women who have family members with BRCA-related cancers. Results of the assessment will determine the need for genetic counseling and BRCA1 and for BRCA2 testing. BRCA-related cancers include these types: ? Breast. This occurs in males or females. ? Ovarian. ? Tubal. This may also be called fallopian tube cancer. ? Cancer of the abdominal or pelvic lining (peritoneal cancer). ? Prostate. ? Pancreatic.  Cervical, Uterine, and Ovarian Cancer Your health care provider may recommend that you be screened regularly for cancer of the pelvic organs. These include your ovaries, uterus, and vagina. This screening involves a pelvic exam, which includes checking for microscopic changes to the surface of your cervix (Pap test).  For women ages 21-65, health care providers may recommend a pelvic exam and a Pap test every three years. For women ages 79-65, they may recommend the Pap test and pelvic exam, combined with testing for human papilloma virus (HPV), every five years. Some types of HPV increase your risk of cervical cancer. Testing for HPV may also be done on women of any age who have unclear Pap test results.  Other health care providers may not recommend any screening for nonpregnant women who are considered low risk for pelvic cancer and have no symptoms. Ask your health care provider if a screening pelvic exam is right for you.  If you have had past treatment for cervical cancer or a condition that could lead to cancer, you need Pap tests and screening for cancer for at least 20 years after your treatment. If Pap tests have been discontinued for you, your risk factors (such as having a new sexual partner) need to be  reassessed to determine if you should start having screenings again. Some women have medical problems that increase the chance of getting cervical cancer. In these cases, your health care provider may recommend that you have screening and Pap tests more often.  If you have a family history of uterine cancer or ovarian cancer, talk with your health care provider about genetic screening.  If you have vaginal bleeding after reaching menopause, tell your health care provider.  There are currently no reliable tests available to screen for ovarian cancer.  Lung Cancer Lung cancer screening is recommended for adults 69-62 years old who are at high risk for lung cancer because of a history of smoking. A yearly low-dose CT scan of the lungs is recommended if you:  Currently smoke.  Have a history of at least 30 pack-years of smoking and you currently smoke or have quit within the past 15 years. A pack-year is smoking an average of one pack of cigarettes per day for one year.  Yearly screening should:  Continue until it has been 15 years since you quit.  Stop if you develop a health problem that would prevent you from having lung cancer treatment.  Colorectal Cancer  This type of cancer can be detected and can often be prevented.  Routine colorectal cancer screening usually begins at  age 42 and continues through age 45.  If you have risk factors for colon cancer, your health care provider may recommend that you be screened at an earlier age.  If you have a family history of colorectal cancer, talk with your health care provider about genetic screening.  Your health care provider may also recommend using home test kits to check for hidden blood in your stool.  A small camera at the end of a tube can be used to examine your colon directly (sigmoidoscopy or colonoscopy). This is done to check for the earliest forms of colorectal cancer.  Direct examination of the colon should be repeated every  5-10 years until age 71. However, if early forms of precancerous polyps or small growths are found or if you have a family history or genetic risk for colorectal cancer, you may need to be screened more often.  Skin Cancer  Check your skin from head to toe regularly.  Monitor any moles. Be sure to tell your health care provider: ? About any new moles or changes in moles, especially if there is a change in a mole's shape or color. ? If you have a mole that is larger than the size of a pencil eraser.  If any of your family members has a history of skin cancer, especially at a young age, talk with your health care provider about genetic screening.  Always use sunscreen. Apply sunscreen liberally and repeatedly throughout the day.  Whenever you are outside, protect yourself by wearing long sleeves, pants, a wide-brimmed hat, and sunglasses.  What should I know about osteoporosis? Osteoporosis is a condition in which bone destruction happens more quickly than new bone creation. After menopause, you may be at an increased risk for osteoporosis. To help prevent osteoporosis or the bone fractures that can happen because of osteoporosis, the following is recommended:  If you are 46-71 years old, get at least 1,000 mg of calcium and at least 600 mg of vitamin D per day.  If you are older than age 55 but younger than age 65, get at least 1,200 mg of calcium and at least 600 mg of vitamin D per day.  If you are older than age 54, get at least 1,200 mg of calcium and at least 800 mg of vitamin D per day.  Smoking and excessive alcohol intake increase the risk of osteoporosis. Eat foods that are rich in calcium and vitamin D, and do weight-bearing exercises several times each week as directed by your health care provider. What should I know about how menopause affects my mental health? Depression may occur at any age, but it is more common as you become older. Common symptoms of depression  include:  Low or sad mood.  Changes in sleep patterns.  Changes in appetite or eating patterns.  Feeling an overall lack of motivation or enjoyment of activities that you previously enjoyed.  Frequent crying spells.  Talk with your health care provider if you think that you are experiencing depression. What should I know about immunizations? It is important that you get and maintain your immunizations. These include:  Tetanus, diphtheria, and pertussis (Tdap) booster vaccine.  Influenza every year before the flu season begins.  Pneumonia vaccine.  Shingles vaccine.  Your health care provider may also recommend other immunizations. This information is not intended to replace advice given to you by your health care provider. Make sure you discuss any questions you have with your health care provider. Document Released: 12/01/2005  Document Revised: 04/28/2016 Document Reviewed: 07/13/2015 Elsevier Interactive Patient Education  2018 [employer]  

## 2017-07-26 NOTE — Progress Notes (Signed)
Patient: [name], Female    DOB: Dec 13, 1933, 83 y.o.   MRN: [id number] Visit Date: 07/26/2017  Today's Provider: Margaretann Loveless, PA-C   Chief Complaint  Patient presents with  . Medicare Wellness   Subjective:    Annual wellness visit [name] is a 83 y.o. female. She feels well. She reports exercising 3 days a week. She reports she is sleeping well.  07/19/17 AWV  02/18/15 Mammogram-BI-RADS 1 05/03/16 Cologuard-negative  Patient C/O of pain, numbness, bulging veins on both feet and off balance. She reports that the numbness has been ongoing for a while now. She occasionally has shooting pains, but not often, not daily. Veins have become more prevalent and occasionally cause soreness when they bulge. Sometimes has swelling.   Also has knee OA, bilaterally. Has had knee replacements. Is on meloxicam daily. Recently underwent flexogenix treatments and reports improvement in the left knee after treatments. She mentions that she feels her knees being weak along with the neuropathy make her feel off balance occasionally.  -----------------------------------------------------------   Review of Systems  HENT: Positive for hearing loss.   Eyes: Negative.   Respiratory: Negative.   Cardiovascular: Negative.   Gastrointestinal: Negative.   Endocrine: Positive for cold intolerance.  Genitourinary: Negative.   Musculoskeletal: Positive for gait problem.  Skin: Negative.   Allergic/Immunologic: Negative.   Neurological: Positive for weakness (knees) and numbness (toes). Negative for dizziness, syncope, light-headedness and headaches.  Hematological: Negative.   Psychiatric/Behavioral: Negative.     Social History   Social History  . Marital status: Married    Spouse name: N/A  . Number of children: N/A  . Years of education: N/A   Occupational History  . Not on file.   Social History Main Topics  . Smoking status: Never Smoker  . Smokeless  tobacco: Never Used  . Alcohol use Yes     Comment: very rarely  . Drug use: No  . Sexual activity: Not Currently   Other Topics Concern  . Not on file   Social History Narrative  . No narrative on file    Past Medical History:  Diagnosis Date  . Anxiety   . Arthritis    "shoulders, knees, right thumb" (08/15/2012)  . Chronic kidney disease   . Depression   . History of blood transfusion 2009  . Hypertension   . Hypothyroidism    not on meds at present. MD watching  . Incontinence   . Neuromuscular disorder (HCC)    left knee  . Peptic ulcer disease with hemorrhage 10/13/2008   admitted to hospital .had 4 blood transfusions  . PONV (postoperative nausea and vomiting)    severe     Patient Active Problem List   Diagnosis Date Noted  . Hyperkalemia 03/15/2016  . Urinary urgency 07/29/2015  . Urinary incontinence 07/29/2015  . S/P shoulder replacement 07/15/2015  . Subclinical hypothyroidism 04/20/2015  . Adjustment reaction to medical therapy 02/19/2015  . Body mass index 27.0-27.9, adult 02/19/2015  . Alveolar aeration decreased 02/19/2015  . Colon, diverticulosis 02/19/2015  . Gastric ulcer 02/19/2015  . BP (high blood pressure) 02/19/2015  . Hypertriglyceridemia 02/19/2015  . Arthritis, degenerative 02/19/2015  . OP (osteoporosis) 02/19/2015  . Vitamin D deficiency 02/19/2015  . OA (osteoarthritis) of hip 08/12/2014  . Hyponatremia 11/06/2013  . OA (osteoarthritis) of knee 10/31/2013    Past Surgical History:  Procedure Laterality Date  . CATARACT EXTRACTION W/ INTRAOCULAR LENS  IMPLANT, BILATERAL  Bilateral 3-01/2015  . INJECTION KNEE Left 10/31/2013   Procedure: CORTIZONE INJECTION LEFT KNEE ;  Surgeon: Loanne Drilling, MD;  Location: [facility];  Service: Orthopedics;  Laterality: Left;  injected at 1624 under anesthesia  . JOINT REPLACEMENT    . NASAL POLYP EXCISION  ~ 1946  . REVERSE SHOULDER ARTHROPLASTY  08/15/2012   Procedure: REVERSE SHOULDER  ARTHROPLASTY;  Surgeon: Senaida Lange, MD;  Location: [facility];  Service: Orthopedics;  Laterality: Left;  . SHOULDER SURGERY Left ~ 2003   "bone spur removed"  . TONSILLECTOMY AND ADENOIDECTOMY  ~ 1945  . TOTAL HIP ARTHROPLASTY Left 08/12/2014   Procedure: LEFT TOTAL HIP ARTHROPLASTY ANTERIOR APPROACH;  Surgeon: Loanne Drilling, MD;  Location: [facility];  Service: Orthopedics;  Laterality: Left;  . TOTAL KNEE ARTHROPLASTY Right 10/31/2013   Procedure: RIGHT TOTAL KNEE ARTHROPLASTY;  Surgeon: Loanne Drilling, MD;  Location: [facility];  Service: Orthopedics;  Laterality: Right;  . TOTAL SHOULDER ARTHROPLASTY Right 07/15/2015  . TOTAL SHOULDER ARTHROPLASTY Right 07/15/2015   Procedure: RIGHT TOTAL SHOULDER ARTHROPLASTY;  Surgeon: Francena Hanly, MD;  Location: [facility];  Service: Orthopedics;  Laterality: Right;    Her family history includes Alcohol abuse in her father; Arthritis in her mother; CAD in her brother; Diabetes in her son; Hypertension in her brother and mother; Prostate cancer in her brother; Stroke in her brother; Transient ischemic attack in her mother.      Current Outpatient Prescriptions:  .  Ascorbic Acid (VITAMIN C) 1000 MG tablet, Take 1,000 mg by mouth daily., Disp: , Rfl:  .  aspirin EC 81 MG tablet, Take 81 mg by mouth 2 (two) times daily. , Disp: , Rfl:  .  Calcium Carbonate (CALCIUM 600 PO), Take 600 mg by mouth daily., Disp: , Rfl:  .  Doxylamine Succinate, Sleep, (SLEEP AID PO), Take 1 tablet by mouth at bedtime., Disp: , Rfl:  .  Folic Acid 20 MG CAPS, Take 20 mg by mouth daily. , Disp: , Rfl:  .  hydrochlorothiazide (HYDRODIURIL) 25 MG tablet, TAKE ONE TABLET BY MOUTH ONCE DAILY, Disp: 90 tablet, Rfl: 1 .  latanoprost (XALATAN) 0.005 % ophthalmic solution, Place 1 drop into both eyes at bedtime., Disp: , Rfl:  .  meloxicam (MOBIC) 7.5 MG tablet, Take 1 tablet (7.5 mg total) by mouth daily. As needed., Disp: 90 tablet, Rfl: 1 .  metoprolol tartrate (LOPRESSOR) 25 MG tablet, TAKE  ONE TABLET BY MOUTH TWICE DAILY, Disp: 180 tablet, Rfl: 1 .  Multiple Vitamin (MULTIVITAMIN) capsule, Take 1 capsule by mouth daily. , Disp: , Rfl:  .  niacin 500 MG tablet, Take 500 mg by mouth daily., Disp: , Rfl:  .  Omega-3 Fatty Acids (FISH OIL) 1200 MG CAPS, Take 1,200 mg by mouth daily., Disp: , Rfl:  .  vitamin A (CVS VITAMIN A) 10000 UNIT capsule, Take 10,000 Units by mouth daily. , Disp: , Rfl:  .  vitamin B-12 (CYANOCOBALAMIN) 1000 MCG tablet, Take 1,000 mcg by mouth daily., Disp: , Rfl:  No current facility-administered medications for this visit.   Facility-Administered Medications Ordered in Other Visits:  .  tranexamic acid (CYKLOKAPRON) 2,000 mg in sodium chloride 0.9 % 50 mL Topical Application, 2,000 mg, Topical, Once, Dimitri Ped, PA-C  Patient Care Team: Margaretann Loveless, PA-C as PCP - General (Family Medicine)     Objective:   Vitals: BP 128/80 (BP Location: Left Arm, Patient Position: Sitting, Cuff Size: Large)   Pulse 72  Temp 98.1 F (36.7 C) (Oral)   Resp 16   Ht  (1.6 m)   Wt 160 lb (72.6 kg)   SpO2 96%   BMI 28.34 kg/m   Physical Exam  Constitutional: She is oriented to person, place, and time. She appears well-developed and well-nourished. No distress.  HENT:  Head: Normocephalic and atraumatic.  Right Ear: Hearing, tympanic membrane, external ear and ear canal normal.  Left Ear: Hearing, tympanic membrane, external ear and ear canal normal.  Nose: Nose normal.  Mouth/Throat: Uvula is midline, oropharynx is clear and moist and mucous membranes are normal. No oropharyngeal exudate.  Cerumen impaction noted in left ear. Ear lavage performed and was successful. Left TM visualized and normal.  Eyes: Pupils are equal, round, and reactive to light. Conjunctivae and EOM are normal. Right eye exhibits no discharge. Left eye exhibits no discharge. No scleral icterus.  Neck: Normal range of motion. Neck supple. No JVD present. No tracheal  deviation present. No thyromegaly present.  Cardiovascular: Normal rate, regular rhythm, normal heart sounds and intact distal pulses.  Exam reveals no gallop and no friction rub.   No murmur heard. Pulses:      Dorsalis pedis pulses are 2+ on the right side, and 2+ on the left side.       Posterior tibial pulses are 2+ on the right side, and 2+ on the left side.  Cap refill 3-5 seconds all toes  Pulmonary/Chest: Effort normal and breath sounds normal. No respiratory distress. She has no wheezes. She has no rales. She exhibits no tenderness.  Abdominal: Soft. Bowel sounds are normal. She exhibits no distension and no mass. There is no tenderness. There is no rebound and no guarding.  Musculoskeletal: Normal range of motion. She exhibits edema (trace pitting edema bilaterally). She exhibits no tenderness.  Lymphadenopathy:    She has no cervical adenopathy.  Neurological: She is alert and oriented to person, place, and time.  Skin: Skin is warm and dry. No rash noted. She is not diaphoretic.     Psychiatric: She has a normal mood and affect. Her behavior is normal. Judgment and thought content normal.  Vitals reviewed.   Activities of Daily Living In your present state of health, do you have any difficulty performing the following activities: 07/19/2017  Hearing? N  Vision? N  Difficulty concentrating or making decisions? N  Walking or climbing stairs? Y  Comment foot pain and numbness.  Dressing or bathing? N  Doing errands, shopping? N  Preparing Food and eating ? N  Using the Toilet? N  In the past six months, have you accidently leaked urine? Y  Comment has urinary incontinence. Uses sanitary pads  Do you have problems with loss of bowel control? N  Managing your Medications? N  Managing your Finances? N  Housekeeping or managing your Housekeeping? N  Some recent data might be hidden    Fall Risk Assessment Fall Risk  07/19/2017 04/26/2016 04/20/2015  Falls in the past year?  Yes No No  Number falls in past yr: 1 - -  Injury with Fall? No - -  Risk for fall due to : Impaired balance/gait;Impaired vision;Medication side effect - -  Risk for fall due to: Comment believes she has "foot drop", toes feeling numb bilaterally, pain in both feet - -  Follow up Education provided;Falls prevention discussed - -     Depression Screen PHQ 2/9 Scores 07/19/2017 07/19/2017 04/26/2016 04/20/2015  PHQ - 2 Score 2 1  0 0  PHQ- 9 Score 3 - - -        Assessment & Plan:     Annual Wellness Visit  Reviewed patient's Family Medical History Reviewed and updated list of patient's medical providers Assessment of cognitive impairment was done Assessed patient's functional ability Established a written schedule for health screening services Health Risk Assessent Completed and Reviewed  Exercise Activities and Dietary recommendations Goals    . Increase water intake          Recommend increasing fluid intake to 4-6 glasses every day       Immunization History  Administered Date(s) Administered  . Influenza Split 08/16/2012  . Influenza, High Dose Seasonal PF 07/19/2017  . Influenza,inj,Quad PF,6+ Mos 07/16/2015  . Pneumococcal Conjugate-13 01/16/2014  . Pneumococcal Polysaccharide-23 07/19/2017    Health Maintenance  Topic Date Due  . TETANUS/TDAP  05/05/1955  . INFLUENZA VACCINE  Completed  . DEXA SCAN  Completed  . PNA vac Low Risk Adult  Completed     Discussed health benefits of physical activity, and encouraged her to engage in regular exercise appropriate for her age and condition.    1. Annual physical exam Normal physical exam for age. Patient does not want to have mammograms any longer. She will continue self breast exams.   2. Essential hypertension Stable. Continue HCTZ  and metoprolol  daily. Will check labs as below and f/u pending results. - CBC w/Diff/Platelet - COMPLETE METABOLIC PANEL WITH GFR - HgB W0J  3. Subclinical  hypothyroidism Still asymptomatic. Will check labs as below and f/u pending results. - TSH  4. Primary osteoarthritis of both knees Stable. Discussed PT for gait stability but patient does not feel she has time currently for PT due to her husband being sick currently. May address once he is better.   5. Hyponatremia Will check labs as below and f/u pending results. May need to stop HCTZ if still low.  - COMPLETE METABOLIC PANEL WITH GFR  6. Hypertriglyceridemia Will check labs as below and f/u pending results. - COMPLETE METABOLIC PANEL WITH GFR - Lipid Profile - HgB A1c  7. Hyperkalemia Will check labs as below and f/u pending results. - COMPLETE METABOLIC PANEL WITH GFR  8. BMI 28.0-28.9,adult Counseled patient on healthy lifestyle modifications including dieting and exercise.  Discussed PT for gait stability but she declines at this time.  - Lipid Profile - HgB A1c  9. Impacted cerumen of left ear Lavage successful.  - Ear Lavage  10. Neuropathy Not causing pain at this time. Checks her feet nightly. May consider adding gabapentin in the future if symptoms worsen.   11. Spider veins of both lower extremities Discussed wearing compression stockings to prevent edema and bulging of the veins. Tylenol prn for pain.   ------------------------------------------------------------------------------------------------------------    Margaretann Loveless, PA-C  [facility]

## 2017-07-28 LAB — COMPLETE METABOLIC PANEL WITH GFR
AG Ratio: 1.8 (calc) (ref 1.0–2.5)
ALBUMIN MSPROF: 4.4 g/dL (ref 3.6–5.1)
ALKALINE PHOSPHATASE (APISO): 77 U/L (ref 33–130)
ALT: 15 U/L (ref 6–29)
AST: 19 U/L (ref 10–35)
BUN / CREAT RATIO: 20 (calc) (ref 6–22)
BUN: 21 mg/dL (ref 7–25)
CALCIUM: 10 mg/dL (ref 8.6–10.4)
CO2: 29 mmol/L (ref 20–32)
CREATININE: 1.07 mg/dL — AB (ref 0.60–0.88)
Chloride: 101 mmol/L (ref 98–110)
GFR, Est African American: 56 mL/min/{1.73_m2} — ABNORMAL LOW (ref >=60)
GFR, Est Non African American: 49 mL/min/{1.73_m2} — ABNORMAL LOW (ref >=60)
GLOBULIN: 2.4 g/dL (ref 1.9–3.7)
GLUCOSE: 96 mg/dL (ref 65–99)
Potassium: 5.5 mmol/L — ABNORMAL HIGH (ref 3.5–5.3)
SODIUM: 141 mmol/L (ref 135–146)
Total Bilirubin: 0.6 mg/dL (ref 0.2–1.2)
Total Protein: 6.8 g/dL (ref 6.1–8.1)

## 2017-07-28 LAB — CBC WITH DIFFERENTIAL/PLATELET
Basophils Absolute: 31 cells/uL (ref 0–200)
Basophils Relative: 0.4 %
EOS PCT: 4.1 %
Eosinophils Absolute: 320 cells/uL (ref 15–500)
HEMATOCRIT: 46.9 % — AB (ref 35.0–45.0)
HEMOGLOBIN: 15.5 g/dL (ref 11.7–15.5)
LYMPHS ABS: 1201 {cells}/uL (ref 850–3900)
MCH: 28.9 pg (ref 27.0–33.0)
MCHC: 33 g/dL (ref 32.0–36.0)
MCV: 87.5 fL (ref 80.0–100.0)
MONOS PCT: 10.2 %
MPV: 10 fL (ref 7.5–12.5)
NEUTROS ABS: 5452 {cells}/uL (ref 1500–7800)
Neutrophils Relative %: 69.9 %
Platelets: 284 10*3/uL (ref 140–400)
RBC: 5.36 10*6/uL — AB (ref 3.80–5.10)
RDW: 12.7 % (ref 11.0–15.0)
Total Lymphocyte: 15.4 %
WBC mixed population: 796 cells/uL (ref 200–950)
WBC: 7.8 10*3/uL (ref 3.8–10.8)

## 2017-07-28 LAB — LIPID PANEL
CHOL/HDL RATIO: 3 (calc) (ref <5.0)
CHOLESTEROL: 183 mg/dL (ref <200)
HDL: 62 mg/dL (ref >50)
LDL CHOLESTEROL (CALC): 92 mg/dL
Non-HDL Cholesterol (Calc): 121 mg/dL (calc) (ref <130)
TRIGLYCERIDES: 193 mg/dL — AB (ref <150)

## 2017-07-28 LAB — HEMOGLOBIN A1C
HEMOGLOBIN A1C: 5.8 %{Hb} — AB (ref <5.7)
MEAN PLASMA GLUCOSE: 120 (calc)
eAG (mmol/L): 6.6 (calc)

## 2017-07-28 LAB — TSH: TSH: 4.1 m[IU]/L (ref 0.40–4.50)

## 2017-07-30 ENCOUNTER — Telehealth: Payer: Self-pay

## 2017-07-30 NOTE — Telephone Encounter (Signed)
-----   Message from Margaretann Loveless, [place] sent at 07/30/2017 10:22 AM EDT ----- Renal function slightly decreased from last year, suspect due to dehydration with fasting for labs. Push fluids and stay well hydrated. Potassium is borderline elevated. Would recommend holding multi-vitamin and any other potassium supplements. Cholesterol stable. A1c stable and borderline elevated at 5.8. Continue working on healthy lifestyle modifications.

## 2017-07-30 NOTE — Telephone Encounter (Signed)
Patient advised as below. Patient verbalizes understanding and is in agreement with treatment plan.  

## 2017-08-06 ENCOUNTER — Other Ambulatory Visit: Payer: Self-pay | Admitting: Physician Assistant

## 2017-08-06 DIAGNOSIS — M1990 Unspecified osteoarthritis, unspecified site: Secondary | ICD-10-CM

## 2017-08-31 ENCOUNTER — Telehealth: Payer: Self-pay | Admitting: Physician Assistant

## 2017-08-31 DIAGNOSIS — I10 Essential (primary) hypertension: Secondary | ICD-10-CM

## 2017-08-31 MED ORDER — METOPROLOL TARTRATE 25 MG PO TABS: 25.0000 mg | ORAL_TABLET | Freq: Two times a day (BID) | ORAL | 1 refills | Status: DC

## 2017-08-31 MED ORDER — HYDROCHLOROTHIAZIDE 25 MG PO TABS: 25.0000 mg | ORAL_TABLET | Freq: Every day | ORAL | 1 refills | Status: DC

## 2017-08-31 NOTE — Telephone Encounter (Signed)
Patient needs a refill on the following medications.  metoprolol tartrate (LOPRESSOR) 25 MG tablet and hydrochlorothiazide (HYDRODIURIL) 25 MG tablet.  [place] these sent to [facility] there.  [place] address is [address] [place].  The store number is [phone] and the fax number is [phone]. She has 3 pills left of each.

## 2017-08-31 NOTE — Telephone Encounter (Signed)
Sent!

## 2017-08-31 NOTE — Telephone Encounter (Signed)
Please Review.  Thanks,  -Wen Munford 

## 2018-03-14 ENCOUNTER — Other Ambulatory Visit: Payer: Self-pay | Admitting: Physician Assistant

## 2018-03-14 DIAGNOSIS — I10 Essential (primary) hypertension: Secondary | ICD-10-CM

## 2018-04-10 ENCOUNTER — Other Ambulatory Visit: Payer: Self-pay | Admitting: Physician Assistant

## 2018-04-10 DIAGNOSIS — I10 Essential (primary) hypertension: Secondary | ICD-10-CM

## 2018-05-03 ENCOUNTER — Encounter: Payer: Self-pay | Admitting: Physician Assistant

## 2018-05-03 ENCOUNTER — Ambulatory Visit (INDEPENDENT_AMBULATORY_CARE_PROVIDER_SITE_OTHER): Payer: Medicare Other | Admitting: Physician Assistant

## 2018-05-03 VITALS — BP 132/80 | HR 65 | Temp 98.4°F | Resp 16 | Wt 163.8 lb

## 2018-05-03 DIAGNOSIS — R7309 Other abnormal glucose: Secondary | ICD-10-CM

## 2018-05-03 DIAGNOSIS — E039 Hypothyroidism, unspecified: Secondary | ICD-10-CM

## 2018-05-03 DIAGNOSIS — M1990 Unspecified osteoarthritis, unspecified site: Secondary | ICD-10-CM | POA: Diagnosis not present

## 2018-05-03 DIAGNOSIS — M810 Age-related osteoporosis without current pathological fracture: Secondary | ICD-10-CM

## 2018-05-03 DIAGNOSIS — E559 Vitamin D deficiency, unspecified: Secondary | ICD-10-CM | POA: Diagnosis not present

## 2018-05-03 DIAGNOSIS — E871 Hypo-osmolality and hyponatremia: Secondary | ICD-10-CM | POA: Diagnosis not present

## 2018-05-03 DIAGNOSIS — Z6829 Body mass index (BMI) 29.0-29.9, adult: Secondary | ICD-10-CM | POA: Diagnosis not present

## 2018-05-03 DIAGNOSIS — I10 Essential (primary) hypertension: Secondary | ICD-10-CM

## 2018-05-03 DIAGNOSIS — E781 Pure hyperglyceridemia: Secondary | ICD-10-CM

## 2018-05-03 DIAGNOSIS — E038 Other specified hypothyroidism: Secondary | ICD-10-CM

## 2018-05-03 DIAGNOSIS — E875 Hyperkalemia: Secondary | ICD-10-CM | POA: Diagnosis not present

## 2018-05-03 MED ORDER — METOPROLOL TARTRATE 25 MG PO TABS
ORAL_TABLET | ORAL | 1 refills | Status: DC
Start: 2018-05-03 — End: 2019-02-05

## 2018-05-03 MED ORDER — MELOXICAM 7.5 MG PO TABS: 7.5000 mg | ORAL_TABLET | Freq: Every day | ORAL | 1 refills | Status: AC | PRN

## 2018-05-03 MED ORDER — MELOXICAM 7.5 MG PO TABS: 7.5000 mg | ORAL_TABLET | Freq: Every day | ORAL | 1 refills | Status: DC | PRN

## 2018-05-03 MED ORDER — HYDROCHLOROTHIAZIDE 25 MG PO TABS: 25.0000 mg | ORAL_TABLET | Freq: Every day | ORAL | 1 refills | Status: DC

## 2018-05-03 NOTE — Progress Notes (Signed)
Patient: [name] Female    DOB: 1933-12-31   84 y.o.   MRN: [id number] Visit Date: 05/03/2018  Today's Provider: Margaretann Loveless, PA-C   Chief Complaint  Patient presents with  . Follow-up    HTN    Subjective:    HPI Patient here for medications refill.she reports she wants to update on her medications because [place] is [place] out of [place]. [place] is [place] to [facility] with their youngest son.    Hypertension, follow-up:  BP Readings from Last 3 Encounters:  05/03/18 132/80  07/26/17 128/80  07/19/17 138/78    She was last seen for hypertension 8 months ago.  BP at that visit was 128/80. Management since that visit includes none. Continue HCTZ 25mg  and Metoprolol 25mg  daily. She reports excellent compliance with treatment. She is not having side effects.  She is exercising. She is adherent to low salt diet.   Outside blood pressures are n/a. She is experiencing none.  Patient denies chest pain, chest pressure/discomfort, dyspnea, exertional chest pressure/discomfort, fatigue, irregular heart beat, lower extremity edema and near-syncope.   Cardiovascular risk factors include advanced age (older than 45 for men, 21 for women) and hypertension.  Use of agents associated with hypertension: none.    Weight trend: stable Wt Readings from Last 3 Encounters:  05/03/18 163 lb 12.8 oz (74.3 kg)  07/26/17 160 lb (72.6 kg)  07/19/17 163 lb 3.2 oz (74 kg)    Current diet: in general, a "healthy" diet    ------------------------------------------------------------------------ Osteoarthritis: patient takes Meloxicam.Stable.     Allergies  Allergen Reactions  . Fleet Liquid Glycerin [Glycerin] Other (See Comments)    Got very sick  . Tramadol Nausea Only  . Morphine And Related Other (See Comments)    Makes me crazy  . Prednisone Other (See Comments)    insomnia  . Scallops [Shellfish Allergy] Nausea And Vomiting and Other (See Comments)    Able to  eat shrimp and other shellfish  . Vicodin [Hydrocodone-Acetaminophen] Nausea Only     Current Outpatient Medications:  .  Ascorbic Acid (VITAMIN C) 1000 MG tablet, Take 1,000 mg by mouth daily., Disp: , Rfl:  .  aspirin EC 81 MG tablet, Take 81 mg by mouth 2 (two) times daily. , Disp: , Rfl:  .  Calcium Carbonate (CALCIUM 600 PO), Take 600 mg by mouth daily., Disp: , Rfl:  .  Doxylamine Succinate, Sleep, (SLEEP AID PO), Take 1 tablet by mouth at bedtime., Disp: , Rfl:  .  Folic Acid 20 MG CAPS, Take 20 mg by mouth daily. , Disp: , Rfl:  .  hydrochlorothiazide (HYDRODIURIL) 25 MG tablet, TAKE 1 TABLET BY MOUTH ONCE DAILY, Disp: 90 tablet, Rfl: 1 .  latanoprost (XALATAN) 0.005 % ophthalmic solution, Place 1 drop into both eyes at bedtime., Disp: , Rfl:  .  meloxicam (MOBIC) 7.5 MG tablet, TAKE 1 TABLET BY MOUTH ONCE DAILY AS NEEDED, Disp: 90 tablet, Rfl: 1 .  metoprolol tartrate (LOPRESSOR) 25 MG tablet, TAKE 1 TABLET (25 MG TOTAL) BY MOUTH TWICE DAILY, Disp: 180 tablet, Rfl: 1 .  Multiple Vitamin (MULTIVITAMIN) capsule, Take 1 capsule by mouth daily. , Disp: , Rfl:  .  niacin 500 MG tablet, Take 500 mg by mouth daily., Disp: , Rfl:  .  Omega-3 Fatty Acids (FISH OIL) 1200 MG CAPS, Take 1,200 mg by mouth daily., Disp: , Rfl:  .  vitamin A (CVS VITAMIN A) 10000 UNIT  capsule, Take 10,000 Units by mouth daily. , Disp: , Rfl:  .  vitamin B-12 (CYANOCOBALAMIN) 1000 MCG tablet, Take 1,000 mcg by mouth daily., Disp: , Rfl:  No current facility-administered medications for this visit.   Facility-Administered Medications Ordered in Other Visits:  .  tranexamic acid (CYKLOKAPRON) 2,000 mg in sodium chloride 0.9 % 50 mL Topical Application, 2,000 mg, Topical, Once, Celedonio Savage, [facility], PA-C  Review of Systems  Constitutional: Negative.   Respiratory: Negative.   Cardiovascular: Negative.   Gastrointestinal: Negative.   Musculoskeletal: Negative.   Neurological: Negative.   Psychiatric/Behavioral:  Negative.     Social History   Tobacco Use  . Smoking status: Never Smoker  . Smokeless tobacco: Never Used  Substance Use Topics  . Alcohol use: Yes    Comment: very rarely   Objective:   BP 132/80 (BP Location: Left Arm, Patient Position: Sitting, Cuff Size: Normal)   Pulse 65   Temp 98.4 F (36.9 C) (Oral)   Resp 16   Wt 163 lb 12.8 oz (74.3 kg)   BMI 29.02 kg/m  Vitals:   05/03/18 1047  BP: 132/80  Pulse: 65  Resp: 16  Temp: 98.4 F (36.9 C)  TempSrc: Oral  Weight: 163 lb 12.8 oz (74.3 kg)     Physical Exam  Constitutional: She appears well-developed and well-nourished. No distress.  Neck: Normal range of motion. Neck supple.  Cardiovascular: Normal rate, regular rhythm and normal heart sounds. Exam reveals no gallop and no friction rub.  No murmur heard. Pulmonary/Chest: Effort normal and breath sounds normal. No respiratory distress. She has no wheezes. She has no rales.  Musculoskeletal: Normal range of motion. She exhibits no edema.  Skin: She is not diaphoretic.  Psychiatric: She has a normal mood and affect. Her behavior is normal. Judgment and thought content normal.  Vitals reviewed.      Assessment & Plan:     1. Essential hypertension Stable. Diagnosis pulled for medication refill. Continue current medical treatment plan. Will check labs as below and f/u pending results. - CBC w/Diff/Platelet - Comprehensive Metabolic Panel (CMET) - Lipid Profile - metoprolol tartrate (LOPRESSOR) 25 MG tablet; TAKE 1 TABLET (25 MG TOTAL) BY MOUTH TWICE DAILY  Dispense: 180 tablet; Refill: 1 - hydrochlorothiazide (HYDRODIURIL) 25 MG tablet; Take 1 tablet (25 mg total) by mouth daily.  Dispense: 90 tablet; Refill: 1  2. Subclinical hypothyroidism Stable. Continue current medical treatment plan. Will check labs as below and f/u pending results. - CBC w/Diff/Platelet - TSH  3. Age-related osteoporosis without current pathological fracture On OTC supplement. Will  check labs as below and f/u pending results. - Vitamin D (25 hydroxy)  4. Hyperkalemia H/O this. Will check labs as below and f/u pending results. - Comprehensive Metabolic Panel (CMET)  5. Hypertriglyceridemia Diet controlled. Will check labs as below and f/u pending results. - Comprehensive Metabolic Panel (CMET) - Lipid Profile  6. Hyponatremia H/O this. Will check labs as below and f/u pending results. - Comprehensive Metabolic Panel (CMET)  7. Vitamin D deficiency On OTC supplementation. Will check labs as below and f/u pending results. - CBC w/Diff/Platelet - Vitamin D (25 hydroxy)  8. Osteoarthritis, unspecified osteoarthritis type, unspecified site Stable. Diagnosis pulled for medication refill. Continue current medical treatment plan. - meloxicam (MOBIC) 7.5 MG tablet; Take 1 tablet (7.5 mg total) by mouth daily as needed.  Dispense: 90 tablet; Refill: 1  9. Elevated hemoglobin A1c Diet controlled. Will check labs as below and f/u pending results. -  HgB A1c - Comprehensive Metabolic Panel (CMET) - Lipid Profile  10. BMI 29.0-29.9,adult Counseled patient on healthy lifestyle modifications including dieting and exercise.        Margaretann LovelessJennifer M Sundae Maners, PA-C  [facility]

## 2018-05-03 NOTE — Patient Instructions (Signed)
DASH Eating Plan DASH stands for "Dietary Approaches to Stop Hypertension." The DASH eating plan is a healthy eating plan that has been shown to reduce high blood pressure (hypertension). It may also reduce your risk for type 2 diabetes, heart disease, and stroke. The DASH eating plan may also help with weight loss. What are tips for following this plan? General guidelines  Avoid eating more than 2,300 mg (milligrams) of salt (sodium) a day. If you have hypertension, you may need to reduce your sodium intake to 1,500 mg a day.  Limit alcohol intake to no more than 1 drink a day for nonpregnant women and 2 drinks a day for men. One drink equals 12 oz of beer, 5 oz of wine, or 1 oz of hard liquor.  Work with your health care provider to maintain a healthy body weight or to lose weight. Ask what an ideal weight is for you.  Get at least 30 minutes of exercise that causes your heart to beat faster (aerobic exercise) most days of the week. Activities may include walking, swimming, or biking.  Work with your health care provider or diet and nutrition specialist (dietitian) to adjust your eating plan to your individual calorie needs. Reading food labels  Check food labels for the amount of sodium per serving. Choose foods with less than 5 percent of the Daily Value of sodium. Generally, foods with less than 300 mg of sodium per serving fit into this eating plan.  To find whole grains, look for the word "whole" as the first word in the ingredient list. Shopping  Buy products labeled as "low-sodium" or "no salt added."  Buy fresh foods. Avoid canned foods and premade or frozen meals. Cooking  Avoid adding salt when cooking. Use salt-free seasonings or herbs instead of table salt or sea salt. Check with your health care provider or pharmacist before using salt substitutes.  Do not fry foods. Cook foods using healthy methods such as baking, boiling, grilling, and broiling instead.  Cook with  heart-healthy oils, such as olive, canola, soybean, or sunflower oil. Meal planning   Eat a balanced diet that includes: ? 5 or more servings of fruits and vegetables each day. At each meal, try to fill half of your plate with fruits and vegetables. ? Up to 6-8 servings of whole grains each day. ? Less than 6 oz of lean meat, poultry, or fish each day. A 3-oz serving of meat is about the same size as a deck of cards. One egg equals 1 oz. ? 2 servings of low-fat dairy each day. ? A serving of nuts, seeds, or beans 5 times each week. ? Heart-healthy fats. Healthy fats called Omega-3 fatty acids are found in foods such as flaxseeds and coldwater fish, like sardines, salmon, and mackerel.  Limit how much you eat of the following: ? Canned or prepackaged foods. ? Food that is high in trans fat, such as fried foods. ? Food that is high in saturated fat, such as fatty meat. ? Sweets, desserts, sugary drinks, and other foods with added sugar. ? Full-fat dairy products.  Do not salt foods before eating.  Try to eat at least 2 vegetarian meals each week.  Eat more home-cooked food and less restaurant, buffet, and fast food.  When eating at a restaurant, ask that your food be prepared with less salt or no salt, if possible. What foods are recommended? The items listed may not be a complete list. Talk with your dietitian about what   dietary choices are best for you. Grains Whole-grain or whole-wheat bread. Whole-grain or whole-wheat pasta. Brown rice. Oatmeal. Quinoa. Bulgur. Whole-grain and low-sodium cereals. Pita bread. Low-fat, low-sodium crackers. Whole-wheat flour tortillas. Vegetables Fresh or frozen vegetables (raw, steamed, roasted, or grilled). Low-sodium or reduced-sodium tomato and vegetable juice. Low-sodium or reduced-sodium tomato sauce and tomato paste. Low-sodium or reduced-sodium canned vegetables. Fruits All fresh, dried, or frozen fruit. Canned fruit in natural juice (without  added sugar). Meat and other protein foods Skinless chicken or turkey. Ground chicken or turkey. Pork with fat trimmed off. Fish and seafood. Egg whites. Dried beans, peas, or lentils. Unsalted nuts, nut butters, and seeds. Unsalted canned beans. Lean cuts of beef with fat trimmed off. Low-sodium, lean deli meat. Dairy Low-fat (1%) or fat-free (skim) milk. Fat-free, low-fat, or reduced-fat cheeses. Nonfat, low-sodium ricotta or cottage cheese. Low-fat or nonfat yogurt. Low-fat, low-sodium cheese. Fats and oils Soft margarine without trans fats. Vegetable oil. Low-fat, reduced-fat, or light mayonnaise and salad dressings (reduced-sodium). Canola, safflower, olive, soybean, and sunflower oils. Avocado. Seasoning and other foods Herbs. Spices. Seasoning mixes without salt. Unsalted popcorn and pretzels. Fat-free sweets. What foods are not recommended? The items listed may not be a complete list. Talk with your dietitian about what dietary choices are best for you. Grains Baked goods made with fat, such as croissants, muffins, or some breads. Dry pasta or rice meal packs. Vegetables Creamed or fried vegetables. Vegetables in a cheese sauce. Regular canned vegetables (not low-sodium or reduced-sodium). Regular canned tomato sauce and paste (not low-sodium or reduced-sodium). Regular tomato and vegetable juice (not low-sodium or reduced-sodium). Pickles. Olives. Fruits Canned fruit in a light or heavy syrup. Fried fruit. Fruit in cream or butter sauce. Meat and other protein foods Fatty cuts of meat. Ribs. Fried meat. Bacon. Sausage. Bologna and other processed lunch meats. Salami. Fatback. Hotdogs. Bratwurst. Salted nuts and seeds. Canned beans with added salt. Canned or smoked fish. Whole eggs or egg yolks. Chicken or turkey with skin. Dairy Whole or 2% milk, cream, and half-and-half. Whole or full-fat cream cheese. Whole-fat or sweetened yogurt. Full-fat cheese. Nondairy creamers. Whipped toppings.  Processed cheese and cheese spreads. Fats and oils Butter. Stick margarine. Lard. Shortening. Ghee. Bacon fat. Tropical oils, such as coconut, palm kernel, or palm oil. Seasoning and other foods Salted popcorn and pretzels. Onion salt, garlic salt, seasoned salt, table salt, and sea salt. Worcestershire sauce. Tartar sauce. Barbecue sauce. Teriyaki sauce. Soy sauce, including reduced-sodium. Steak sauce. Canned and packaged gravies. Fish sauce. Oyster sauce. Cocktail sauce. Horseradish that you find on the shelf. Ketchup. Mustard. Meat flavorings and tenderizers. Bouillon cubes. Hot sauce and Tabasco sauce. Premade or packaged marinades. Premade or packaged taco seasonings. Relishes. Regular salad dressings. Where to find more information:  National Heart, Lung, and [employer] Institute: www.nhlbi.nih.gov  American Heart Association: www.heart.org Summary  The DASH eating plan is a healthy eating plan that has been shown to reduce high blood pressure (hypertension). It may also reduce your risk for type 2 diabetes, heart disease, and stroke.  With the DASH eating plan, you should limit salt (sodium) intake to 2,300 mg a day. If you have hypertension, you may need to reduce your sodium intake to 1,500 mg a day.  When on the DASH eating plan, aim to eat more fresh fruits and vegetables, whole grains, lean proteins, low-fat dairy, and heart-healthy fats.  Work with your health care provider or diet and nutrition specialist (dietitian) to adjust your eating plan to your individual   calorie needs. This information is not intended to replace advice given to you by your health care provider. Make sure you discuss any questions you have with your health care provider. Document Released: 09/28/2011 Document Revised: 10/02/2016 Document Reviewed: 10/02/2016 [employer] Interactive Patient Education  2018 [employer]  

## 2018-05-08 LAB — COMPREHENSIVE METABOLIC PANEL
ALT: 20 IU/L (ref 0–32)
AST: 22 IU/L (ref 0–40)
Albumin/Globulin Ratio: 2 (ref 1.2–2.2)
Albumin: 4.5 g/dL (ref 3.5–4.7)
Alkaline Phosphatase: 78 IU/L (ref 39–117)
BUN / CREAT RATIO: 23 (ref 12–28)
BUN: 22 mg/dL (ref 8–27)
Bilirubin Total: 0.6 mg/dL (ref 0.0–1.2)
CALCIUM: 10 mg/dL (ref 8.7–10.3)
CO2: 23 mmol/L (ref 20–29)
CREATININE: 0.97 mg/dL (ref 0.57–1.00)
Chloride: 101 mmol/L (ref 96–106)
GFR calc non Af Amer: 55 mL/min/{1.73_m2} — ABNORMAL LOW (ref >59)
GFR, EST AFRICAN AMERICAN: 63 mL/min/{1.73_m2} (ref >59)
GLUCOSE: 92 mg/dL (ref 65–99)
Globulin, Total: 2.3 g/dL (ref 1.5–4.5)
Potassium: 5.3 mmol/L — ABNORMAL HIGH (ref 3.5–5.2)
Sodium: 139 mmol/L (ref 134–144)
TOTAL PROTEIN: 6.8 g/dL (ref 6.0–8.5)

## 2018-05-08 LAB — VITAMIN D 25 HYDROXY (VIT D DEFICIENCY, FRACTURES): Vit D, 25-Hydroxy: 31.6 ng/mL (ref 30.0–100.0)

## 2018-05-08 LAB — CBC WITH DIFFERENTIAL/PLATELET
BASOS ABS: 0 10*3/uL (ref 0.0–0.2)
Basos: 0 %
EOS (ABSOLUTE): 0.3 10*3/uL (ref 0.0–0.4)
Eos: 5 %
Hematocrit: 46.9 % — ABNORMAL HIGH (ref 34.0–46.6)
Hemoglobin: 15.6 g/dL (ref 11.1–15.9)
IMMATURE GRANS (ABS): 0 10*3/uL (ref 0.0–0.1)
IMMATURE GRANULOCYTES: 0 %
LYMPHS: 17 %
Lymphocytes Absolute: 1 10*3/uL (ref 0.7–3.1)
MCH: 30.2 pg (ref 26.6–33.0)
MCHC: 33.3 g/dL (ref 31.5–35.7)
MCV: 91 fL (ref 79–97)
MONOCYTES: 12 %
Monocytes Absolute: 0.7 10*3/uL (ref 0.1–0.9)
NEUTROS ABS: 4 10*3/uL (ref 1.4–7.0)
NEUTROS PCT: 66 %
Platelets: 287 10*3/uL (ref 150–450)
RBC: 5.16 x10E6/uL (ref 3.77–5.28)
RDW: 14 % (ref 12.3–15.4)
WBC: 6.1 10*3/uL (ref 3.4–10.8)

## 2018-05-08 LAB — LIPID PANEL
CHOL/HDL RATIO: 3.3 ratio (ref 0.0–4.4)
Cholesterol, Total: 194 mg/dL (ref 100–199)
HDL: 58 mg/dL (ref >39)
LDL Calculated: 97 mg/dL (ref 0–99)
TRIGLYCERIDES: 194 mg/dL — AB (ref 0–149)
VLDL CHOLESTEROL CAL: 39 mg/dL (ref 5–40)

## 2018-05-08 LAB — HEMOGLOBIN A1C
ESTIMATED AVERAGE GLUCOSE: 123 mg/dL
Hgb A1c MFr Bld: 5.9 % — ABNORMAL HIGH (ref 4.8–5.6)

## 2018-05-08 LAB — TSH: TSH: 5.48 u[IU]/mL — ABNORMAL HIGH (ref 0.450–4.500)

## 2018-06-13 ENCOUNTER — Telehealth: Payer: Self-pay

## 2018-06-13 NOTE — Telephone Encounter (Signed)
LMTCB- pt needs to schedule her AWV for 07/2018. -MM

## 2018-06-20 NOTE — Telephone Encounter (Signed)
Pt states [place] have moved to [place] and will no continue her care here.  -MM

## 2019-02-05 ENCOUNTER — Other Ambulatory Visit: Payer: Self-pay | Admitting: Physician Assistant

## 2019-02-05 DIAGNOSIS — I10 Essential (primary) hypertension: Secondary | ICD-10-CM

## 2019-02-05 NOTE — Telephone Encounter (Signed)
LOV was 05/03/2018

## 2019-05-01 ENCOUNTER — Other Ambulatory Visit: Payer: Self-pay | Admitting: Physician Assistant

## 2019-05-01 DIAGNOSIS — I10 Essential (primary) hypertension: Secondary | ICD-10-CM

## 2019-08-25 ENCOUNTER — Other Ambulatory Visit: Payer: Self-pay | Admitting: Physician Assistant

## 2019-08-25 DIAGNOSIS — I10 Essential (primary) hypertension: Secondary | ICD-10-CM

## 2019-09-01 ENCOUNTER — Other Ambulatory Visit: Payer: Self-pay | Admitting: Physician Assistant

## 2019-09-01 DIAGNOSIS — I10 Essential (primary) hypertension: Secondary | ICD-10-CM

## 2019-11-28 ENCOUNTER — Other Ambulatory Visit: Payer: Self-pay | Admitting: Physician Assistant

## 2019-11-28 DIAGNOSIS — I10 Essential (primary) hypertension: Secondary | ICD-10-CM
